# Patient Record
Sex: Female | Born: 1935 | Race: Black or African American | Hispanic: No | Smoking: Former smoker
Health system: Southern US, Community
[De-identification: ages and names within clinical notes are randomized; demographics above are authoritative.]

## PROBLEM LIST (undated history)

## (undated) DIAGNOSIS — Z789 Other specified health status: Secondary | ICD-10-CM

## (undated) DIAGNOSIS — Q600 Renal agenesis, unilateral: Secondary | ICD-10-CM

## (undated) DIAGNOSIS — J449 Chronic obstructive pulmonary disease, unspecified: Secondary | ICD-10-CM

## (undated) DIAGNOSIS — I509 Heart failure, unspecified: Secondary | ICD-10-CM

## (undated) DIAGNOSIS — I10 Essential (primary) hypertension: Secondary | ICD-10-CM

## (undated) DIAGNOSIS — M1990 Unspecified osteoarthritis, unspecified site: Secondary | ICD-10-CM

## (undated) DIAGNOSIS — I4891 Unspecified atrial fibrillation: Secondary | ICD-10-CM

## (undated) DIAGNOSIS — I272 Pulmonary hypertension, unspecified: Secondary | ICD-10-CM

## (undated) DIAGNOSIS — K219 Gastro-esophageal reflux disease without esophagitis: Secondary | ICD-10-CM

## (undated) HISTORY — DX: Unspecified atrial fibrillation: I48.91

## (undated) HISTORY — DX: Chronic obstructive pulmonary disease, unspecified: J44.9

## (undated) HISTORY — PX: ANKLE FRACTURE SURGERY: SHX122

## (undated) HISTORY — DX: Pulmonary hypertension, unspecified: I27.20

## (undated) HISTORY — PX: TOTAL KNEE ARTHROPLASTY: SHX125

## (undated) HISTORY — PX: TOTAL ABDOMINAL HYSTERECTOMY: SHX209

## (undated) HISTORY — DX: Morbid (severe) obesity due to excess calories: E66.01

## (undated) HISTORY — DX: Heart failure, unspecified: I50.9

## (undated) HISTORY — DX: Other specified health status: Z78.9

## (undated) HISTORY — DX: Unspecified osteoarthritis, unspecified site: M19.90

## (undated) HISTORY — DX: Gastro-esophageal reflux disease without esophagitis: K21.9

## (undated) HISTORY — DX: Essential (primary) hypertension: I10

---

## 2010-01-15 ENCOUNTER — Encounter: Payer: Self-pay | Admitting: Emergency Medicine

## 2010-01-15 ENCOUNTER — Encounter: Payer: Self-pay | Admitting: Internal Medicine

## 2010-01-17 ENCOUNTER — Encounter: Payer: Self-pay | Admitting: Emergency Medicine

## 2010-02-21 ENCOUNTER — Encounter: Payer: Self-pay | Admitting: Internal Medicine

## 2010-03-14 DIAGNOSIS — J449 Chronic obstructive pulmonary disease, unspecified: Secondary | ICD-10-CM

## 2010-03-14 DIAGNOSIS — I4891 Unspecified atrial fibrillation: Secondary | ICD-10-CM

## 2010-03-14 DIAGNOSIS — K219 Gastro-esophageal reflux disease without esophagitis: Secondary | ICD-10-CM

## 2010-03-14 DIAGNOSIS — I2789 Other specified pulmonary heart diseases: Secondary | ICD-10-CM

## 2010-03-14 DIAGNOSIS — I509 Heart failure, unspecified: Secondary | ICD-10-CM | POA: Insufficient documentation

## 2010-03-14 DIAGNOSIS — J4489 Other specified chronic obstructive pulmonary disease: Secondary | ICD-10-CM | POA: Insufficient documentation

## 2010-03-15 ENCOUNTER — Ambulatory Visit: Payer: Self-pay | Admitting: Emergency Medicine

## 2010-03-15 DIAGNOSIS — I10 Essential (primary) hypertension: Secondary | ICD-10-CM | POA: Insufficient documentation

## 2010-03-15 DIAGNOSIS — M129 Arthropathy, unspecified: Secondary | ICD-10-CM | POA: Insufficient documentation

## 2010-03-15 DIAGNOSIS — T7840XA Allergy, unspecified, initial encounter: Secondary | ICD-10-CM | POA: Insufficient documentation

## 2010-03-18 DIAGNOSIS — R609 Edema, unspecified: Secondary | ICD-10-CM | POA: Insufficient documentation

## 2010-03-19 ENCOUNTER — Ambulatory Visit: Payer: Self-pay | Admitting: Internal Medicine

## 2010-03-21 ENCOUNTER — Ambulatory Visit: Payer: Self-pay | Admitting: Internal Medicine

## 2010-03-21 ENCOUNTER — Ambulatory Visit: Payer: Self-pay | Admitting: Cardiology

## 2010-03-21 ENCOUNTER — Ambulatory Visit (HOSPITAL_COMMUNITY): Admission: RE | Admit: 2010-03-21 | Discharge: 2010-03-21 | Payer: Self-pay | Admitting: Internal Medicine

## 2010-03-21 ENCOUNTER — Ambulatory Visit: Payer: Self-pay

## 2010-03-21 ENCOUNTER — Encounter: Payer: Self-pay | Admitting: Internal Medicine

## 2010-03-22 ENCOUNTER — Telehealth: Payer: Self-pay | Admitting: Internal Medicine

## 2010-04-17 ENCOUNTER — Ambulatory Visit: Payer: Self-pay | Admitting: Emergency Medicine

## 2010-04-26 ENCOUNTER — Ambulatory Visit: Payer: Self-pay | Admitting: Internal Medicine

## 2010-07-29 ENCOUNTER — Ambulatory Visit (HOSPITAL_COMMUNITY): Admission: RE | Admit: 2010-07-29 | Discharge: 2010-07-29 | Payer: Self-pay | Admitting: Internal Medicine

## 2010-11-20 ENCOUNTER — Ambulatory Visit: Payer: Self-pay | Admitting: Internal Medicine

## 2010-11-20 DIAGNOSIS — I5032 Chronic diastolic (congestive) heart failure: Secondary | ICD-10-CM

## 2010-11-28 ENCOUNTER — Ambulatory Visit: Payer: Self-pay | Admitting: Internal Medicine

## 2010-11-28 LAB — CONVERTED CEMR LAB
BUN: 24 mg/dL — ABNORMAL HIGH (ref 6–23)
Calcium: 9 mg/dL (ref 8.4–10.5)
Creatinine, Ser: 1.6 mg/dL — ABNORMAL HIGH (ref 0.4–1.2)
GFR calc non Af Amer: 39.04 mL/min (ref >60)

## 2011-01-02 ENCOUNTER — Encounter: Payer: Self-pay | Admitting: Internal Medicine

## 2011-01-02 LAB — CONVERTED CEMR LAB
ALT: 26 units/L
Albumin: 3.9 g/dL
BUN: 59 mg/dL
Glucose, Bld: 91 mg/dL
HDL: 56 mg/dL
Total Bilirubin: 0.6 mg/dL
Total Protein: 8.2 g/dL

## 2011-01-03 ENCOUNTER — Encounter: Payer: Self-pay | Admitting: Internal Medicine

## 2011-01-21 NOTE — Progress Notes (Signed)
Summary: switch meds  Phone Note From Pharmacy Call back at [phone]   Caller: Stephaine/[facility]  [address] Summary of Call: cardizem cost about $ 300.00 want to switch to taztia xt 350mg  $50.00 Initial call taken by: Migdalia Dk,  March 22, 2010 10:39 AM  Follow-up for Phone Call        spoke with stephanie at [facility], okay given for pt to change to taztia.Deliah Goody, RN  March 22, 2010 11:45 AM     New/Updated Medications: TAZTIA XT 360 MG XR24H-CAP (DILTIAZEM HCL ER BEADS) one tablet by mouth once daily Prescriptions: TAZTIA XT 360 MG XR24H-CAP (DILTIAZEM HCL ER BEADS) one tablet by mouth once daily  #30 x 12   Entered by:   Deliah Goody, RN   Authorized by:   Dolores Patty, MD, [facility]   Signed by:   Deliah Goody, RN on 03/22/2010   Method used:   Electronically to        [facility] [phone]* (retail)       [address]       [place], [place]  [address]       Ph: [phone]       Fax: [phone]   RxID:   [id number]

## 2011-01-21 NOTE — Assessment & Plan Note (Signed)
Summary: 1 month rov/sl   Visit Type:   Follow-up Referring Provider:  Dr. Ronne Binning Primary Provider:  Dr Thayer Headings   History of Present Illness: Ms Grosch is a 77yo woman with a hx of A Fib, HTN and morbid obesity. Recently moved to [place] in January. Referred for evaluation of CHF and poorly controlled HTN.  Saw Dr. Delton Coombes recently. He felt like this was likely secondary pulmonary HTN due to weight and possible COPD. Offered her sleep study but she refused.   Echo EF 55-60% Mild-Mod MR. Mod-severe TR PAP 67.   BP has been in 130-140s when home nurse checks it. Says her breathing is OK but activity is limited. Recently moved from a wheelchair to walker.  No palpitations or CP. No syncope or presyncope. Frequent edema. 1-2 + orthopnea.(chronic)  No PND. No bleeidng with coumadin.    Current Medications (verified): 1)  Coumadin 5 Mg Tabs (Warfarin Sodium) .... As Directed 2)  Lasix 40 Mg Tabs (Furosemide) .Marland Kitchen.. 1 By Mouth Daily 3)  Multivitamins  Tabs (Multiple Vitamin) .Marland Kitchen.. 1 By Mouth Daily 4)  Stool Softener 100 Mg Caps (Docusate Sodium) .... Take 1 Capsule By Mouth Once A Day 5)  Ferrous Sulfate 325 (65 Fe) Mg Tabs (Ferrous Sulfate) .... Take 1 Tablet By Mouth Two Times A Day 6)  Clonidine Hcl 0.2 Mg Tabs (Clonidine Hcl) .... Take 1 Tablet By Mouth Two Times A Day 7)  Santyl 250 Unit/gm Oint (Collagenase) .... Apply As Directed 8)  Taztia Xt 360 Mg Xr24h-Cap (Diltiazem Hcl Er Beads) .... One Tablet By Mouth Once Daily  Allergies (verified): 1)  ! Asa  Vital Signs:  Patient profile:   77 year old female Height:      62 inches Weight:      218 pounds BMI:     40.02 Pulse rate:   108 / minute BP sitting:   164 / 100  (left arm)  Vitals Entered By: Laurance Flatten CMA (Apr 26, 2010 3:08 PM)  Physical Exam  General:  Obese woman sitting in W-C. No acute distress. HEENT: normal Neck: supple. thick. JVP 6. Carotids 2+ bilat; no bruits. No lymphadenopathy or thryomegaly  appreciated. Cor: PMI nonopalpable. Irregular tachycardic No rubs, gallops, murmur. Lungs: clear Abdomen: obese soft, nontender, nondistended.  Good bowel sounds. Extremities: no cyanosis, clubbing, ras. 1+ edema Neuro: alert & orientedx3, cranial nerves grossly intact. moves all 4 extremities w/o difficulty. affect pleasant    Impression & Recommendations:  Problem # 1:  ATRIAL FIBRILLATION (ICD-427.31) HR still up. Will add Lopressor 25 mg two times a day. Continue coumadin.  Problem # 2:  HYPERTENSION (ICD-401.9) BP elevated. Adding lopressor.   Problem # 3:  PULMONARY HYPERTENSION (ICD-416.8) Agree with Dr. Delton Coombes this is almost certainly 2ndary pHTN. Refuses sleep study. Needs to lose weight.   Other Orders: EKG w/ Interpretation (93000)  Patient Instructions: 1)  Start Lopressor 25mg  two times a day  2)  Follow up in 6 months Prescriptions: METOPROLOL TARTRATE 25 MG TABS (METOPROLOL TARTRATE) Take one tablet by mouth twice a day  #60 x 6   Entered by:   Meredith Staggers, RN   Authorized by:   Dolores Patty, MD, [facility]   Signed by:   Meredith Staggers, RN on 04/26/2010   Method used:   Electronically to        [facility] [phone]* (retail)       [address]  [place], [place]  [address]       Ph: [phone]       Fax: [phone]   RxID:   [id number]

## 2011-01-21 NOTE — Letter (Signed)
Summary: Pt's med hx/[facility]  Pt's med hx/[facility]   Imported By: Sherian Rein 03/21/2010 13:03:08  _____________________________________________________________________  External Attachment:    Type:   Image     Comment:   External Document

## 2011-01-21 NOTE — Miscellaneous (Signed)
Summary: Orders Update pft charges  Clinical Lists Changes  Orders: Added new Service order of Carbon Monoxide diffusing w/capacity (94720) - Signed Added new Service order of Lung Volumes (94240) - Signed Added new Service order of Spirometry w/Graph (94010) - Signed 

## 2011-01-21 NOTE — Assessment & Plan Note (Signed)
Summary: NP6/ CHF / HTN/ PT HAS MEDICARE/ GD   Referring Provider:  Dr. Ronne Binning Primary Provider:  Dr Thayer Headings  CC:  past Hx of Afib.  History of Present Illness: Desiree Pierce is a 77yo woman with a hx of A Fib, HTN and morbid obesity. Recently moved to [place] in January. Referred for evaluation of CHF and poorly controlled HTN.  Broke her ankle in October 2010 after she slipped and has essentially been in wheelchair Is able to get around a bit at home without W-C. Denies any h/o known heart disease until she was diagnosed with atrial fibrillation in November 2010. Started on coumadin. Had echo at that time. Was told she had pulmonary HTN and strated on Revatio. Never had RHC.  Saw Dr. Delton Coombes recently. He felt like this was likely secondary pulmonary HTN due to weight and possible COPD. Offered her sleep study but she refused. When she moved here a script for Revatio was given, but she has been unable to get it filled. She has been off of it since mid-January and doesn't miss it.  Saw Dr. Thea Silversmith in February and BP was 192/110. Clonidine increased. BP has improved. Take BP 2x/day. Typically 115-125/60s.Visiting also checks 2x/week and BP good for her as well.   Says her breathing is OK but activity is limited. No palpitations or CP. No syncope or presyncope. Frequent edema. 1-2 + orthopnea.(chronic)  No PND.    Current Medications (verified): 1)  Coumadin 5 Mg Tabs (Warfarin Sodium) .... As Directed 2)  Lasix 40 Mg Tabs (Furosemide) .Marland Kitchen.. 1 By Mouth Daily 3)  Multivitamins  Tabs (Multiple Vitamin) .Marland Kitchen.. 1 By Mouth Daily 4)  Stool Softener 100 Mg Caps (Docusate Sodium) .... Take 1 Capsule By Mouth Once A Day 5)  Ferrous Sulfate 325 (65 Fe) Mg Tabs (Ferrous Sulfate) .... Take 1 Tablet By Mouth Two Times A Day 6)  Clonidine Hcl 0.2 Mg Tabs (Clonidine Hcl) .... Take 1 Tablet By Mouth Two Times A Day 7)  Santyl 250 Unit/gm Oint (Collagenase) .... Apply As Directed  Allergies (verified): 1)   ! Jonne Ply  Past History:  Past Medical History: Morbid obesity Atrial fibrillation ARTHRITIS  ALLERGY  HYPERTENSION, poorly controlled  CHF  PULMONARY HYPERTENSION  G E R D  C O P D  OA  Family History: Reviewed history from 03/15/2010 and no changes required. allergies - mother asthma - father heart disease - mother DM - MGM  Social History: Reviewed history from 03/15/2010 and no changes required. smoker smoker, quit in 1970s.  x26yrs, <1 pack per week no alcohol widowed 5 children retired: Research scientist (physical sciences) originally from [place]  Review of Systems       As per HPI and past medical history; otherwise all systems negative.   Vital Signs:  Patient profile:   77 year old female Height:      62 inches Weight:      218 pounds BMI:     40.02 Pulse rate:   136 / minute BP sitting:   168 / 88  (left arm) Cuff size:   regular  Vitals Entered By: Hardin Negus, RMA (March 19, 2010 11:30 AM)  Physical Exam  General:  Obese woman sitting in W-C. No acute distress. HEENT: normal Neck: supple. thick. JVP 6. Carotids 2+ bilat; no bruits. No lymphadenopathy or thryomegaly appreciated. Cor: PMI nonopalpable. Irregular tachycardic No rubs, gallops, murmur. Lungs: clear Abdomen: obese soft, nontender, nondistended.  Good bowel sounds. Extremities: no cyanosis, clubbing,  ras. 1+ edema Neuro: alert & orientedx3, cranial nerves grossly intact. moves all 4 extremities w/o difficulty. affect pleasant    Impression & Recommendations:  Problem # 1:  ATRIAL FIBRILLATION (ICD-427.31) She is AF with RVR today. not sure if this is chronic now or paroxysmal. Discussed possibility of putting her in hospital for rate control. She prefers not to do this. Will start Cardizem 240 and see her back in 2 days. If HR not under 120 will admit her. Continue coumadin. Will likely need to consider cardioversion down the road.  Problem # 2:  HYPERTENSION (ICD-401.9) BP up here but well  controlled at home. Told her daughter that if BP drops with Cardizem may need to drop clonodone back to 0.1 two times a day.  Problem # 3:  PULMONARY HYPERTENSION (ICD-416.8) I agree with Dr. Delton Coombes. This is almost certainly 2ndary PAH due to obesity, ?COPD and probable chronicly elevated L sided pressures. No role for Revatio at this point. Will recheck echo.   Other Orders: EKG w/ Interpretation (93000) Misc. Referral (Misc. Ref) Echocardiogram (Echo)  Patient Instructions: 1)  Start Diltizem 240mg  daily 2)  Nurse Visit for EKG on Thur 3/31 3)  Your physician has requested that you have an echocardiogram.  Echocardiography is a painless test that uses sound waves to create images of your heart. It provides your doctor with information about the size and shape of your heart and how well your heart's chambers and valves are working.  This procedure takes approximately one hour. There are no restrictions for this procedure. 4)  Follow up in 1 month Prescriptions: CARDIZEM CD 240 MG XR24H-CAP (DILTIAZEM HCL COATED BEADS) Take 1 tablet by mouth once a day  #30 x 6   Entered by:   Meredith Staggers, RN   Authorized by:   Desiree Patty, MD, [facility]   Signed by:   Meredith Staggers, RN on 03/19/2010   Method used:   Electronically to        [facility] [phone]* (retail)       [address]       [place], [place]  [address]       Ph: [phone]       Fax: [phone]   RxID:   [id number]   Prevention & Chronic Care Immunizations   Influenza vaccine: Not documented    Tetanus booster: Not documented    Pneumococcal vaccine: Not documented    H. zoster vaccine: Not documented  Colorectal Screening   Hemoccult: Not documented    Colonoscopy: Not documented  Other Screening   Pap smear: Not documented    Mammogram: Not documented    DXA bone density scan: Not documented   Smoking status: Not documented  Lipids   Total Cholesterol: Not documented   LDL: Not documented    LDL Direct: Not documented   HDL: Not documented   Triglycerides: Not documented  Hypertension   Last Blood Pressure: 168 / 88  (03/19/2010)   Serum creatinine: Not documented   Serum potassium Not documented  Self-Management Support :    Hypertension self-management support: Not documented

## 2011-01-21 NOTE — Assessment & Plan Note (Signed)
Summary: ? pulm HTN and COPD   Visit Type:  Initial Consult Copy to:  Dr. Ronne Binning  CC:  pulmonary hypertension consult.  History of Present Illness: 77yo woman, new to [facility] in 1/11. Has a hx of A Fib, HTN.  She was hospitalized for a R ankle fx in 10/10. That was a complicated hospitalization which apparently indentified A fib, pulmonary HTN, possibly AFL. She was started on Advair at that time (stopped this in Jan or Feb). The workup is unavailable to me, and the pt is unclear what happened. She denies ever having a R heart cath or TTE. Was apparently started on Revatio in [place] [employer], [place], [place] When she moved here, a script for Revatio was given, but she has been unable to get it filled. She has been off of it since mid-January and doesn't miss it. Her breathing is OK. Her activity is limited by OA and knee pain. She denies any dyspnea. She was offered a PSG but didn't do it. She thinks shs has had PFTs.   Medications Prior to Update: 1)  Coumadin 5 Mg Tabs (Warfarin Sodium) .... As Directed 2)  Toprol Xl 100 Mg Xr24h-Tab (Metoprolol Succinate) .Marland Kitchen.. 1 By Mouth Two Times A Day 3)  Lasix 40 Mg Tabs (Furosemide) .Marland Kitchen.. 1 By Mouth Daily 4)  Advair Diskus 250-50 Mcg/dose Aepb (Fluticasone-Salmeterol) .Marland Kitchen.. 1 Puff Two Times A Day 5)  Multivitamins  Tabs (Multiple Vitamin) .Marland Kitchen.. 1 By Mouth Daily  Current Medications (verified): 1)  Coumadin 5 Mg Tabs (Warfarin Sodium) .... As Directed 2)  Toprol Xl 100 Mg Xr24h-Tab (Metoprolol Succinate) .Marland Kitchen.. 1 By Mouth Two Times A Day 3)  Lasix 40 Mg Tabs (Furosemide) .Marland Kitchen.. 1 By Mouth Daily 4)  Advair Diskus 250-50 Mcg/dose Aepb (Fluticasone-Salmeterol) .Marland Kitchen.. 1 Puff Two Times A Day 5)  Multivitamins  Tabs (Multiple Vitamin) .Marland Kitchen.. 1 By Mouth Daily 6)  Stool Softener 100 Mg Caps (Docusate Sodium) .... Take 1 Capsule By Mouth Once A Day 7)  Ferrous Sulfate 325 (65 Fe) Mg Tabs (Ferrous Sulfate) .... Take 1 Tablet By Mouth Two Times A Day 8)   Clonidine Hcl 0.2 Mg Tabs (Clonidine Hcl) .... Take 1 Tablet By Mouth Two Times A Day 9)  Santyl 250 Unit/gm Oint (Collagenase) .... Apply As Directed 10)  Advair Diskus 250-50 Mcg/dose Aepb (Fluticasone-Salmeterol) .... Inhale 1 Puff Two Times A Day 11)  Toprol Xl 100 Mg Xr24h-Tab (Metoprolol Succinate) .... Take 1 Tablet By Mouth Two Times A Day 12)  Viagra 25 Mg Tabs (Sildenafil Citrate) .... Take 1 Tablet By Mouth Three Times A Day  Allergies (verified): 1)  ! Asa  Past History:  Past Medical History: Current Problems:  CHF (ICD-428.0) PULMONARY HYPERTENSION (ICD-416.8) G E R D (ICD-530.81) C O P D (ICD-496) Hx of ATRIAL FIBRILLATION (ICD-427.31) Hypertension OA  Past Surgical History: TAH R knee replacement R ankle fx and repair  Family History: allergies - mother asthma - father heart disease - mother DM - MGM  Social History: smoker smoker, quit in 53s.  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 [place]  Vital Signs:  Patient profile:   77 year old female Height:      62 inches Weight:      220 pounds BMI:     40.38 O2 Sat:      96 % on Room air Temp:     97.1 degrees F oral Pulse rate:  116 / minute BP sitting:   116 / 66  (left arm) Cuff size:   regular  Vitals Entered By: Boone Master CNA (March 15, 2010 2:38 PM)  O2 Flow:  Room air  Physical Exam  General:  obese woman, sitting in wheelchair Head:  normocephalic and atraumatic Eyes:  PERRLA/EOM intact; conjunctiva and sclera clear Nose:  no deformity, discharge, inflammation, or lesions Mouth:  no deformity or lesions Neck:  no masses, thyromegaly, or abnormal cervical nodes Lungs:  clear bilaterally, no wheeze on forced exp Heart:  irreg irreg, tachycardic Abdomen:  obese, soft, NT, + BS Extremities:  1+ pitting edema, UE and fingers with arthritis changes Neurologic:  awake, alert, oriented x3, very poor memory of events and  history Skin:  intact without lesions or rashes Psych:  poor memory, oriented, and poor historian.     Impression & Recommendations:  Problem # 1:  PULMONARY HYPERTENSION (ICD-416.8)  It is unclear to me whether she has pulm HTN formerly dx, although I wouldn't be surprised if she does have OSA/OHS or chronic hypoxemia. She doesn't know what testing was done or why she was started on Revatio.  - stop the med - get records from [place] - consider TTE in future - she has been offered a PSG before, refused. This would probably be the highest yield study to perform.  - ROV to review her records  Orders: Consultation Level IV ([address])  Problem # 2:  C O P D (ICD-496) Again, not sure whether this is a real dx. She does not have a significant tobacco hx. Started on Advair when she had an illness in [place] after her ankle fx.  - full PFT - stop Advair  Medications Added to Medication List This Visit: 1)  Stool Softener 100 Mg Caps (Docusate sodium) .... Take 1 capsule by mouth once a day 2)  Ferrous Sulfate 325 (65 Fe) Mg Tabs (Ferrous sulfate) .... Take 1 tablet by mouth two times a day 3)  Clonidine Hcl 0.2 Mg Tabs (Clonidine hcl) .... Take 1 tablet by mouth two times a day 4)  Santyl 250 Unit/gm Oint (Collagenase) .... Apply as directed 5)  Advair Diskus 250-50 Mcg/dose Aepb (Fluticasone-salmeterol) .... Inhale 1 puff two times a day 6)  Toprol Xl 100 Mg Xr24h-tab (Metoprolol succinate) .... Take 1 tablet by mouth two times a day 7)  Viagra 25 Mg Tabs (Sildenafil citrate) .... Take 1 tablet by mouth three times a day  Patient Instructions: 1)  We will obtain your records from [place]. 2)  Do not restart Revatio/Viagra at this time 3)  Do not restart Advair 4)  We will perform pulmonary function testing at your next visit 5)  Depending on your records, you may need an echocardiogram. We will consider this next time.  6)  Follow up with Dr Delton Coombes in 1 month

## 2011-01-21 NOTE — Progress Notes (Signed)
Summary: Med List  Med List   Imported By: Roderic Ovens 03/27/2010 16:16:50  _____________________________________________________________________  External Attachment:    Type:   Image     Comment:   External Document

## 2011-01-21 NOTE — Letter (Signed)
Summary: [facility]   Imported By: Lester Tom Bean 03/26/2010 08:06:00  _____________________________________________________________________  External Attachment:    Type:   Image     Comment:   External Document

## 2011-01-21 NOTE — Assessment & Plan Note (Signed)
Summary: ekg/@ 4pm with heather s.  Nurse Visit   Vital Signs:  Patient profile:   77 year old female Weight:      218 pounds Pulse rate:   113 / minute BP sitting:   180 / 102  (right arm)  Vitals Entered By: Meredith Staggers, RN (March 21, 2010 4:25 PM)  Current Medications (verified): 1)  Coumadin 5 Mg Tabs (Warfarin Sodium) .... As Directed 2)  Lasix 40 Mg Tabs (Furosemide) .Marland Kitchen.. 1 By Mouth Daily 3)  Multivitamins  Tabs (Multiple Vitamin) .Marland Kitchen.. 1 By Mouth Daily 4)  Stool Softener 100 Mg Caps (Docusate Sodium) .... Take 1 Capsule By Mouth Once A Day 5)  Ferrous Sulfate 325 (65 Fe) Mg Tabs (Ferrous Sulfate) .... Take 1 Tablet By Mouth Two Times A Day 6)  Clonidine Hcl 0.2 Mg Tabs (Clonidine Hcl) .... Take 1 Tablet By Mouth Two Times A Day 7)  Santyl 250 Unit/gm Oint (Collagenase) .... Apply As Directed 8)  Cardizem Cd 240 Mg Xr24h-Cap (Diltiazem Hcl Coated Beads) .... Take 1 Tablet By Mouth Once A Day  Allergies (verified): 1)  ! Asa    Impression & Recommendations:  Problem # 1:  ATRIAL FIBRILLATION (ICD-427.31) Pt in for repeat EKG, per Dr Gala Romney increase Cardizem to 360mg  daily and repeat EKG in 1 week, pt is seeing PCP on 4/7 we gave her a prescription to have EKG there and have it faxed to [place], new rx sent in [facility], RN  March 21, 2010 4:28 PM   Prescriptions: CARDIZEM CD 360 MG XR24H-CAP (DILTIAZEM HCL COATED BEADS) Take 1 tablet by mouth once a day  #30 x 6   Entered by:   Meredith Staggers, RN   Authorized by:   Dolores Patty, MD, [facility]   Signed by:   Meredith Staggers, RN on 03/21/2010   Method used:   Electronically to        [facility] [phone]* (retail)       [address]       [place], [place]  [address]       Ph: [phone]       Fax: [phone]   RxID:   [id number]    Patient Instructions: 1)  Increase Cardizem to 360mg  daily 2)  Have EKG at PCP on 03/28/10

## 2011-01-21 NOTE — Letter (Signed)
Summary: [facility] Office Note  [facility] Office Note   Imported By: Roderic Ovens 03/27/2010 16:17:46  _____________________________________________________________________  External Attachment:    Type:   Image     Comment:   External Document

## 2011-01-21 NOTE — Assessment & Plan Note (Signed)
Summary: pulm HTN, OSA, HTN, A Fib   Visit Type:  Follow-up Copy to:  Dr. Ronne Binning Primary Provider/Referring Provider:  Dr Thayer Headings  CC:  PAH follow-up. Pt here to discuss PFT and Echo results. The patient states she has noticed no change in her breathing. No better or worse.Marland Kitchen  History of Present Illness: 77yo woman, new to [facility] in 1/11. Has a hx of A Fib, HTN.  She was hospitalized for a R ankle fx in 10/10. That was a complicated hospitalization which apparently indentified A fib, pulmonary HTN, possibly AFL. She was started on Advair at that time (stopped this in Jan or Feb). The workup is unavailable to me, and the pt is unclear what happened. She denies ever having a R heart cath or TTE. Was apparently started on Revatio in [place] [facility] [place], [place]). When she moved here, a script for Revatio was given, but she has been unable to get it filled. She has been off of it since mid-January and doesn't miss it. Her breathing is OK. Her activity is limited by OA and knee pain. She denies any dyspnea. She was offered a PSG but didn't do it. She thinks shs has had PFTs.   ROV 04/17/10 -- returns for f/u of her HTN, dyspnea, pulm HTN. Tells me that she is clinically improved. Her breathing doesn't limit her, it is her knees/OA.   Current Medications (verified): 1)  Coumadin 5 Mg Tabs (Warfarin Sodium) .... As Directed 2)  Lasix 40 Mg Tabs (Furosemide) .Marland Kitchen.. 1 By Mouth Daily 3)  Multivitamins  Tabs (Multiple Vitamin) .Marland Kitchen.. 1 By Mouth Daily 4)  Stool Softener 100 Mg Caps (Docusate Sodium) .... Take 1 Capsule By Mouth Once A Day 5)  Ferrous Sulfate 325 (65 Fe) Mg Tabs (Ferrous Sulfate) .... Take 1 Tablet By Mouth Two Times A Day 6)  Clonidine Hcl 0.2 Mg Tabs (Clonidine Hcl) .... Take 1 Tablet By Mouth Two Times A Day 7)  Santyl 250 Unit/gm Oint (Collagenase) .... Apply As Directed 8)  Taztia Xt 360 Mg Xr24h-Cap (Diltiazem Hcl Er Beads) .... One Tablet By Mouth Once  Daily  Allergies (verified): 1)  ! Asa  Vital Signs:  Patient profile:   77 year old female Height:      62 inches (157.48 cm) Weight:      218 pounds (99.09 kg) BMI:     40.02 O2 Sat:      96 % on Room air Temp:     98.2 degrees F (36.78 degrees C) oral Pulse rate:   105 / minute BP sitting:   128 / 82  (left arm) Cuff size:   large  Vitals Entered By: Michel Bickers CMA (April 17, 2010 4:21 PM)  O2 Sat at Rest %:  96 O2 Flow:  Room air  Physical Exam  General:  obese woman, sitting in wheelchair Head:  normocephalic and atraumatic Eyes:  PERRLA/EOM intact; conjunctiva and sclera clear Nose:  no deformity, discharge, inflammation, or lesions Mouth:  no deformity or lesions Neck:  no masses, thyromegaly, or abnormal cervical nodes Lungs:  clear bilaterally, no wheeze on forced exp Heart:  irreg irreg, tachycardic Abdomen:  obese, soft, NT, + BS Extremities:  1+ pitting edema, UE and fingers with arthritis changes Neurologic:  awake, alert, oriented x3, very poor memory of events and history Skin:  intact without lesions or rashes Psych:  poor memory, oriented, and poor historian.     Echocardiogram  Procedure date:  03/21/2010  Findings:      - Left ventricle: The cavity size was normal. Wall thickness was       normal. Systolic function was normal. The estimated ejection       fraction was in the range of 55% to 60%. Although no diagnostic       regional wall motion abnormality was identified, this possibility       cannot be completely excluded on the basis of this study.     - Mitral valve: Mild to moderate regurgitation.     - Left atrium: The atrium was moderately to severely dilated.     - Right atrium: The atrium was moderately dilated.     - Tricuspid valve: Moderate-severe regurgitation.     - Pulmonary arteries: Systolic pressure was severely increased. PA       peak pressure: 67mm Hg (S).  Pulmonary Function Test Date: 04/17/2010 Height (in.):  62 Gender: Female  Pre-Spirometry FVC    Value: 1.97 L/min   Pred: 2.45 L/min     % Pred: 80 % FEV1    Value: 1.48 L     Pred: 1.69 L     % Pred: 88 % FEV1/FVC  Value: 75 %     Pred: 71 %    FEF 25-75  Value: 1.18 L/min   Pred: 1.99 L/min     % Pred: 59 %  Lung Volumes TLC    Value: 3.67 L   % Pred: 84 % RV    Value: 1.63 L   % Pred: 89 % DLCO    Value: 10.7 %   % Pred: 48 % DLCO/VA  Value: 3.59 %   % Pred: 106 %  Impression & Recommendations:  Problem # 1:  PULMONARY HYPERTENSION (ICD-416.8)  Due to A Fib, HTN, probable untreated OSA. PFT's do not show COPD. TTE does confirm pulm HTN and I suspect biggest avenue to pursue is OSA.  - same medical regimen; I will defer management to Dr Gala Romney.  - offered PSG again today, she isn't interested - may benefit from walking oximetry, but her ambulation is limited by knee pain. Not sure she can perform.   Orders: Est. Patient Level IV (54270)  Problem # 2:  C O P D (ICD-496) She has tobacco hx, but PFT's don't show COPD. No inhaled meds indicated.   Patient Instructions: 1)  Please continue your current medications. 2)  We will not start inhaled medications at this time 3)  You may benefit from a sleep study at some point in the future if you are willing to do this.  4)  Follow up primarily with Dr Gala Romney to manage your medications. He will send you back to see Dr Delton Coombes if needed.

## 2011-01-21 NOTE — Letter (Signed)
Summary: [facility]   Imported By: Sherian Rein 03/21/2010 13:04:17  _____________________________________________________________________  External Attachment:    Type:   Image     Comment:   External Document

## 2011-01-21 NOTE — Assessment & Plan Note (Addendum)
Summary: f28m   Visit Type:  Follow-up Referring Provider:  Dr. Ronne Binning Primary Provider:  Dr Thayer Headings  CC:  no complaints.  History of Present Illness: Desiree Pierce is a 77yo woman with a hx of A Fib, HTN and morbid obesity. Recently moved to [place] in January. We have been following for diastolic HF and AF.   Echo EF 55-60% Mild-Mod MR. Mod-severe TR PAP 67.   Saw Dr. Delton Coombes recently. He felt like this was likely secondary pulmonary HTN due to weight and possible COPD. Offered her sleep study but she refused.   At last visit 6 months ago increased lopressor to 25 bid as her HR was 108 (in AF). Overall feeling fairly well. Not watching her diet very much. Weight up 10 pounds since we last saw her. BP has been labile. Working with Dr. Ronne Binning on this. Now SBP has been in 140-150s when home nurse checks it.   Says her breathing is OK but activity is limited. Recently moved from a wheelchair to walker.  No palpitations or CP. No syncope or presyncope. Frequent edema. 1-2 + orthopnea.(chronic)  No PND. No bleednig with coumadin.   Still snoring heavily.   Problems Prior to Update: 1)  Diastolic Heart Failure, Chronic  (ICD-428.32) 2)  Atrial Fibrillation  (ICD-427.31) 3)  Edema  (ICD-782.3) 4)  Arthritis  (ICD-716.90) 5)  Allergy  (ICD-995.3) 6)  Hypertension  (ICD-401.9) 7)  CHF  (ICD-428.0) 8)  Pulmonary Hypertension  (ICD-416.8) 9)  G E R D  (ICD-530.81) 10)  C O P D  (ICD-496) 11)  Hx of Atrial Fibrillation  (ICD-427.31)  Medications Prior to Update: 1)  Coumadin 5 Mg Tabs (Warfarin Sodium) .... As Directed 2)  Lasix 40 Mg Tabs (Furosemide) .Marland Kitchen.. 1 By Mouth Daily 3)  Multivitamins  Tabs (Multiple Vitamin) .Marland Kitchen.. 1 By Mouth Daily 4)  Stool Softener 100 Mg Caps (Docusate Sodium) .... Take 1 Capsule By Mouth Once A Day 5)  Ferrous Sulfate 325 (65 Fe) Mg Tabs (Ferrous Sulfate) .... Take 1 Tablet By Mouth Two Times A Day 6)  Clonidine Hcl 0.2 Mg Tabs (Clonidine Hcl) .... Take 1  Tablet By Mouth Two Times A Day 7)  Santyl 250 Unit/gm Oint (Collagenase) .... Apply As Directed 8)  Taztia Xt 360 Mg Xr24h-Cap (Diltiazem Hcl Er Beads) .... One Tablet By Mouth Once Daily 9)  Metoprolol Tartrate 25 Mg Tabs (Metoprolol Tartrate) .... Take One Tablet By Mouth Twice A Day  Current Medications (verified): 1)  Coumadin 5 Mg Tabs (Warfarin Sodium) .... As Directed 2)  Lasix 40 Mg Tabs (Furosemide) .Marland Kitchen.. 1 By Mouth Daily 3)  Multivitamins  Tabs (Multiple Vitamin) .Marland Kitchen.. 1 By Mouth Daily 4)  Stool Softener 100 Mg Caps (Docusate Sodium) .... Take 1 Capsule By Mouth Once A Day 5)  Clonidine Hcl 0.3 Mg Tabs (Clonidine Hcl) .... Take One Tablet By Mouth Two  Times A Day 6)  Santyl 250 Unit/gm Oint (Collagenase) .... Apply As Directed 7)  Taztia Xt 360 Mg Xr24h-Cap (Diltiazem Hcl Er Beads) .... One Tablet By Mouth Once Daily 8)  Metoprolol Tartrate 25 Mg Tabs (Metoprolol Tartrate) .... Take One Tablet By Mouth Twice A Day  Past History:  Past Medical History: Last updated: 03/19/2010 Morbid obesity Atrial fibrillation ARTHRITIS  ALLERGY  HYPERTENSION, poorly controlled  CHF  PULMONARY HYPERTENSION  G E R D  C O P D  OA  Review of Systems       As per HPI and  past medical history; otherwise all systems negative.   Vital Signs:  Patient profile:   77 year old female Height:      62 inches Weight:      228.50 pounds BMI:     41.94 Pulse rate:   63 / minute BP sitting:   154 / 86  (right arm)  Vitals Entered By: Hardin Negus, RMA (November 20, 2010 11:10 AM)  Physical Exam  General:  Obese woman sitting in W-C. No acute distress. HEENT: normal Neck: supple. thick. JVP hard to see.. Carotids 2+ bilat; no bruits. No lymphadenopathy or thryomegaly appreciated. Cor: PMI nonopalpable. Irregular tachycardic No rubs, gallops, murmur. Lungs: clear Abdomen: obese soft, nontender, nondistended.  Good bowel sounds. Extremities: no cyanosis, clubbing, ras. 1-2+ edema  R>L Neuro: alert & orientedx3, cranial nerves grossly intact. moves all 4 extremities w/o difficulty. affect pleasant    Impression & Recommendations:  Problem # 1:  DIASTOLIC HEART FAILURE, CHRONIC (ICD-428.32) Volume status mildly up. Will start spironolactone 25 once daily to help with BP and volume status. Check BMET and BNP today and in 1 week.   Problem # 2:  ATRIAL FIBRILLATION (ICD-427.31) Now rate controlled. Continue coumadin.   Problem # 3:  HYPERTENSION (ICD-401.9) BP up. Adding spiro. F/u with Dr. Thea Silversmith.   Problem # 4:  PULMONARY HYPERTENSION (ICD-416.8) Likely secondary pHTN. Needs sleeps study but continues to refuse due to clostrophobia and inability to wear mask.   Other Orders: EKG w/ Interpretation (93000) TLB-BMP (Basic Metabolic Panel-BMET) (80048-METABOL) TLB-BNP (B-Natriuretic Peptide) (83880-BNPR)  Patient Instructions: 1)  Start Spironolactone 25mg  daily 2)  Your physician recommends that you return for lab work in: TODAY AND IN 1 WEEK (bmet, bnp 428.33) 3)  Your physician wants you to follow-up in:  6 months.  You will receive a reminder letter in the mail two months in advance. If you don't receive a letter, please call our office to schedule the follow-up appointment. Prescriptions: SPIRONOLACTONE 25 MG TABS (SPIRONOLACTONE) Take one tablet by mouth daily  #90 x 3   Entered by:   Meredith Staggers, RN   Authorized by:   Dolores Patty, MD, [facility]   Signed by:   Meredith Staggers, RN on 11/20/2010   Method used:   Faxed to ...       [facility] (mail-order)             , [place]         Ph: [phone]       Fax: [phone]   RxID:   [id number] SPIRONOLACTONE 25 MG TABS (SPIRONOLACTONE) Take one tablet by mouth daily  #30 x 0   Entered by:   Meredith Staggers, RN   Authorized by:   Dolores Patty, MD, [facility]   Signed by:   Meredith Staggers, RN on 11/20/2010   Method used:   Electronically to        [facility]. [phone]* (retail)       [address]       [place], [place]  [address]       Ph: [phone]       Fax: [phone]   RxID:   [id number]   Appended Document: f68m I assume her PCP is taking care of UTI. Did they start her on a statin? If not, would prescribe atorvastatin 20

## 2011-04-15 ENCOUNTER — Encounter: Payer: Self-pay | Admitting: Internal Medicine

## 2011-05-21 ENCOUNTER — Encounter: Payer: Self-pay | Admitting: Internal Medicine

## 2011-05-29 ENCOUNTER — Encounter: Payer: Self-pay | Admitting: Internal Medicine

## 2011-05-29 ENCOUNTER — Ambulatory Visit (INDEPENDENT_AMBULATORY_CARE_PROVIDER_SITE_OTHER): Payer: Medicare Other | Admitting: Internal Medicine

## 2011-05-29 VITALS — BP 156/88 | HR 81 | Ht 62.5 in | Wt 207.8 lb

## 2011-05-29 DIAGNOSIS — I5032 Chronic diastolic (congestive) heart failure: Secondary | ICD-10-CM

## 2011-05-29 DIAGNOSIS — I509 Heart failure, unspecified: Secondary | ICD-10-CM

## 2011-05-29 MED ORDER — FUROSEMIDE 40 MG PO TABS: 40.0000 mg | ORAL_TABLET | Freq: Two times a day (BID) | ORAL | Status: DC

## 2011-05-29 NOTE — Assessment & Plan Note (Signed)
Chronic. Rate controlled. Continue coumadin. 

## 2011-05-29 NOTE — Patient Instructions (Signed)
Your physician has recommended you make the following change in your medication: Increase furosemide to twice a day  Your physician recommends that you return for lab work in: Thursday June 14,12 Your physician recommends that you schedule a follow-up appointment in: 3 months with Dr. Gala Romney

## 2011-05-29 NOTE — Progress Notes (Signed)
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 Recently moved to [place] in January. We have been following for diastolic HF and AF.   Echo EF 55-60% Mild-Mod MR. Mod-severe TR PAP 67.   Saw Dr. Delton Coombes who felt like this was likely secondary pulmonary HTN due to weight and possible COPD. Offered her sleep study but she refused.   At last visit I added spironolactone. Swelling got much better but potassium went up to almost 7 and it was stopped. Since that time has had mildly increased swelling in both legs L > R. Also having in LLE and saw ortho and had x-rays and said it was arthritis.   Says her breathing is OK - no change. Gets around walker.  No palpitations or CP. No syncope or presyncope. No PND. No bleeding with coumadin. BP at home well controlled.     ROS: All systems negative except as listed in HPI, PMH and Problem List.  Past Medical History  Diagnosis Date  . Morbid obesity   . Atrial fibrillation   . Arthritis   . Allergy history unknown   . Hypertension     Poorly controlled  . CHF (congestive heart failure)   . Pulmonary hypertension   . GERD (gastroesophageal reflux disease)   . COPD (chronic obstructive pulmonary disease)   . OA (osteoarthritis)     Current Outpatient Prescriptions  Medication Sig Dispense Refill  . Casanthranol-Docusate Sodium 30-100 MG CAPS Take 1 capsule by mouth daily.        . cloNIDine (CATAPRES) 0.3 MG tablet Take 0.3 mg by mouth daily.       . collagenase (SANTYL) ointment Apply topically as directed.        . diltiazem (TIAZAC) 360 MG 24 hr capsule Take 360 mg by mouth daily.        . furosemide (LASIX) 40 MG tablet Take 40 mg by mouth daily.        . metoprolol tartrate (LOPRESSOR) 25 MG tablet Take 25 mg by mouth daily.        . Multiple Vitamin (MULTIVITAMIN) tablet Take 1 tablet by mouth daily.        Marland Kitchen warfarin (COUMADIN) 5 MG tablet Take 5 mg by mouth as  directed.       Marland Kitchen DISCONTD: metoprolol succinate (TOPROL-XL) 25 MG 24 hr tablet Take 25 mg by mouth 2 (two) times daily.        Marland Kitchen DISCONTD: spironolactone (ALDACTONE) 25 MG tablet Take 25 mg by mouth daily.           PHYSICAL EXAM: Filed Vitals:   05/29/11 1545  BP: 156/88  Pulse: 81  General:  Obese woman sitting on walker. No acute distress. HEENT: normal Neck: supple. thick. JVP hard to see.. Carotids 2+ bilat; no bruits. No lymphadenopathy or thryomegaly appreciated. Cor: PMI nonopalpable. Irregular tachycardic No rubs, gallops, murmur. Lungs: clear Abdomen: obese soft, nontender, nondistended.  Good bowel sounds. Extremities: no cyanosis, clubbing, rash 1-2+ edema L>R Neuro: alert & orientedx3, cranial nerves grossly intact. moves all 4 extremities w/o difficulty. affect pleasant   ASSESSMENT & PLAN:

## 2011-05-29 NOTE — Assessment & Plan Note (Signed)
Mild volume overload. Will try to increase lasix to 40 bid and see how she tolerates. Follow potassium and renal function closely. BP up mildly here but says overall it has been very well controlled.

## 2011-06-05 ENCOUNTER — Other Ambulatory Visit (INDEPENDENT_AMBULATORY_CARE_PROVIDER_SITE_OTHER): Payer: Medicare Other | Admitting: *Deleted

## 2011-06-05 DIAGNOSIS — I5032 Chronic diastolic (congestive) heart failure: Secondary | ICD-10-CM

## 2011-06-05 DIAGNOSIS — I509 Heart failure, unspecified: Secondary | ICD-10-CM

## 2011-06-06 LAB — BASIC METABOLIC PANEL
BUN: 48 mg/dL — ABNORMAL HIGH (ref 6–23)
CO2: 31 mEq/L (ref 19–32)
Calcium: 9.2 mg/dL (ref 8.4–10.5)
Creatinine, Ser: 2.2 mg/dL — ABNORMAL HIGH (ref 0.4–1.2)
Glucose, Bld: 130 mg/dL — ABNORMAL HIGH (ref 70–99)
Potassium: 3.8 mEq/L (ref 3.5–5.1)

## 2011-07-30 ENCOUNTER — Ambulatory Visit: Payer: Medicare Other | Attending: Internal Medicine | Admitting: Physical Therapy

## 2011-07-30 DIAGNOSIS — M25569 Pain in unspecified knee: Secondary | ICD-10-CM | POA: Insufficient documentation

## 2011-07-30 DIAGNOSIS — R5381 Other malaise: Secondary | ICD-10-CM | POA: Insufficient documentation

## 2011-07-30 DIAGNOSIS — M6281 Muscle weakness (generalized): Secondary | ICD-10-CM | POA: Insufficient documentation

## 2011-07-30 DIAGNOSIS — IMO0001 Reserved for inherently not codable concepts without codable children: Secondary | ICD-10-CM | POA: Insufficient documentation

## 2011-07-30 DIAGNOSIS — R262 Difficulty in walking, not elsewhere classified: Secondary | ICD-10-CM | POA: Insufficient documentation

## 2011-08-05 ENCOUNTER — Ambulatory Visit: Payer: Medicare Other | Admitting: Physical Therapy

## 2011-08-07 ENCOUNTER — Ambulatory Visit: Payer: Medicare Other | Admitting: Physical Therapy

## 2011-08-11 ENCOUNTER — Encounter: Payer: Medicare Other | Admitting: Physical Therapy

## 2011-08-11 ENCOUNTER — Ambulatory Visit: Payer: Medicare Other | Admitting: Physical Therapy

## 2011-08-14 ENCOUNTER — Ambulatory Visit: Payer: Medicare Other | Admitting: Rehabilitation

## 2011-08-18 ENCOUNTER — Ambulatory Visit: Payer: Medicare Other | Admitting: Physical Therapy

## 2011-08-21 ENCOUNTER — Ambulatory Visit: Payer: Medicare Other | Admitting: Physical Therapy

## 2011-08-28 ENCOUNTER — Ambulatory Visit: Payer: Medicare Other | Attending: Internal Medicine | Admitting: Physical Therapy

## 2011-08-28 DIAGNOSIS — R262 Difficulty in walking, not elsewhere classified: Secondary | ICD-10-CM | POA: Insufficient documentation

## 2011-08-28 DIAGNOSIS — M25569 Pain in unspecified knee: Secondary | ICD-10-CM | POA: Insufficient documentation

## 2011-08-28 DIAGNOSIS — M6281 Muscle weakness (generalized): Secondary | ICD-10-CM | POA: Insufficient documentation

## 2011-08-28 DIAGNOSIS — IMO0001 Reserved for inherently not codable concepts without codable children: Secondary | ICD-10-CM | POA: Insufficient documentation

## 2011-08-28 DIAGNOSIS — R5381 Other malaise: Secondary | ICD-10-CM | POA: Insufficient documentation

## 2011-09-03 ENCOUNTER — Ambulatory Visit: Payer: Medicare Other | Admitting: Physical Therapy

## 2011-09-05 ENCOUNTER — Ambulatory Visit: Payer: Medicare Other | Admitting: Physical Therapy

## 2011-09-08 ENCOUNTER — Encounter: Payer: Medicare Other | Admitting: Physical Therapy

## 2011-09-09 ENCOUNTER — Ambulatory Visit: Payer: Medicare Other | Admitting: Internal Medicine

## 2011-09-11 ENCOUNTER — Encounter: Payer: Medicare Other | Admitting: Physical Therapy

## 2011-09-16 ENCOUNTER — Ambulatory Visit: Payer: Medicare Other | Admitting: Physical Therapy

## 2011-09-18 ENCOUNTER — Encounter: Payer: Medicare Other | Admitting: Physical Therapy

## 2011-09-23 ENCOUNTER — Ambulatory Visit: Payer: Medicare Other | Attending: Internal Medicine | Admitting: Physical Therapy

## 2011-09-23 DIAGNOSIS — M6281 Muscle weakness (generalized): Secondary | ICD-10-CM | POA: Insufficient documentation

## 2011-09-23 DIAGNOSIS — R262 Difficulty in walking, not elsewhere classified: Secondary | ICD-10-CM | POA: Insufficient documentation

## 2011-09-23 DIAGNOSIS — R5381 Other malaise: Secondary | ICD-10-CM | POA: Insufficient documentation

## 2011-09-23 DIAGNOSIS — M25569 Pain in unspecified knee: Secondary | ICD-10-CM | POA: Insufficient documentation

## 2011-09-23 DIAGNOSIS — IMO0001 Reserved for inherently not codable concepts without codable children: Secondary | ICD-10-CM | POA: Insufficient documentation

## 2011-09-25 ENCOUNTER — Ambulatory Visit: Payer: Medicare Other | Admitting: Physical Therapy

## 2011-10-06 ENCOUNTER — Ambulatory Visit: Payer: Medicare Other | Admitting: Physical Therapy

## 2011-10-09 ENCOUNTER — Ambulatory Visit: Payer: Medicare Other | Admitting: Physical Therapy

## 2011-10-15 ENCOUNTER — Ambulatory Visit: Payer: Medicare Other | Admitting: Physical Therapy

## 2011-10-17 ENCOUNTER — Ambulatory Visit: Payer: Medicare Other | Admitting: Physical Therapy

## 2011-10-21 ENCOUNTER — Ambulatory Visit: Payer: Medicare Other | Admitting: Rehabilitation

## 2011-10-22 ENCOUNTER — Ambulatory Visit: Payer: Medicare Other | Admitting: Physical Therapy

## 2011-10-23 DEATH — deceased

## 2011-10-27 ENCOUNTER — Encounter: Payer: Medicare Other | Admitting: Physical Therapy

## 2011-10-27 ENCOUNTER — Ambulatory Visit (HOSPITAL_COMMUNITY)
Admission: RE | Admit: 2011-10-27 | Discharge: 2011-10-27 | Disposition: A | Discharge status: Home / Self Care | Payer: Medicare Other | Source: Ambulatory Visit | Attending: Internal Medicine | Admitting: Internal Medicine

## 2011-10-27 VITALS — BP 146/90 | HR 68 | Wt 199.5 lb

## 2011-10-27 DIAGNOSIS — R0600 Dyspnea, unspecified: Secondary | ICD-10-CM

## 2011-10-27 DIAGNOSIS — I509 Heart failure, unspecified: Secondary | ICD-10-CM

## 2011-10-27 DIAGNOSIS — R0609 Other forms of dyspnea: Secondary | ICD-10-CM | POA: Insufficient documentation

## 2011-10-27 DIAGNOSIS — R0989 Other specified symptoms and signs involving the circulatory and respiratory systems: Secondary | ICD-10-CM | POA: Insufficient documentation

## 2011-10-27 DIAGNOSIS — I4891 Unspecified atrial fibrillation: Secondary | ICD-10-CM | POA: Insufficient documentation

## 2011-10-27 DIAGNOSIS — I5032 Chronic diastolic (congestive) heart failure: Secondary | ICD-10-CM

## 2011-10-27 LAB — BASIC METABOLIC PANEL
Calcium: 10 mg/dL (ref 8.4–10.5)
Creatinine, Ser: 1.65 mg/dL — ABNORMAL HIGH (ref 0.50–1.10)
GFR calc Af Amer: 33 mL/min — ABNORMAL LOW (ref >90)
GFR calc non Af Amer: 29 mL/min — ABNORMAL LOW (ref >90)
Glucose, Bld: 101 mg/dL — ABNORMAL HIGH (ref 70–99)
Potassium: 3.8 mEq/L (ref 3.5–5.1)

## 2011-10-27 NOTE — Patient Instructions (Addendum)
Continue current medications.  May take an extra dose of lasix 40 mg on days that weight is increasing greater than 3 pounds.   Weight yourself each morning and record daily.    Get labs today.    Your physician has requested that you have an echocardiogram. Echocardiography is a painless test that uses sound waves to create images of your heart. It provides your doctor with information about the size and shape of your heart and how well your heart's chambers and valves are working. This procedure takes approximately one hour. There are no restrictions for this procedure.  Follow up with Dr. Gala Romney in 2 months, call prior to appointment if there are any questions or concerns.

## 2011-10-27 NOTE — Assessment & Plan Note (Signed)
Chronic. Rate controlled. Continue coumadin. 

## 2011-10-27 NOTE — Assessment & Plan Note (Addendum)
Mild volume overload.  Venous pressure appears mildly elevated but unsure if this is right or left sided she is asymptomatic and weights are trending down, will check echo to assess.  Will continue lasix 40 bid, discussed sliding scale lasix.  Check labs today.   Patient seen and examined with Ulyess Blossom PA-C. We discussed all aspects of the encounter. I agree with the assessment and plan as stated above. Overall doing well. Hard to assess volume status. Agree with repeat echo to look at pulmonzry pressures. Reinforced diuretic sliding scale.

## 2011-10-27 NOTE — Progress Notes (Signed)
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 Recently moved to [place] in January. We have been following for diastolic HF and AF.   Echo EF 55-60% Mild-Mod MR. Mod-severe TR PAP 67.   Saw Dr. Delton Coombes who felt like this was likely secondary pulmonary HTN due to weight and possible COPD. Offered her sleep study but she refused.   At last visit I added spironolactone. Swelling got much better but potassium went up to almost 7 and it was stopped. Since that time has had mildly increased swelling in both legs L > R. Also having in LLE and saw ortho and had x-rays and said it was arthritis.   She returns for follow up today.  Since the last visit pt had difficulty with swelling in her throat/tongue and found to have an INR of >10.  She was admitted to [facility] and is following closely with Dr. Ronne Binning and INRs have been well controlled.  She says she is doing pretty good.  Her breathing is ok without change.  She continues to get around with a walker due to her arthritis.  She is currently is rehab for her knees twice a week.  She is able to complete rehab without difficulty.  She has occasional dizziness but no syncope.  She denies orthopnea/PND.  No further problems.      ROS: All systems negative except as listed in HPI, PMH and Problem List.  Past Medical History  Diagnosis Date  . Morbid obesity   . Atrial fibrillation   . Arthritis   . Allergy history unknown   . Hypertension     Poorly controlled  . CHF (congestive heart failure)   . Pulmonary hypertension   . GERD (gastroesophageal reflux disease)   . COPD (chronic obstructive pulmonary disease)   . OA (osteoarthritis)     Current Outpatient Prescriptions  Medication Sig Dispense Refill  . Casanthranol-Docusate Sodium 30-100 MG CAPS Take 1 capsule by mouth daily.        . cloNIDine (CATAPRES) 0.3 MG tablet Take 0.3 mg by mouth daily.       .  collagenase (SANTYL) ointment Apply topically as directed.        . diltiazem (TIAZAC) 360 MG 24 hr capsule Take 360 mg by mouth daily.        . furosemide (LASIX) 40 MG tablet Take 1 tablet (40 mg total) by mouth 2 (two) times daily.  180 tablet  3  . metoprolol tartrate (LOPRESSOR) 25 MG tablet Take 25 mg by mouth 2 (two) times daily.       . Multiple Vitamin (MULTIVITAMIN) tablet Take 1 tablet by mouth daily.        Marland Kitchen warfarin (COUMADIN) 5 MG tablet Take 5 mg by mouth as directed.          PHYSICAL EXAM: Filed Vitals:   10/27/11 1134  BP: 146/90  Pulse: 68  Wt 199  General:  Obese woman sitting on walker. No acute distress. HEENT: normal Neck: supple. thick. JVP to earlobes.  Carotids 2+ bilat; no bruits. No lymphadenopathy or thryomegaly appreciated. Cor: PMI nonopalpable. Irregular tachycardic No rubs, gallops, murmur. Lungs: clear Abdomen: obese soft, nontender, nondistended.  Good bowel sounds. Extremities: no cyanosis, clubbing, Trace to 1+ edema L>R Neuro: alert & orientedx3, cranial nerves grossly intact. moves all 4 extremities w/o difficulty. affect pleasant   ASSESSMENT & PLAN:

## 2011-10-29 ENCOUNTER — Ambulatory Visit (HOSPITAL_COMMUNITY)
Admission: RE | Admit: 2011-10-29 | Discharge: 2011-10-29 | Disposition: A | Discharge status: Home / Self Care | Payer: Medicare Other | Source: Ambulatory Visit | Attending: Internal Medicine | Admitting: Internal Medicine

## 2011-10-29 DIAGNOSIS — I4891 Unspecified atrial fibrillation: Secondary | ICD-10-CM | POA: Insufficient documentation

## 2011-10-29 DIAGNOSIS — J449 Chronic obstructive pulmonary disease, unspecified: Secondary | ICD-10-CM | POA: Insufficient documentation

## 2011-10-29 DIAGNOSIS — I5032 Chronic diastolic (congestive) heart failure: Secondary | ICD-10-CM

## 2011-10-29 DIAGNOSIS — J4489 Other specified chronic obstructive pulmonary disease: Secondary | ICD-10-CM | POA: Insufficient documentation

## 2011-10-29 DIAGNOSIS — Z87891 Personal history of nicotine dependence: Secondary | ICD-10-CM | POA: Insufficient documentation

## 2011-10-29 DIAGNOSIS — I369 Nonrheumatic tricuspid valve disorder, unspecified: Secondary | ICD-10-CM

## 2011-10-29 DIAGNOSIS — I10 Essential (primary) hypertension: Secondary | ICD-10-CM | POA: Insufficient documentation

## 2011-10-29 NOTE — Progress Notes (Signed)
  Echocardiogram 2D Echocardiogram has been performed.  HAIRSTON, Nafisah Runions 10/29/2011, 3:00 PM

## 2011-10-30 ENCOUNTER — Ambulatory Visit: Payer: Medicare Other | Attending: Internal Medicine | Admitting: Physical Therapy

## 2011-10-30 DIAGNOSIS — R5381 Other malaise: Secondary | ICD-10-CM | POA: Insufficient documentation

## 2011-10-30 DIAGNOSIS — M25569 Pain in unspecified knee: Secondary | ICD-10-CM | POA: Insufficient documentation

## 2011-10-30 DIAGNOSIS — IMO0001 Reserved for inherently not codable concepts without codable children: Secondary | ICD-10-CM | POA: Insufficient documentation

## 2011-10-30 DIAGNOSIS — R262 Difficulty in walking, not elsewhere classified: Secondary | ICD-10-CM | POA: Insufficient documentation

## 2011-10-30 DIAGNOSIS — M6281 Muscle weakness (generalized): Secondary | ICD-10-CM | POA: Insufficient documentation

## 2011-11-05 ENCOUNTER — Ambulatory Visit: Payer: Medicare Other | Admitting: Rehabilitation

## 2011-11-13 NOTE — Progress Notes (Signed)
Patient seen and examined with Ulyess Blossom PA-C. We discussed all aspects of the encounter. I agree with the assessment and plan as stated below. See my notes.

## 2011-11-26 ENCOUNTER — Encounter (HOSPITAL_COMMUNITY): Payer: Self-pay | Admitting: *Deleted

## 2011-12-29 ENCOUNTER — Encounter (HOSPITAL_COMMUNITY): Payer: Medicare Other

## 2012-01-26 ENCOUNTER — Ambulatory Visit (HOSPITAL_COMMUNITY): Payer: Medicare Other

## 2012-02-03 ENCOUNTER — Ambulatory Visit: Payer: Medicare Other | Attending: Internal Medicine | Admitting: Physical Therapy

## 2012-02-03 DIAGNOSIS — M6281 Muscle weakness (generalized): Secondary | ICD-10-CM | POA: Insufficient documentation

## 2012-02-03 DIAGNOSIS — M25569 Pain in unspecified knee: Secondary | ICD-10-CM | POA: Insufficient documentation

## 2012-02-03 DIAGNOSIS — IMO0001 Reserved for inherently not codable concepts without codable children: Secondary | ICD-10-CM | POA: Insufficient documentation

## 2012-02-03 DIAGNOSIS — R262 Difficulty in walking, not elsewhere classified: Secondary | ICD-10-CM | POA: Insufficient documentation

## 2012-02-03 DIAGNOSIS — R5381 Other malaise: Secondary | ICD-10-CM | POA: Insufficient documentation

## 2012-02-06 ENCOUNTER — Ambulatory Visit (HOSPITAL_COMMUNITY)
Admission: RE | Admit: 2012-02-06 | Discharge: 2012-02-06 | Disposition: A | Discharge status: Home / Self Care | Payer: Medicare Other | Source: Ambulatory Visit | Attending: Internal Medicine | Admitting: Internal Medicine

## 2012-02-06 VITALS — BP 120/70 | HR 87 | Wt 188.5 lb

## 2012-02-06 DIAGNOSIS — I509 Heart failure, unspecified: Secondary | ICD-10-CM

## 2012-02-06 DIAGNOSIS — I5032 Chronic diastolic (congestive) heart failure: Secondary | ICD-10-CM

## 2012-02-06 MED ORDER — POTASSIUM CHLORIDE ER 10 MEQ PO TBCR: EXTENDED_RELEASE_TABLET | ORAL | Status: DC

## 2012-02-06 MED ORDER — METOLAZONE 2.5 MG PO TABS: ORAL_TABLET | ORAL | Status: DC

## 2012-02-06 NOTE — Patient Instructions (Addendum)
Take Metolazone 2.5 mg today and tomorrow then every Friday  Take K-Dur 20 meq daily when you take Metolazone.  Please obtain lab work in 2 weeks and fax to heart failure [facility] [phone]   Follow up in 4 weeks

## 2012-02-07 NOTE — Progress Notes (Signed)
Patient ID: Desiree Pierce, female   DOB: April 12, 1931, 78 y.o.   MRN: [id number] Patient ID: Desiree Pierce, female   DOB: 1931/10/19, 78 y.o.   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 Recently moved to [place] in January. We have been following for diastolic HF and AF.   Echo EF 55-60% Mild-Mod MR. Mod-severe TR PAP 67.   Saw Dr. Delton Coombes who felt like this was likely secondary pulmonary HTN due to weight and possible COPD. Offered her sleep study but she refused.   At last visit I added spironolactone. Swelling got much better but potassium went up to almost 7 and it was stopped. Since that time has had mildly increased swelling in both legs L > R. Also having in LLE and saw ortho and had x-rays and said it was arthritis.   She returns for follow up today. Coumadin followed by Dr Bray/Dr Ronne Binning. Sleeps on 2 pillows.   She does not weigh daily.  Her breathing is ok without change.  She continues to get around with a walker due to her arthritis.   She has occasional dizziness but no syncope.   She denies orthopnea/PND.      ROS: All systems negative except as listed in HPI, PMH and Problem List.  Past Medical History  Diagnosis Date  . Morbid obesity   . Atrial fibrillation   . Arthritis   . Allergy history unknown   . Hypertension     Poorly controlled  . CHF (congestive heart failure)   . Pulmonary hypertension   . GERD (gastroesophageal reflux disease)   . COPD (chronic obstructive pulmonary disease)   . OA (osteoarthritis)     Current Outpatient Prescriptions  Medication Sig Dispense Refill  . Casanthranol-Docusate Sodium 30-100 MG CAPS Take 1 capsule by mouth daily.        . cloNIDine (CATAPRES) 0.3 MG tablet Take 0.3 mg by mouth daily.       Marland Kitchen diltiazem (TIAZAC) 360 MG 24 hr capsule Take 360 mg by mouth daily.        . furosemide (LASIX) 40 MG tablet Take 1 tablet (40 mg total) by mouth 2  (two) times daily.  180 tablet  3  . metoprolol tartrate (LOPRESSOR) 25 MG tablet Take 25 mg by mouth 2 (two) times daily.       . Multiple Vitamin (MULTIVITAMIN) tablet Take 1 tablet by mouth daily.        Marland Kitchen warfarin (COUMADIN) 5 MG tablet Take 5 mg by mouth as directed.       . metolazone (ZAROXOLYN) 2.5 MG tablet Take once a week. Or as directed by physician  10 tablet  6  . potassium chloride (K-DUR) 10 MEQ tablet Take 1 tab every time you take Metolazone.  30 tablet  6     PHYSICAL EXAM: Filed Vitals:   02/06/12 1121  BP: 120/70  Pulse: 87  Wt 188 (199)  General:  Obese woman sitting on walker. No acute distress. HEENT: normal Neck: supple. thick. JVP to earlobes.  Carotids 2+ bilat; no bruits. No lymphadenopathy or thryomegaly appreciated. Cor: PMI nonopalpable. Irregular tachycardic No rubs, gallops, murmur. Lungs: clear Abdomen: obese soft, nontender, nondistended.  Good bowel sounds. Extremities: no cyanosis, clubbing, RLE  2+ LLE 3+ edema Neuro: alert & orientedx3, cranial nerves grossly intact. moves all 4 extremities w/o difficulty. affect pleasant   ASSESSMENT &  PLAN:

## 2012-02-07 NOTE — Assessment & Plan Note (Signed)
Chronic NYHA III. Volume status significantly elevated. Will have her take Metolazone 2.5 mg today and tomorrow then every Friday. Follow renal function closely. Reinforced need for daily weights and dietary discretion at length. Time spent 35 minutes.

## 2012-02-10 ENCOUNTER — Ambulatory Visit: Payer: Medicare Other | Admitting: Physical Therapy

## 2012-02-13 ENCOUNTER — Ambulatory Visit: Payer: Medicare Other | Admitting: Physical Therapy

## 2012-02-16 ENCOUNTER — Telehealth (HOSPITAL_COMMUNITY): Payer: Self-pay | Admitting: *Deleted

## 2012-02-16 NOTE — Telephone Encounter (Signed)
Spoke w/pt she hasn't received her metolazone and kcl, but she spoke w/[facility] and it has been straightened out and they are sending it to her quickly,

## 2012-02-16 NOTE — Telephone Encounter (Signed)
Call patient in regards to prescriptions - metolazone & potassium cholride

## 2012-02-17 ENCOUNTER — Ambulatory Visit: Payer: Medicare Other | Admitting: Physical Therapy

## 2012-02-19 ENCOUNTER — Ambulatory Visit: Payer: Medicare Other | Admitting: Physical Therapy

## 2012-02-24 ENCOUNTER — Ambulatory Visit: Payer: Medicare Other | Attending: Internal Medicine | Admitting: Physical Therapy

## 2012-02-24 DIAGNOSIS — M25569 Pain in unspecified knee: Secondary | ICD-10-CM | POA: Insufficient documentation

## 2012-02-24 DIAGNOSIS — IMO0001 Reserved for inherently not codable concepts without codable children: Secondary | ICD-10-CM | POA: Insufficient documentation

## 2012-02-24 DIAGNOSIS — M6281 Muscle weakness (generalized): Secondary | ICD-10-CM | POA: Insufficient documentation

## 2012-02-24 DIAGNOSIS — R262 Difficulty in walking, not elsewhere classified: Secondary | ICD-10-CM | POA: Insufficient documentation

## 2012-02-24 DIAGNOSIS — R5381 Other malaise: Secondary | ICD-10-CM | POA: Insufficient documentation

## 2012-02-26 ENCOUNTER — Ambulatory Visit: Payer: Medicare Other | Admitting: Physical Therapy

## 2012-02-27 ENCOUNTER — Telehealth (HOSPITAL_COMMUNITY): Payer: Self-pay | Admitting: Anesthesiology

## 2012-02-27 ENCOUNTER — Other Ambulatory Visit (HOSPITAL_COMMUNITY): Payer: Self-pay | Admitting: Anesthesiology

## 2012-02-27 DIAGNOSIS — I5022 Chronic systolic (congestive) heart failure: Secondary | ICD-10-CM

## 2012-02-27 NOTE — Telephone Encounter (Signed)
Daughter aware of lab results and told to hold her mothers lasix x 2 days and stop metalozone completely. Recheck BMET 3/15 @11 :30. Call if any questions.

## 2012-03-02 ENCOUNTER — Ambulatory Visit: Payer: Medicare Other | Admitting: Physical Therapy

## 2012-03-04 ENCOUNTER — Ambulatory Visit: Payer: Medicare Other | Admitting: Physical Therapy

## 2012-03-05 ENCOUNTER — Encounter (HOSPITAL_COMMUNITY): Payer: Medicare Other

## 2012-03-05 ENCOUNTER — Other Ambulatory Visit (INDEPENDENT_AMBULATORY_CARE_PROVIDER_SITE_OTHER): Payer: Medicare Other

## 2012-03-05 DIAGNOSIS — I5022 Chronic systolic (congestive) heart failure: Secondary | ICD-10-CM

## 2012-03-05 LAB — BASIC METABOLIC PANEL
BUN: 48 mg/dL — ABNORMAL HIGH (ref 6–23)
CO2: 29 mEq/L (ref 19–32)
Chloride: 102 mEq/L (ref 96–112)
Creatinine, Ser: 1.9 mg/dL — ABNORMAL HIGH (ref 0.4–1.2)
Potassium: 3.5 mEq/L (ref 3.5–5.1)
Sodium: 144 mEq/L (ref 135–145)

## 2012-03-09 ENCOUNTER — Ambulatory Visit: Payer: Medicare Other | Admitting: Physical Therapy

## 2012-03-10 ENCOUNTER — Ambulatory Visit (HOSPITAL_COMMUNITY)
Admission: RE | Admit: 2012-03-10 | Discharge: 2012-03-10 | Disposition: A | Discharge status: Home / Self Care | Payer: Medicare Other | Source: Ambulatory Visit | Attending: Internal Medicine | Admitting: Internal Medicine

## 2012-03-10 VITALS — BP 146/84 | HR 95 | Wt 200.5 lb

## 2012-03-10 DIAGNOSIS — I4891 Unspecified atrial fibrillation: Secondary | ICD-10-CM

## 2012-03-10 DIAGNOSIS — I5032 Chronic diastolic (congestive) heart failure: Secondary | ICD-10-CM | POA: Insufficient documentation

## 2012-03-10 DIAGNOSIS — I509 Heart failure, unspecified: Secondary | ICD-10-CM

## 2012-03-10 MED ORDER — FUROSEMIDE 40 MG PO TABS: ORAL_TABLET | ORAL | Status: DC

## 2012-03-10 MED ORDER — POTASSIUM CHLORIDE ER 10 MEQ PO TBCR: 20.0000 meq | EXTENDED_RELEASE_TABLET | Freq: Every day | ORAL | Status: DC

## 2012-03-10 MED ORDER — POTASSIUM CHLORIDE ER 10 MEQ PO TBCR: 20.0000 meq | EXTENDED_RELEASE_TABLET | Freq: Two times a day (BID) | ORAL | Status: DC

## 2012-03-10 NOTE — Patient Instructions (Addendum)
Continue the below medications,    - Restart metoprolol 25 mg two times daily.     - Increase lasix to 80 mg (2 tabs) in the morning and 40 mg (1 tab) in the evening.     - Start taking potassium 20 mEq (2 tabs) twice daily.      Try to start weighing daily.    Labs next week on 3/27  Follow up in 1 month

## 2012-03-11 ENCOUNTER — Ambulatory Visit: Payer: Medicare Other | Admitting: Rehabilitation

## 2012-03-11 ENCOUNTER — Telehealth (HOSPITAL_COMMUNITY): Payer: Self-pay | Admitting: *Deleted

## 2012-03-11 NOTE — Telephone Encounter (Signed)
Spoke w/pharmacist at [facility] and verified med dose

## 2012-03-11 NOTE — Telephone Encounter (Signed)
Arline Asp called from [facility] regarding a medication for Ms Bueso.  They have a drug therapy question regarding the potassium ER tabs.  The reference # is [id number].  Nicki had placed this order.  Thanks.

## 2012-03-16 ENCOUNTER — Ambulatory Visit: Payer: Medicare Other | Admitting: Physical Therapy

## 2012-03-17 ENCOUNTER — Other Ambulatory Visit (INDEPENDENT_AMBULATORY_CARE_PROVIDER_SITE_OTHER): Payer: Medicare Other

## 2012-03-17 DIAGNOSIS — I5032 Chronic diastolic (congestive) heart failure: Secondary | ICD-10-CM

## 2012-03-17 DIAGNOSIS — I509 Heart failure, unspecified: Secondary | ICD-10-CM

## 2012-03-17 LAB — BASIC METABOLIC PANEL
BUN: 60 mg/dL — ABNORMAL HIGH (ref 6–23)
Creatinine, Ser: 2.4 mg/dL — ABNORMAL HIGH (ref 0.4–1.2)
GFR: 24.95 mL/min — ABNORMAL LOW (ref >60.00)
Glucose, Bld: 139 mg/dL — ABNORMAL HIGH (ref 70–99)

## 2012-03-17 NOTE — Assessment & Plan Note (Signed)
Rate controlled. Will restart metoprolol. Coumadin followed by PCP.

## 2012-03-17 NOTE — Assessment & Plan Note (Addendum)
She appears volume overloaded on exam. Will increase lasix to 80/40. Long discussion reinforcing need for daily weights and reviewed use of sliding scale diuretics. Will also have her restart metoprolol. Check labs 1 week.

## 2012-03-17 NOTE — Progress Notes (Signed)
HPI:  Desiree Pierce is a 78yo woman with a hx of A Fib, HTN, CRI (1.5-2.4) with congenital solitary kidney and morbid obesity. Recently moved to [place] in January. We have been following for diastolic HF and AF.   Echo 10/29/11: EF 55-60% Mild-Mod MR. Mod-severe TR PAP 67.   Saw Dr. Delton Coombes who felt like this was likely secondary pulmonary HTN due to weight and possible COPD. Offered her sleep study but she refused.   She was started on spironolactone. Swelling got much better but potassium went up to almost 7 and it was stopped.   Coumadin followed by Dr Bray/Dr Ronne Binning.  3/15: Na 144, K 3.5, BUN 48, Cr 1.9  She returns for follow up today.  Her Cr had increased from 1.65 so Karie Mainland had called and asked her to stop taking metolazone, the daughter was confused and she has actually been off her metoprolol for a week.  Has not been taking metolazone.  She is not weighing herself daily.  Chronic 2 pillow orthopnea.  Dyspnea at baseline.  No PND.  No palpitations/CP.  Uses walker at home.  + arthritis pain     ROS: All systems negative except as listed in HPI, PMH and Problem List.  Past Medical History  Diagnosis Date  . Morbid obesity   . Atrial fibrillation   . Arthritis   . Allergy history unknown   . Hypertension     Poorly controlled  . CHF (congestive heart failure)   . Pulmonary hypertension   . GERD (gastroesophageal reflux disease)   . COPD (chronic obstructive pulmonary disease)   . OA (osteoarthritis)     Current Outpatient Prescriptions  Medication Sig Dispense Refill  . Casanthranol-Docusate Sodium 30-100 MG CAPS Take 1 capsule by mouth daily.        . cloNIDine (CATAPRES) 0.3 MG tablet Take 0.3 mg by mouth daily.       Marland Kitchen diltiazem (TIAZAC) 360 MG 24 hr capsule Take 360 mg by mouth daily.        . furosemide (LASIX) 40 MG tablet Take 2 tabs (80 mg) in the am and 1 tab (40 mg) in the pm  90 tablet  6  . Multiple Vitamin (MULTIVITAMIN) tablet Take 1 tablet by mouth daily.        .  potassium chloride (K-DUR) 10 MEQ tablet Take 2 tablets (20 mEq total) by mouth 2 (two) times daily.  120 tablet  6  . warfarin (COUMADIN) 5 MG tablet Take 5 mg by mouth as directed.       . metoprolol tartrate (LOPRESSOR) 25 MG tablet Take 25 mg by mouth 2 (two) times daily.       Marland Kitchen DISCONTD: metolazone (ZAROXOLYN) 2.5 MG tablet Take once a week. Or as directed by physician  10 tablet  6     PHYSICAL EXAM: Filed Vitals:   03/10/12 1121  BP: 146/84  Pulse: 95  Weight: 200 lb 8 oz (90.946 kg)  SpO2: 94%   General:  Obese woman sitting on walker. No acute distress. HEENT: normal Neck: supple. thick. JVP 8-9.  Carotids 2+ bilat; no bruits. No lymphadenopathy or thryomegaly appreciated. Cor: PMI nonopalpable. Irregular tachycardic No rubs, gallops, murmur. Lungs: clear Abdomen: obese soft, nontender, nondistended.  Good bowel sounds. Extremities: no cyanosis, clubbing, 1+ edema LLE, 2+ RLE Neuro: alert & orientedx3, cranial nerves grossly intact. moves all 4 extremities w/o difficulty. affect pleasant   ASSESSMENT & PLAN:

## 2012-03-18 ENCOUNTER — Ambulatory Visit: Payer: Medicare Other | Admitting: Physical Therapy

## 2012-03-18 ENCOUNTER — Telehealth (HOSPITAL_COMMUNITY): Payer: Self-pay | Admitting: Physician Assistant

## 2012-03-18 MED ORDER — FUROSEMIDE 40 MG PO TABS: 40.0000 mg | ORAL_TABLET | Freq: Every day | ORAL | Status: DC

## 2012-03-18 NOTE — Telephone Encounter (Signed)
Cr up, Dr. Gala Romney would like to decrease lasix 40 mg BID.  Discussed this with patient's daughter.  Will recheck BMET on April 9th.

## 2012-03-18 NOTE — Telephone Encounter (Signed)
Message copied by Hadassah Pais on Thu Mar 18, 2012  5:02 PM ------      Message from: [name] R      Created: Wed Mar 17, 2012 10:52 PM       Cr up. Decrease lasix back to 40 bid. Recheck 2 weeks.

## 2012-03-22 NOTE — Telephone Encounter (Signed)
Addended by: Noralee Space on: 03/22/2012 08:41 AM   Modules accepted: Orders

## 2012-03-24 ENCOUNTER — Ambulatory Visit: Payer: Medicare Other | Admitting: Physical Therapy

## 2012-03-30 ENCOUNTER — Other Ambulatory Visit (INDEPENDENT_AMBULATORY_CARE_PROVIDER_SITE_OTHER): Payer: Medicare Other

## 2012-03-30 ENCOUNTER — Ambulatory Visit: Payer: Medicare Other | Attending: Internal Medicine | Admitting: Physical Therapy

## 2012-03-30 ENCOUNTER — Ambulatory Visit: Payer: Medicare Other | Admitting: Physical Therapy

## 2012-03-30 DIAGNOSIS — I5020 Unspecified systolic (congestive) heart failure: Secondary | ICD-10-CM

## 2012-03-30 DIAGNOSIS — R262 Difficulty in walking, not elsewhere classified: Secondary | ICD-10-CM | POA: Insufficient documentation

## 2012-03-30 DIAGNOSIS — M25569 Pain in unspecified knee: Secondary | ICD-10-CM | POA: Insufficient documentation

## 2012-03-30 DIAGNOSIS — IMO0001 Reserved for inherently not codable concepts without codable children: Secondary | ICD-10-CM | POA: Insufficient documentation

## 2012-03-30 DIAGNOSIS — M6281 Muscle weakness (generalized): Secondary | ICD-10-CM | POA: Insufficient documentation

## 2012-03-30 DIAGNOSIS — R5381 Other malaise: Secondary | ICD-10-CM | POA: Insufficient documentation

## 2012-03-31 LAB — BASIC METABOLIC PANEL
CO2: 29 mEq/L (ref 19–32)
Calcium: 9.2 mg/dL (ref 8.4–10.5)
Creatinine, Ser: 1.9 mg/dL — ABNORMAL HIGH (ref 0.4–1.2)
GFR: 32.82 mL/min — ABNORMAL LOW (ref >60.00)
Sodium: 144 mEq/L (ref 135–145)

## 2012-04-01 ENCOUNTER — Ambulatory Visit: Payer: Medicare Other | Admitting: Physical Therapy

## 2012-04-06 ENCOUNTER — Ambulatory Visit: Payer: Medicare Other | Admitting: Physical Therapy

## 2012-04-08 ENCOUNTER — Ambulatory Visit: Payer: Medicare Other | Admitting: Rehabilitation

## 2012-04-12 ENCOUNTER — Ambulatory Visit (HOSPITAL_COMMUNITY)
Admission: RE | Admit: 2012-04-12 | Discharge: 2012-04-12 | Disposition: A | Discharge status: Home / Self Care | Payer: Medicare Other | Source: Ambulatory Visit | Attending: Internal Medicine | Admitting: Internal Medicine

## 2012-04-12 ENCOUNTER — Encounter (HOSPITAL_COMMUNITY): Payer: Self-pay

## 2012-04-12 VITALS — BP 128/78 | HR 78 | Wt 205.1 lb

## 2012-04-12 DIAGNOSIS — I509 Heart failure, unspecified: Secondary | ICD-10-CM

## 2012-04-12 DIAGNOSIS — I5032 Chronic diastolic (congestive) heart failure: Secondary | ICD-10-CM | POA: Insufficient documentation

## 2012-04-12 MED ORDER — FUROSEMIDE 20 MG PO TABS: 20.0000 mg | ORAL_TABLET | Freq: Two times a day (BID) | ORAL | Status: DC

## 2012-04-12 NOTE — Assessment & Plan Note (Addendum)
Volume status mildly elevated. Instructed to take Lasix 40 mg BID. Reinforced medication compliance, daily weights, and low salt diet. Follow up in 8 weeks.

## 2012-04-12 NOTE — Progress Notes (Signed)
Patient ID: Desiree Pierce, female   DOB: 1931-05-04, 78 y.o.   MRN: [id number] HPI:  Desiree Pierce is a 78 yo woman with a hx of A Fib, HTN, CRI (1.5-2.4) with congenital solitary kidney and morbid obesity. Recently moved to [place] in January. We have been following for diastolic HF and AF.   Echo 10/29/11: EF 55-60% Mild-Mod MR. Mod-severe TR PAP 67.   Saw Dr. Delton Coombes who felt like this was likely secondary pulmonary HTN due to weight and possible COPD. Offered her sleep study but she refused.   She was started on spironolactone. Swelling got much better but potassium went up to almost 7 and it was stopped.   Coumadin followed by Dr Bray/Dr Ronne Binning.  3/15: Na 144, K 3.5, BUN 48, Cr 1.9 03/30/12 K 4.4 Cr 1.9  She returns for follow up today. Denies SOB/CP/PND/Orthopnea. Weight at home 205. She has not taken any extra lasix. Instructed to take lasix 40 mg bid however she is taking 40 mg in am and 20 mg in pm. Sleeps on 2 pillows.  Chronic lower extremity edema.  She does not follow low salt diet. Compliant with medications.   ROS: All systems negative except as listed in HPI, PMH and Problem List.  Past Medical History  Diagnosis Date  . Morbid obesity   . Atrial fibrillation   . Arthritis   . Allergy history unknown   . Hypertension     Poorly controlled  . CHF (congestive heart failure)   . Pulmonary hypertension   . GERD (gastroesophageal reflux disease)   . COPD (chronic obstructive pulmonary disease)   . OA (osteoarthritis)     Current Outpatient Prescriptions  Medication Sig Dispense Refill  . Casanthranol-Docusate Sodium 30-100 MG CAPS Take 1 capsule by mouth daily.        . cloNIDine (CATAPRES) 0.3 MG tablet Take 0.3 mg by mouth daily.       Marland Kitchen diltiazem (TIAZAC) 360 MG 24 hr capsule Take 360 mg by mouth daily.        . furosemide (LASIX) 40 MG tablet Take 40 mg by mouth daily. Take 40 mg in am and 20 mg in pm      . metoprolol tartrate (LOPRESSOR) 25 MG tablet Take 25 mg by mouth 2  (two) times daily.       . Multiple Vitamin (MULTIVITAMIN) tablet Take 1 tablet by mouth daily.        . potassium chloride (K-DUR) 10 MEQ tablet Take 2 tablets (20 mEq total) by mouth 2 (two) times daily.  120 tablet  6  . warfarin (COUMADIN) 5 MG tablet Take 5 mg by mouth as directed.       Marland Kitchen DISCONTD: furosemide (LASIX) 40 MG tablet Take 1 tablet (40 mg total) by mouth daily.  90 tablet  6  . DISCONTD: metolazone (ZAROXOLYN) 2.5 MG tablet Take once a week. Or as directed by physician  10 tablet  6     PHYSICAL EXAM: Filed Vitals:   04/12/12 1324  BP: 128/78  Pulse: 78  Weight: 205 lb 1.9 oz (93.042 kg)  SpO2: 99%   General:  Obese woman sitting on walker. No acute distress. HEENT: normal Neck: supple. thick. JVP 8-9.  Carotids 2+ bilat; no bruits. No lymphadenopathy or thryomegaly appreciated. Cor: PMI nonopalpable. Irregular tachycardic No rubs, gallops, murmur. Lungs: clear Abdomen: obese soft, nontender, nondistended.  Good bowel sounds. Extremities: no cyanosis, clubbing, 1+ edema LLE, 2+ RLE Neuro: alert & orientedx3, cranial  nerves grossly intact. moves all 4 extremities w/o difficulty. affect pleasant   ASSESSMENT & PLAN:

## 2012-04-12 NOTE — Patient Instructions (Signed)
Take Lasix 40 mg twice a day  Do the following things EVERYDAY: 1) Weigh yourself in the morning before breakfast. Write it down and keep it in a log. 2) Take your medicines as prescribed 3) Eat low salt foods--Limit salt (sodium) to 2000mg  per day.  4) Stay as active as you can everyday  Follow up in 2 months

## 2012-04-13 ENCOUNTER — Ambulatory Visit: Payer: Medicare Other | Admitting: Physical Therapy

## 2012-04-14 ENCOUNTER — Telehealth (HOSPITAL_COMMUNITY): Payer: Self-pay | Admitting: *Deleted

## 2012-04-14 MED ORDER — FUROSEMIDE 40 MG PO TABS: 40.0000 mg | ORAL_TABLET | Freq: Two times a day (BID) | ORAL | Status: DC

## 2012-04-14 NOTE — Telephone Encounter (Signed)
Pharmacy sent a fax because rx for lasix was sent in for 20 mg bid and pt had been on 40 mg 2 tabs in AM and 1 tab in PM, per Tonye Becket, NP she increased pt to 40 mg bid on 4/22 rx was sent in wrong, new rx sent to pharmacy

## 2012-04-15 ENCOUNTER — Ambulatory Visit: Payer: Medicare Other | Admitting: Physical Therapy

## 2012-04-20 ENCOUNTER — Ambulatory Visit: Payer: Medicare Other | Admitting: Physical Therapy

## 2012-04-22 ENCOUNTER — Ambulatory Visit: Payer: Medicare Other | Attending: Internal Medicine | Admitting: Physical Therapy

## 2012-04-22 DIAGNOSIS — M25569 Pain in unspecified knee: Secondary | ICD-10-CM | POA: Insufficient documentation

## 2012-04-22 DIAGNOSIS — M6281 Muscle weakness (generalized): Secondary | ICD-10-CM | POA: Insufficient documentation

## 2012-04-22 DIAGNOSIS — IMO0001 Reserved for inherently not codable concepts without codable children: Secondary | ICD-10-CM | POA: Insufficient documentation

## 2012-04-22 DIAGNOSIS — R5381 Other malaise: Secondary | ICD-10-CM | POA: Insufficient documentation

## 2012-04-22 DIAGNOSIS — R262 Difficulty in walking, not elsewhere classified: Secondary | ICD-10-CM | POA: Insufficient documentation

## 2012-04-27 ENCOUNTER — Ambulatory Visit: Payer: Medicare Other | Admitting: Physical Therapy

## 2012-04-29 ENCOUNTER — Ambulatory Visit: Payer: Medicare Other | Admitting: Physical Therapy

## 2012-05-04 ENCOUNTER — Ambulatory Visit: Payer: Medicare Other | Admitting: Physical Therapy

## 2012-05-06 ENCOUNTER — Ambulatory Visit: Payer: Medicare Other | Admitting: Physical Therapy

## 2012-05-11 ENCOUNTER — Ambulatory Visit: Payer: Medicare Other | Admitting: Physical Therapy

## 2012-05-13 ENCOUNTER — Ambulatory Visit: Payer: Medicare Other | Admitting: Physical Therapy

## 2012-06-17 ENCOUNTER — Ambulatory Visit (HOSPITAL_COMMUNITY)
Admission: RE | Admit: 2012-06-17 | Discharge: 2012-06-17 | Disposition: A | Discharge status: Home / Self Care | Payer: Medicare Other | Source: Ambulatory Visit | Attending: Internal Medicine | Admitting: Internal Medicine

## 2012-06-17 ENCOUNTER — Encounter (HOSPITAL_COMMUNITY): Payer: Self-pay

## 2012-06-17 VITALS — BP 140/82 | HR 76 | Resp 18 | Ht 62.0 in | Wt 197.1 lb

## 2012-06-17 DIAGNOSIS — J4489 Other specified chronic obstructive pulmonary disease: Secondary | ICD-10-CM | POA: Insufficient documentation

## 2012-06-17 DIAGNOSIS — I509 Heart failure, unspecified: Secondary | ICD-10-CM | POA: Insufficient documentation

## 2012-06-17 DIAGNOSIS — I2789 Other specified pulmonary heart diseases: Secondary | ICD-10-CM | POA: Insufficient documentation

## 2012-06-17 DIAGNOSIS — K219 Gastro-esophageal reflux disease without esophagitis: Secondary | ICD-10-CM | POA: Insufficient documentation

## 2012-06-17 DIAGNOSIS — I059 Rheumatic mitral valve disease, unspecified: Secondary | ICD-10-CM | POA: Insufficient documentation

## 2012-06-17 DIAGNOSIS — J449 Chronic obstructive pulmonary disease, unspecified: Secondary | ICD-10-CM | POA: Insufficient documentation

## 2012-06-17 DIAGNOSIS — Q602 Renal agenesis, unspecified: Secondary | ICD-10-CM | POA: Insufficient documentation

## 2012-06-17 DIAGNOSIS — G4733 Obstructive sleep apnea (adult) (pediatric): Secondary | ICD-10-CM | POA: Insufficient documentation

## 2012-06-17 DIAGNOSIS — N189 Chronic kidney disease, unspecified: Secondary | ICD-10-CM | POA: Insufficient documentation

## 2012-06-17 DIAGNOSIS — I4891 Unspecified atrial fibrillation: Secondary | ICD-10-CM | POA: Insufficient documentation

## 2012-06-17 DIAGNOSIS — I5032 Chronic diastolic (congestive) heart failure: Secondary | ICD-10-CM

## 2012-06-17 DIAGNOSIS — Z7901 Long term (current) use of anticoagulants: Secondary | ICD-10-CM | POA: Insufficient documentation

## 2012-06-17 DIAGNOSIS — I129 Hypertensive chronic kidney disease with stage 1 through stage 4 chronic kidney disease, or unspecified chronic kidney disease: Secondary | ICD-10-CM | POA: Insufficient documentation

## 2012-06-17 NOTE — Assessment & Plan Note (Signed)
Volume status stable. Reinforced limiting fluid intake to < 2 liters per day. Continue current diuretic regimen. Follow up in 2 months.

## 2012-06-17 NOTE — Progress Notes (Signed)
Patient ID: Desiree Pierce, female   DOB: 12/27/1930, 78 y.o.   MRN: [id number] HPI:  [name] is a 78 yo woman with a hx of A Fib, HTN, CRI (1.5-2.4) with congenital solitary kidney and morbid obesity. Recently moved to [place] in January. We have been following for diastolic HF and AF.   Echo 10/29/11: EF 55-60% Mild-Mod MR. Mod-severe TR PAP 67.   Saw Dr. Delton Coombes who felt like this was likely secondary pulmonary HTN due to weight and possible COPD. Offered her sleep study but she refused.   She was started on spironolactone. Swelling got much better but potassium went up to almost 7 and it was stopped.   Coumadin followed by Dr Bray/Dr Ronne Binning.  3/15: Na 144, K 3.5, BUN 48, Cr 1.9 03/30/12 K 4.4 Cr 1.9  She returns for follow up today. Denies SOB/CP/PND/Orthopnea. Weight at home 195 pounds. Weighs occasionally. She has not required any extra Lasix. Tries to follow low salt diet. Drinking 2 liters of fluid per day. [facility] following. Followed by PT at home.    ROS: All systems negative except as listed in HPI, PMH and Problem List.  Past Medical History  Diagnosis Date  . Morbid obesity   . Atrial fibrillation   . Arthritis   . Allergy history unknown   . Hypertension     Poorly controlled  . CHF (congestive heart failure)   . Pulmonary hypertension   . GERD (gastroesophageal reflux disease)   . COPD (chronic obstructive pulmonary disease)   . OA (osteoarthritis)     Current Outpatient Prescriptions  Medication Sig Dispense Refill  . Casanthranol-Docusate Sodium 30-100 MG CAPS Take 1 capsule by mouth daily.        . cloNIDine (CATAPRES) 0.3 MG tablet Take 0.3 mg by mouth daily.       Marland Kitchen diltiazem (TIAZAC) 360 MG 24 hr capsule Take 360 mg by mouth daily.        . furosemide (LASIX) 40 MG tablet Take 1 tablet (40 mg total) by mouth 2 (two) times daily.  60 tablet  6  . metoprolol tartrate (LOPRESSOR) 25 MG tablet Take 25 mg by mouth 2 (two) times daily.       . Multiple Vitamin  (MULTIVITAMIN) tablet Take 1 tablet by mouth daily.        . potassium chloride (K-DUR) 10 MEQ tablet Take 2 tablets (20 mEq total) by mouth 2 (two) times daily.  120 tablet  6  . warfarin (COUMADIN) 5 MG tablet Take 5 mg by mouth as directed.       Marland Kitchen DISCONTD: metolazone (ZAROXOLYN) 2.5 MG tablet Take once a week. Or as directed by physician  10 tablet  6     PHYSICAL EXAM: Filed Vitals:   06/17/12 1353  BP: 140/82  Pulse: 76  Resp: 18  Height: 5\' 2"  (1.575 m)  Weight: 197 lb 1.9 oz (89.413 kg)  SpO2: 95%   General:  Obese woman sitting in wheelchair. No acute distress. HEENT: normal Neck: supple. thick. JVP 6-7.  Carotids 2+ bilat; no bruits. No lymphadenopathy or thryomegaly appreciated. Cor: PMI nonopalpable. Irregular No rubs, gallops, murmur. Lungs: clear Abdomen: obese soft, nontender, nondistended.  Good bowel sounds. Extremities: no cyanosis, clubbing, edema Neuro: alert & orientedx3, cranial nerves grossly intact. moves all 4 extremities w/o difficulty. affect pleasant   ASSESSMENT & PLAN:

## 2012-06-17 NOTE — Patient Instructions (Addendum)
Follow up in 2 months  Do the following things EVERYDAY: 1) Weigh yourself in the morning before breakfast. Write it down and keep it in a log. 2) Take your medicines as prescribed 3) Eat low salt foods-Limit salt (sodium) to 2000 mg per day.  4) Stay as active as you can everyday 5) Limit all fluids for the day to less than 2 liters 

## 2012-08-03 ENCOUNTER — Ambulatory Visit: Payer: Medicare Other | Admitting: Physical Therapy

## 2012-11-19 ENCOUNTER — Emergency Department (HOSPITAL_COMMUNITY): Payer: Medicare Other

## 2012-11-19 ENCOUNTER — Inpatient Hospital Stay (HOSPITAL_COMMUNITY)
Admission: EM | Admit: 2012-11-19 | Discharge: 2012-12-19 | DRG: 264 | Disposition: A | Discharge status: Other Acute Inpatient Hospital | Payer: Medicare Other | Attending: Internal Medicine | Admitting: Internal Medicine

## 2012-11-19 ENCOUNTER — Encounter (HOSPITAL_COMMUNITY): Payer: Self-pay | Admitting: *Deleted

## 2012-11-19 ENCOUNTER — Inpatient Hospital Stay (HOSPITAL_COMMUNITY): Payer: Medicare Other

## 2012-11-19 DIAGNOSIS — N2581 Secondary hyperparathyroidism of renal origin: Secondary | ICD-10-CM | POA: Diagnosis not present

## 2012-11-19 DIAGNOSIS — I5033 Acute on chronic diastolic (congestive) heart failure: Principal | ICD-10-CM

## 2012-11-19 DIAGNOSIS — I509 Heart failure, unspecified: Secondary | ICD-10-CM

## 2012-11-19 DIAGNOSIS — R5381 Other malaise: Secondary | ICD-10-CM | POA: Diagnosis present

## 2012-11-19 DIAGNOSIS — R791 Abnormal coagulation profile: Secondary | ICD-10-CM | POA: Diagnosis present

## 2012-11-19 DIAGNOSIS — D638 Anemia in other chronic diseases classified elsewhere: Secondary | ICD-10-CM | POA: Diagnosis present

## 2012-11-19 DIAGNOSIS — N186 End stage renal disease: Secondary | ICD-10-CM | POA: Diagnosis not present

## 2012-11-19 DIAGNOSIS — D6832 Hemorrhagic disorder due to extrinsic circulating anticoagulants: Secondary | ICD-10-CM

## 2012-11-19 DIAGNOSIS — M1710 Unilateral primary osteoarthritis, unspecified knee: Secondary | ICD-10-CM | POA: Diagnosis present

## 2012-11-19 DIAGNOSIS — F29 Unspecified psychosis not due to a substance or known physiological condition: Secondary | ICD-10-CM | POA: Diagnosis not present

## 2012-11-19 DIAGNOSIS — J449 Chronic obstructive pulmonary disease, unspecified: Secondary | ICD-10-CM | POA: Diagnosis present

## 2012-11-19 DIAGNOSIS — R609 Edema, unspecified: Secondary | ICD-10-CM

## 2012-11-19 DIAGNOSIS — Q602 Renal agenesis, unspecified: Secondary | ICD-10-CM

## 2012-11-19 DIAGNOSIS — Z7901 Long term (current) use of anticoagulants: Secondary | ICD-10-CM

## 2012-11-19 DIAGNOSIS — J4489 Other specified chronic obstructive pulmonary disease: Secondary | ICD-10-CM

## 2012-11-19 DIAGNOSIS — D649 Anemia, unspecified: Secondary | ICD-10-CM

## 2012-11-19 DIAGNOSIS — D696 Thrombocytopenia, unspecified: Secondary | ICD-10-CM | POA: Diagnosis not present

## 2012-11-19 DIAGNOSIS — I120 Hypertensive chronic kidney disease with stage 5 chronic kidney disease or end stage renal disease: Secondary | ICD-10-CM | POA: Diagnosis not present

## 2012-11-19 DIAGNOSIS — Z79899 Other long term (current) drug therapy: Secondary | ICD-10-CM

## 2012-11-19 DIAGNOSIS — N184 Chronic kidney disease, stage 4 (severe): Secondary | ICD-10-CM

## 2012-11-19 DIAGNOSIS — N390 Urinary tract infection, site not specified: Secondary | ICD-10-CM | POA: Diagnosis not present

## 2012-11-19 DIAGNOSIS — Z993 Dependence on wheelchair: Secondary | ICD-10-CM

## 2012-11-19 DIAGNOSIS — I2789 Other specified pulmonary heart diseases: Secondary | ICD-10-CM

## 2012-11-19 DIAGNOSIS — I5032 Chronic diastolic (congestive) heart failure: Secondary | ICD-10-CM

## 2012-11-19 DIAGNOSIS — K219 Gastro-esophageal reflux disease without esophagitis: Secondary | ICD-10-CM | POA: Diagnosis present

## 2012-11-19 DIAGNOSIS — I4891 Unspecified atrial fibrillation: Secondary | ICD-10-CM

## 2012-11-19 DIAGNOSIS — T45515A Adverse effect of anticoagulants, initial encounter: Secondary | ICD-10-CM

## 2012-11-19 DIAGNOSIS — N179 Acute kidney failure, unspecified: Secondary | ICD-10-CM

## 2012-11-19 DIAGNOSIS — Y9210 Unspecified residential institution as the place of occurrence of the external cause: Secondary | ICD-10-CM | POA: Diagnosis not present

## 2012-11-19 DIAGNOSIS — Z96659 Presence of unspecified artificial knee joint: Secondary | ICD-10-CM

## 2012-11-19 DIAGNOSIS — Z87891 Personal history of nicotine dependence: Secondary | ICD-10-CM

## 2012-11-19 DIAGNOSIS — Y841 Kidney dialysis as the cause of abnormal reaction of the patient, or of later complication, without mention of misadventure at the time of the procedure: Secondary | ICD-10-CM | POA: Diagnosis not present

## 2012-11-19 DIAGNOSIS — G4700 Insomnia, unspecified: Secondary | ICD-10-CM | POA: Diagnosis not present

## 2012-11-19 DIAGNOSIS — T82598A Other mechanical complication of other cardiac and vascular devices and implants, initial encounter: Secondary | ICD-10-CM | POA: Diagnosis not present

## 2012-11-19 DIAGNOSIS — Z91199 Patient's noncompliance with other medical treatment and regimen due to unspecified reason: Secondary | ICD-10-CM

## 2012-11-19 DIAGNOSIS — Z9119 Patient's noncompliance with other medical treatment and regimen: Secondary | ICD-10-CM

## 2012-11-19 DIAGNOSIS — E875 Hyperkalemia: Secondary | ICD-10-CM | POA: Diagnosis not present

## 2012-11-19 DIAGNOSIS — G4733 Obstructive sleep apnea (adult) (pediatric): Secondary | ICD-10-CM | POA: Diagnosis present

## 2012-11-19 DIAGNOSIS — N189 Chronic kidney disease, unspecified: Secondary | ICD-10-CM

## 2012-11-19 DIAGNOSIS — J9600 Acute respiratory failure, unspecified whether with hypoxia or hypercapnia: Secondary | ICD-10-CM

## 2012-11-19 DIAGNOSIS — I10 Essential (primary) hypertension: Secondary | ICD-10-CM

## 2012-11-19 DIAGNOSIS — Q605 Renal hypoplasia, unspecified: Secondary | ICD-10-CM

## 2012-11-19 HISTORY — DX: Renal agenesis, unilateral: Q60.0

## 2012-11-19 LAB — COMPREHENSIVE METABOLIC PANEL
AST: 29 U/L (ref 0–37)
CO2: 29 mEq/L (ref 19–32)
Calcium: 9.2 mg/dL (ref 8.4–10.5)
Chloride: 103 mEq/L (ref 96–112)
Creatinine, Ser: 2.14 mg/dL — ABNORMAL HIGH (ref 0.50–1.10)
GFR calc Af Amer: 24 mL/min — ABNORMAL LOW (ref >90)
GFR calc non Af Amer: 21 mL/min — ABNORMAL LOW (ref >90)
Glucose, Bld: 125 mg/dL — ABNORMAL HIGH (ref 70–99)
Total Bilirubin: 0.6 mg/dL (ref 0.3–1.2)

## 2012-11-19 LAB — TROPONIN I: Troponin I: <0.3 ng/mL (ref <0.30)

## 2012-11-19 LAB — CBC
Hemoglobin: 10.8 g/dL — ABNORMAL LOW (ref 12.0–15.0)
MCH: 25.8 pg — ABNORMAL LOW (ref 26.0–34.0)
MCHC: 28.8 g/dL — ABNORMAL LOW (ref 30.0–36.0)
Platelets: 159 10*3/uL (ref 150–400)
RDW: 17.3 % — ABNORMAL HIGH (ref 11.5–15.5)

## 2012-11-19 LAB — POCT I-STAT TROPONIN I: Troponin i, poc: 0.02 ng/mL (ref 0.00–0.08)

## 2012-11-19 MED ORDER — FUROSEMIDE 10 MG/ML IJ SOLN
80.0000 mg | Freq: Two times a day (BID) | INTRAMUSCULAR | Status: DC
  Administered 2012-11-20 – 2012-11-22 (×5): 80 mg via INTRAVENOUS
  Filled 2012-11-19 (×9): qty 8

## 2012-11-19 MED ORDER — ZOLPIDEM TARTRATE 5 MG PO TABS
5.0000 mg | ORAL_TABLET | Freq: Every evening | ORAL | Status: DC | PRN
  Administered 2012-12-03: 5 mg via ORAL
  Filled 2012-11-19: qty 1

## 2012-11-19 MED ORDER — METOPROLOL TARTRATE 25 MG PO TABS
25.0000 mg | ORAL_TABLET | Freq: Two times a day (BID) | ORAL | Status: DC
  Administered 2012-11-20 – 2012-11-30 (×23): 25 mg via ORAL
  Filled 2012-11-19 (×25): qty 1

## 2012-11-19 MED ORDER — HEPARIN BOLUS VIA INFUSION
3500.0000 [IU] | Freq: Once | INTRAVENOUS | Status: AC
  Administered 2012-11-19: 3500 [IU] via INTRAVENOUS

## 2012-11-19 MED ORDER — DILTIAZEM HCL ER COATED BEADS 360 MG PO CP24
360.0000 mg | ORAL_CAPSULE | Freq: Every day | ORAL | Status: DC
  Administered 2012-11-20 – 2012-12-12 (×23): 360 mg via ORAL
  Filled 2012-11-19 (×25): qty 1

## 2012-11-19 MED ORDER — CLONIDINE HCL 0.2 MG PO TABS
0.3000 mg | ORAL_TABLET | Freq: Every day | ORAL | Status: DC
  Administered 2012-11-19: 0.3 mg via ORAL
  Filled 2012-11-19: qty 1

## 2012-11-19 MED ORDER — DILTIAZEM HCL ER BEADS 240 MG PO CP24: 360.0000 mg | ORAL_CAPSULE | Freq: Every day | ORAL | Status: DC

## 2012-11-19 MED ORDER — NITROGLYCERIN 2 % TD OINT
1.0000 [in_us] | TOPICAL_OINTMENT | Freq: Once | TRANSDERMAL | Status: AC
  Administered 2012-11-19: 1 [in_us] via TOPICAL
  Filled 2012-11-19: qty 1

## 2012-11-19 MED ORDER — WARFARIN - PHARMACIST DOSING INPATIENT
Freq: Every day | Status: DC
  Administered 2012-11-22 – 2012-12-01 (×6)

## 2012-11-19 MED ORDER — ONDANSETRON HCL 4 MG/2ML IJ SOLN: 4.0000 mg | Freq: Four times a day (QID) | INTRAMUSCULAR | Status: DC | PRN

## 2012-11-19 MED ORDER — SODIUM CHLORIDE 0.9 % IV SOLN
250.0000 mL | INTRAVENOUS | Status: DC | PRN
  Administered 2012-11-20 – 2012-11-21 (×2): 250 mL via INTRAVENOUS
  Administered 2012-11-22: 500 mL via INTRAVENOUS
  Administered 2012-11-24: 250 mL via INTRAVENOUS
  Administered 2012-11-27: 10 mL via INTRAVENOUS
  Administered 2012-12-03: 250 mL via INTRAVENOUS

## 2012-11-19 MED ORDER — POTASSIUM CHLORIDE CRYS ER 20 MEQ PO TBCR
40.0000 meq | EXTENDED_RELEASE_TABLET | Freq: Two times a day (BID) | ORAL | Status: DC
  Administered 2012-11-20 – 2012-11-29 (×20): 40 meq via ORAL
  Filled 2012-11-19 (×23): qty 2

## 2012-11-19 MED ORDER — CLONIDINE HCL 0.3 MG PO TABS
0.3000 mg | ORAL_TABLET | Freq: Every day | ORAL | Status: DC
  Administered 2012-11-20 – 2012-11-25 (×6): 0.3 mg via ORAL
  Filled 2012-11-19 (×7): qty 1

## 2012-11-19 MED ORDER — ALPRAZOLAM 0.25 MG PO TABS
0.2500 mg | ORAL_TABLET | Freq: Two times a day (BID) | ORAL | Status: DC | PRN
  Administered 2012-12-02 – 2012-12-10 (×12): 0.25 mg via ORAL
  Filled 2012-11-19 (×14): qty 1

## 2012-11-19 MED ORDER — WARFARIN SODIUM 5 MG PO TABS
5.0000 mg | ORAL_TABLET | Freq: Once | ORAL | Status: AC
  Administered 2012-11-19: 5 mg via ORAL
  Filled 2012-11-19 (×2): qty 1

## 2012-11-19 MED ORDER — FUROSEMIDE 10 MG/ML IJ SOLN
80.0000 mg | Freq: Once | INTRAMUSCULAR | Status: AC
  Administered 2012-11-19: 80 mg via INTRAVENOUS
  Filled 2012-11-19: qty 8

## 2012-11-19 MED ORDER — HEPARIN (PORCINE) IN NACL 100-0.45 UNIT/ML-% IJ SOLN
1000.0000 [IU]/h | INTRAMUSCULAR | Status: DC
  Administered 2012-11-19: 1000 [IU]/h via INTRAVENOUS
  Filled 2012-11-19 (×2): qty 250

## 2012-11-19 MED ORDER — ACETAMINOPHEN 325 MG PO TABS
650.0000 mg | ORAL_TABLET | ORAL | Status: DC | PRN
  Administered 2012-11-22 – 2012-12-10 (×6): 650 mg via ORAL
  Filled 2012-11-19 (×6): qty 2

## 2012-11-19 MED ORDER — SENNOSIDES-DOCUSATE SODIUM 8.6-50 MG PO TABS
1.0000 | ORAL_TABLET | Freq: Every day | ORAL | Status: DC
  Administered 2012-11-20 – 2012-12-19 (×25): 1 via ORAL
  Filled 2012-11-19 (×24): qty 1

## 2012-11-19 NOTE — ED Provider Notes (Addendum)
History     CSN: [id number]  Arrival date & time 11/19/12  1609   First MD Initiated Contact with Patient 11/19/12 1759      Chief Complaint  Patient presents with  . Leg Swelling    (Consider location/radiation/quality/duration/timing/severity/associated sxs/prior treatment) Patient is a 78 y.o. female presenting with shortness of breath. The history is provided by the patient. No language interpreter was used.  Shortness of Breath  The current episode started 5 to 7 days ago (Pt has known congestive heart failure, followed by Dr. Teressa Lower.  She has noted increased swelling in her legs and worsening shortness of breath over about at week.  ). The onset was gradual. The problem occurs continuously. The problem has been gradually worsening. The problem is moderate. Nothing relieves the symptoms. Nothing aggravates the symptoms. Associated symptoms include orthopnea and shortness of breath. Pertinent negatives include no chest pain, no chest pressure and no fever. She was not exposed to toxic fumes. Prior hospitalizations: No recent hospitalizations. She has received no recent medical care.    Past Medical History  Diagnosis Date  . Morbid obesity   . Atrial fibrillation   . Arthritis   . Allergy history unknown   . Hypertension     Poorly controlled  . CHF (congestive heart failure)   . Pulmonary hypertension   . GERD (gastroesophageal reflux disease)   . COPD (chronic obstructive pulmonary disease)   . OA (osteoarthritis)     Past Surgical History  Procedure Date  . Total abdominal hysterectomy   . Total knee arthroplasty     Right knee  . Ankle fracture surgery     Right ankle repair    Family History  Problem Relation Age of Onset  . Allergies Mother   . Heart disease Mother   . Asthma Father   . Diabetes type II Maternal Grandmother     History  Substance Use Topics  . Smoking status: Former Smoker -- 0.1 packs/day for 5 years    Quit date: 12/22/1968  .  Smokeless tobacco: Not on file  . Alcohol Use: No    OB History    Grav Para Term Preterm Abortions TAB SAB Ect Mult Living                  Review of Systems  Constitutional: Negative.  Negative for fever and chills.  HENT: Negative.   Eyes: Negative.   Respiratory: Positive for shortness of breath.   Cardiovascular: Positive for orthopnea and leg swelling. Negative for chest pain.  Gastrointestinal: Negative.   Genitourinary: Negative.   Musculoskeletal: Negative.   Neurological: Negative.   Psychiatric/Behavioral: Negative.     Allergies  Review of patient's allergies indicates no known allergies.  Home Medications   Current Outpatient Rx  Name  Route  Sig  Dispense  Refill  . CASANTHRANOL-DOCUSATE SODIUM 30-100 MG PO CAPS   Oral   Take 1 capsule by mouth daily.           Marland Kitchen CLONIDINE HCL 0.3 MG PO TABS   Oral   Take 0.3 mg by mouth daily.          Marland Kitchen DILTIAZEM HCL ER BEADS 360 MG PO CP24   Oral   Take 360 mg by mouth daily.           . FUROSEMIDE 40 MG PO TABS   Oral   Take 1 tablet (40 mg total) by mouth 2 (two) times daily.   60 tablet  6   . METOPROLOL TARTRATE 25 MG PO TABS   Oral   Take 25 mg by mouth 2 (two) times daily.          Marland Kitchen ONE-DAILY MULTI VITAMINS PO TABS   Oral   Take 1 tablet by mouth daily.           Marland Kitchen POTASSIUM CHLORIDE CRYS ER 20 MEQ PO TBCR   Oral   Take 40 mEq by mouth 2 (two) times daily.         . WARFARIN SODIUM 2.5 MG PO TABS   Oral   Take 2.5 mg by mouth daily. Patient takes 1 tablet (2.5 mg) every day except for Monday, Wednesday and Fridays she takes 1.5 tablets (3.75 mg)           BP 162/99  Pulse 68  Temp 98 F (36.7 C) (Oral)  Resp 17  SpO2 93%  Physical Exam  Nursing note and vitals reviewed. Constitutional: She is oriented to person, place, and time. She appears well-developed and well-nourished.       In mild distress with shortness of breath.  HENT:  Head: Normocephalic and atraumatic.    Right Ear: External ear normal.  Left Ear: External ear normal.  Mouth/Throat: Oropharynx is clear and moist.  Eyes: Conjunctivae normal and EOM are normal. Pupils are equal, round, and reactive to light.  Neck: Neck supple.       Has prominent jugular venous distention while resting at 30 degrees head elevation.  Cardiovascular: Normal rate, regular rhythm and normal heart sounds.   Pulmonary/Chest: Effort normal.       Rales at both bases.  Abdominal: Soft. Bowel sounds are normal. She exhibits distension.  Musculoskeletal: She exhibits edema.       #+ bilateral lower leg edema.  Neurological: She is alert and oriented to person, place, and time.       No sensory or motor deficit.  Skin: Skin is warm and dry.  Psychiatric: She has a normal mood and affect. Her behavior is normal.    ED Course  Procedures (including critical care time)  Labs Reviewed  COMPREHENSIVE METABOLIC PANEL - Abnormal; Notable for the following:    Glucose, Bld 125 (*)     BUN 41 (*)     Creatinine, Ser 2.14 (*)     Albumin 3.1 (*)     GFR calc non Af Amer 21 (*)     GFR calc Af Amer 24 (*)     All other components within normal limits  PRO B NATRIURETIC PEPTIDE - Abnormal; Notable for the following:    Pro B Natriuretic peptide (BNP) 7488.0 (*)     All other components within normal limits  POCT I-STAT TROPONIN I   Dg Chest 2 View  11/19/2012  *RADIOLOGY REPORT*  Clinical Data: Bilateral leg swelling.  History of congestive heart failure.  CHEST - 2 VIEW  Comparison: None.  Findings: There is cardiomegaly.  Pulmonary vascularity is normal. There is a small right pleural effusion.  Chronic accentuation of the thoracic kyphosis.  IMPRESSION: Cardiomegaly with a small right pleural effusion.  Pulmonary vascularity is normal.   Original Report Authenticated By: Francene Boyers, M.D.    6:54 PM Case discussed with Dr. Teressa Lower --> had advised that in addition to medications he wanted pt to have a foley  catheter.  1. Congestive heart disease           Carleene Cooper III, MD 11/19/12 1900  Carleene Cooper III, MD 11/19/12 1901

## 2012-11-19 NOTE — Procedures (Signed)
Central Venous Catheter Insertion Procedure Note Tvisha Schwoerer [id number] 07-12-1931  Procedure: Insertion of Central Venous Catheter Indications: Assessment of intravascular volume and Drug and/or fluid administration  Procedure Details Consent: Risks of procedure as well as the alternatives and risks of each were explained to the (patient/caregiver).  Consent for procedure obtained. Time Out: Verified patient identification, verified procedure, site/side was marked, verified correct patient position, special equipment/implants available, medications/allergies/relevent history reviewed, required imaging and test results available.  Performed  Maximum sterile technique was used including antiseptics, cap, gloves, gown, hand hygiene, mask and sheet. Skin prep: Chlorhexidine; local anesthetic administered A antimicrobial bonded/coated triple lumen catheter was placed in the right subclavian vein using the Seldinger technique.  Evaluation Blood flow good Complications: No apparent complications Patient did tolerate procedure well. Chest X-ray ordered to verify placement.  CXR: pending.  Daniel Bensimhon 11/19/2012, 8:18 PM

## 2012-11-19 NOTE — ED Notes (Signed)
Cardiologist in with pt 

## 2012-11-19 NOTE — Progress Notes (Addendum)
ANTICOAGULATION CONSULT NOTE - Initial Consult  Pharmacy Consult for Heparin and Coumadin Indication: atrial fibrillation  No Known Allergies  Patient Measurements: Height: 5\' 1"  (154.9 cm) Weight: 197 lb 1.5 oz (89.4 kg) (most recent weight from 06/17/12) IBW/kg (Calculated) : 47.8  Heparin Dosing Weight: 68.6 kg  Vital Signs: Temp: 98 F (36.7 C) (11/29 1620) Temp src: Oral (11/29 1620) BP: 162/99 mmHg (11/29 1822) Pulse Rate: 68  (11/29 1822)  Labs:  Basename 11/19/12 1937 11/19/12 1655  HGB -- --  HCT -- --  PLT -- --  APTT -- --  LABPROT 12.2 --  INR 0.91 --  HEPARINUNFRC -- --  CREATININE -- 2.14*  CKTOTAL -- --  CKMB -- --  TROPONINI -- --    Estimated Creatinine Clearance: 21.7 ml/min (by C-G formula based on Cr of 2.14).   Medical History: Past Medical History  Diagnosis Date  . Morbid obesity   . Atrial fibrillation   . Arthritis   . Allergy history unknown   . Hypertension     Poorly controlled  . CHF (congestive heart failure)   . Pulmonary hypertension   . GERD (gastroesophageal reflux disease)   . COPD (chronic obstructive pulmonary disease)   . OA (osteoarthritis)       Assessment: 78 y.o female with PMHx s/f chronic diastolic CHF, congenital solitary kidney, CKD (stage IV, baseline Cr 1.5-2.4), COPD + OSA + morbid obesity ->secondary pulmonary HTN, permanent atrial fibrillation, OSA, GERD who presents to [facility] ED with shortness of breath.  PTA she is on coumadin for PAF (dose reported to be 2.5 mg qMWF and 3.75 mg qTTSS-last taken on 11/18/12).  INR =0.91 on admit tonight, is subtherapeutic.  Patient admits to occasionally missing some coumadin doses.  No bleeding reported. SCr = 2.14  Goal of Therapy:  Heparin level 0.3-0.7 units/ml Monitor platelets by anticoagulation protocol: Yes INR = 2-3   Plan:  Heparin bolus 3500 units IV x1 Start IV heparin drip at 1000 units/hr.  Coumadin 5 mg po tonight x1 Check 8 hr heparin level.  Daily  heparin level, INR and CBC.   Noah Delaine, RPh Clinical Pharmacist Pager: [phone] 11/19/2012,8:09 PM

## 2012-11-19 NOTE — H&P (Addendum)
History and Physical   Patient ID: Desiree Pierce MRN: [id number], DOB/AGE: 1931/08/01   Admit date: 11/19/2012 Date of Consult: 11/19/2012   Primary Physician: No primary provider on file. Primary Cardiologist: Nicholes Mango, MD  HPI: Desiree Pierce is a 78yo female with PMHx s/f chronic diastolic CHF, congenital solitary kidney, CKD (stage IV, baseline Cr 1.5-2.4), COPD + OSA + morbid obesity ->secondary pulmonary HTN, permanent atrial fibrillation, OSA, GERD who presents to [facility] ED with shortness of breath.   She last followed up in CHF clinic in 05/2012. Baseline weight 195 lbs. Denied weighing daily. Denied needed increased Lasix. Drinking 2L fluid/day. Fluid restriction enforced. Two month follow-up recommended, but this is not documented.   10/2011 echo: LVEF 55-60%, moderate LVH, mod-severe biatrial enlargement, mild MR, mod-severe TR, normal RV size and function.   She reports experiencing progressive SOB and worsening LE edema. She has noticed a subjective weight increase. Denies chest pain, palpitations, PND, orthopnea. She does endorse new cough productive of white sputum. She is quite limited by arthritis, and is essentially wheelchair bound. She lives alone and no longer received home health services. She has not followed up in the CHF clinic since June. She admits to occasional dietary indiscretion, including Thanksgiving yesterday. No recent illness, fevers or chills. No active bleeding. Reports compliance with Lasix and Coumadin. Daughter is quite concerned about living situation.   In the ED, EKG reveals nonspecific inferior subtle TWIs. No ST changes. POC TnI 0.02.  pBNP 7488.0 .CXR reveals cardiomegaly, small R pleural effusion, otherwise unremarkable. BMET reveals BUN 41, Cr 2.14.   Problem List: Past Medical History  Diagnosis Date  . Morbid obesity   . Atrial fibrillation   . Arthritis   . Allergy history unknown   . Hypertension     Poorly controlled  . CHF  (congestive heart failure)   . Pulmonary hypertension   . GERD (gastroesophageal reflux disease)   . COPD (chronic obstructive pulmonary disease)   . OA (osteoarthritis)     Past Surgical History  Procedure Date  . Total abdominal hysterectomy   . Total knee arthroplasty     Right knee  . Ankle fracture surgery     Right ankle repair     Allergies: No Known Allergies  Home Medications: Prior to Admission medications   Medication Sig Start Date End Date Taking? Authorizing Provider  Casanthranol-Docusate Sodium 30-100 MG CAPS Take 1 capsule by mouth daily.     Yes Historical Provider, MD  cloNIDine (CATAPRES) 0.3 MG tablet Take 0.3 mg by mouth daily.    Yes Historical Provider, MD  diltiazem (TIAZAC) 360 MG 24 hr capsule Take 360 mg by mouth daily.     Yes Historical Provider, MD  furosemide (LASIX) 40 MG tablet Take 1 tablet (40 mg total) by mouth 2 (two) times daily. 04/14/12  Yes Dolores Patty, MD  metoprolol tartrate (LOPRESSOR) 25 MG tablet Take 25 mg by mouth 2 (two) times daily.    Yes Historical Provider, MD  Multiple Vitamin (MULTIVITAMIN) tablet Take 1 tablet by mouth daily.     Yes Historical Provider, MD  potassium chloride SA (K-DUR,KLOR-CON) 20 MEQ tablet Take 40 mEq by mouth 2 (two) times daily.   Yes Historical Provider, MD  warfarin (COUMADIN) 2.5 MG tablet Take 2.5 mg by mouth daily. Patient takes 1 tablet (2.5 mg) every day except for Monday, Wednesday and Fridays she takes 1.5 tablets (3.75 mg)   Yes Historical Provider, MD    Inpatient  Medications:     . furosemide  80 mg Intravenous Once  . nitroGLYCERIN  1 inch Topical Once    (Not in a hospital admission)  Family History  Problem Relation Age of Onset  . Allergies Mother   . Heart disease Mother   . Asthma Father   . Diabetes type II Maternal Grandmother      History   Social History  . Marital Status: Widowed    Spouse Name: R    Number of Children: N/A  . Years of Education: N/A    Occupational History  . 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 Smoking status: Former Smoker -- 0.1 packs/day for 5 years    Quit date: 12/22/1968  . Smokeless tobacco: Not on file  . Alcohol Use: No  . Drug Use: Not on file  . Sexually Active: Not on file   Other Topics Concern  . Not on file   Social History Narrative   Originally from [place] childrenWidowed     Review of Systems: General: positive for weight increase, negative for chills, fever, night sweats or weight changes.  Cardiovascular: positive for worsening SOB, LE edema, negative for chest pain, dyspnea on exertion, orthopnea, palpitations, paroxysmal nocturnal dyspnea  Dermatological: positive for bilateral LE rash Respiratory: positive for productive cough and wheezing Urologic: negative for hematuria Abdominal: negative for nausea, vomiting, diarrhea, bright red blood per rectum, melena, or hematemesis Neurologic: negative for visual changes, syncope, or dizziness All other systems reviewed and are otherwise negative except as noted above.  Physical Exam: Blood pressure 162/99, pulse 68, temperature 98 F (36.7 C), temperature source Oral, resp. rate 17, SpO2 93.00%.    General: Elderly, obese, in no acute distress. Head: Normocephalic, atraumatic, sclera non-icteric, no xanthomas, nares are without discharge.  Neck: Negative for carotid bruits. JVP difficult to appreciate d/t redundant neck tissue.  Lungs: Centralized rhonchi in anterior lung fields. No appreciable rales. Occasional expiratory wheezes. Breathing is mildly labored. Heart: Irregularly irregular, with S1 S2. No murmurs, rubs, or gallops appreciated. Abdomen: Soft, non-tender, non-distended with normoactive bowel sounds. No hepatomegaly. No rebound/guarding. No obvious abdominal masses. Msk:  Strength and tone appears normal for age. Extremities: 2+ pretibial edema bilaterally, no clubbing or cyanosis.   Distal pedal pulses are 2+ and equal bilaterally. Neuro: Alert and oriented X 3. Moves all extremities spontaneously. Psych:  Responds to questions appropriately with a normal affect.  Labs:  Lab 11/19/12 1655  NA 143  K 4.0  CL 103  CO2 29  BUN 41*  CREATININE 2.14*  CALCIUM 9.2  PROT 7.3  BILITOT 0.6  ALKPHOS 94  ALT 17  AST 29  AMYLASE --  LIPASE --  GLUCOSE 125*   Radiology/Studies: Dg Chest 2 View  11/19/2012  *RADIOLOGY REPORT*  Clinical Data: Bilateral leg swelling.  History of congestive heart failure.  CHEST - 2 VIEW  Comparison: None.  Findings: There is cardiomegaly.  Pulmonary vascularity is normal. There is a small right pleural effusion.  Chronic accentuation of the thoracic kyphosis.  IMPRESSION: Cardiomegaly with a small right pleural effusion.  Pulmonary vascularity is normal.   Original Report Authenticated By: Francene Boyers, M.D.     EKG: atrial fibrillation, 66 bpm, subtle inferior nonspecific TWIs, no ST changes   ASSESSMENT AND PLAN:   1. Acute on chronic diastolic CHF 2. Permanent atrial fibrillation 3. Morbid obesity 4. Chronic renal failure, stage 4    --  solitary kidney 5. OSA 6. Physical debility/deconditioning - wheelchair bound 7. Noncompliance 8. Hypertension, poorly controlled  Signed, R. Hurman Horn, PA-C 11/19/2012, 6:59 PM   Patient seen and examined independently. Hurman Horn, PA-C note reviewed carefully - agree with his assessment and plan. I have edited the note based on my findings.   She has marked volume overload in the setting of severe HTN, CRI and non-compliance with HF restrictions. She will need to be admitted for IV diuresis. Her primary caregiver (her daughter) had a stroke in May and it has complicated her management. (unable to weight as she can't get out of bed) Will need PT/OT in hospital and likely SNF on discharge at least for a few weeks. She has no IV access so will place central line for access and to measure  CVPs.   Will give clonidine now for HTN, if refractory can start NTG gtt. Will be in hospital at least 3-4 days at a minimum. Continue with HF education. Continue coumadin for AF.  Consider enrollment into GUIDE-IT trial at d/c.  Truman Hayward 7:49 PM

## 2012-11-19 NOTE — ED Notes (Signed)
Bilateral lower legs +4 non-pitting edema, and hypertensive; legs are dark. wnl rom.

## 2012-11-20 LAB — BASIC METABOLIC PANEL
CO2: 32 mEq/L (ref 19–32)
Calcium: 9.1 mg/dL (ref 8.4–10.5)
Creatinine, Ser: 1.94 mg/dL — ABNORMAL HIGH (ref 0.50–1.10)
GFR calc non Af Amer: 23 mL/min — ABNORMAL LOW (ref >90)
Sodium: 146 mEq/L — ABNORMAL HIGH (ref 135–145)

## 2012-11-20 LAB — CBC
HCT: 39.7 % (ref 36.0–46.0)
MCHC: 28.5 g/dL — ABNORMAL LOW (ref 30.0–36.0)
MCV: 91.3 fL (ref 78.0–100.0)
RDW: 17.4 % — ABNORMAL HIGH (ref 11.5–15.5)

## 2012-11-20 LAB — HEPARIN LEVEL (UNFRACTIONATED): Heparin Unfractionated: 0.37 IU/mL (ref 0.30–0.70)

## 2012-11-20 LAB — TROPONIN I: Troponin I: <0.3 ng/mL (ref <0.30)

## 2012-11-20 MED ORDER — METOLAZONE 5 MG PO TABS
5.0000 mg | ORAL_TABLET | Freq: Once | ORAL | Status: AC
  Administered 2012-11-20: 5 mg via ORAL
  Filled 2012-11-20: qty 1

## 2012-11-20 MED ORDER — CHLORHEXIDINE GLUCONATE CLOTH 2 % EX PADS: 6.0000 | MEDICATED_PAD | Freq: Every day | CUTANEOUS | Status: DC

## 2012-11-20 MED ORDER — SODIUM CHLORIDE 0.9 % IJ SOLN: 3.0000 mL | INTRAMUSCULAR | Status: DC | PRN

## 2012-11-20 MED ORDER — CHLORHEXIDINE GLUCONATE CLOTH 2 % EX PADS
6.0000 | MEDICATED_PAD | Freq: Every day | CUTANEOUS | Status: AC
  Administered 2012-11-20 – 2012-11-24 (×5): 6 via TOPICAL

## 2012-11-20 MED ORDER — MUPIROCIN 2 % EX OINT: 1.0000 "application " | TOPICAL_OINTMENT | Freq: Two times a day (BID) | CUTANEOUS | Status: DC

## 2012-11-20 MED ORDER — SODIUM CHLORIDE 0.9 % IJ SOLN
3.0000 mL | Freq: Two times a day (BID) | INTRAMUSCULAR | Status: DC
  Administered 2012-11-20 – 2012-11-25 (×7): 3 mL via INTRAVENOUS
  Administered 2012-11-26: 10:00:00 via INTRAVENOUS
  Administered 2012-11-26 – 2012-11-27 (×2): 3 mL via INTRAVENOUS
  Administered 2012-11-27: 10 mL via INTRAVENOUS
  Administered 2012-11-28: 10:00:00 via INTRAVENOUS
  Administered 2012-11-28 – 2012-12-05 (×12): 3 mL via INTRAVENOUS

## 2012-11-20 MED ORDER — MUPIROCIN 2 % EX OINT
1.0000 "application " | TOPICAL_OINTMENT | Freq: Two times a day (BID) | CUTANEOUS | Status: AC
  Administered 2012-11-20 – 2012-11-24 (×10): 1 via NASAL
  Filled 2012-11-20: qty 22

## 2012-11-20 MED ORDER — WARFARIN SODIUM 2 MG PO TABS
2.0000 mg | ORAL_TABLET | Freq: Once | ORAL | Status: AC
  Administered 2012-11-20: 2 mg via ORAL
  Filled 2012-11-20: qty 1

## 2012-11-20 NOTE — Progress Notes (Signed)
MRSA PCR positive. Sample collected 11/19/12. Pt placed on contact isolation as per protocol. Pt education done. Pt verbalized understanding of same.

## 2012-11-20 NOTE — Progress Notes (Signed)
Subjective:  States that she feels a lot better than she did yesterday. Her breathing is improved. No chest pain. She is currently eating breakfast.  Objective:  Vital Signs in the last 24 hours: Temp:  [97.3 F (36.3 C)-98 F (36.7 C)] 97.4 F (36.3 C) (11/30 0700) Pulse Rate:  [68-83] 76  (11/30 0700) Resp:  [14-21] 18  (11/30 0700) BP: (119-194)/(69-113) 136/85 mmHg (11/30 0700) SpO2:  [90 %-100 %] 100 % (11/30 0700) Weight:  [89.4 kg (197 lb 1.5 oz)-105.6 kg (232 lb 12.9 oz)] 103 kg (227 lb 1.2 oz) (11/30 0400)  Intake/Output from previous day: 11/29 0701 - 11/30 0700 In: 149.5 [P.O.:60; I.V.:89.5] Out: 950 [Urine:950]   Physical Exam: General: Well developed, well nourished, in no acute distress. Head:  Normocephalic and atraumatic. Difficult to appreciate JVD. Right subclavian catheter in place Lungs: Mild crackles heard at bases bilaterally. Mild wheeze Heart: Irregularly irregular, normal rate.  No murmur, rubs or gallops.  Abdomen: Obese soft, non-tender, positive bowel sounds. Extremities: No clubbing or cyanosis. 1-2+ edema lower extremities, Foley catheter in place Neurologic: Alert and oriented x 3.    Lab Results:  [facility] 11/19/12 2131  WBC 6.0  HGB 10.8*  PLT 159    Basename 11/19/12 1655  NA 143  K 4.0  CL 103  CO2 29  GLUCOSE 125*  BUN 41*  CREATININE 2.14*    Basename 11/19/12 2132  TROPONINI <0.30   Hepatic Function Panel  Basename 11/19/12 1655  PROT 7.3  ALBUMIN 3.1*  AST 29  ALT 17  ALKPHOS 94  BILITOT 0.6  BILIDIR --  IBILI --   MRSA positive swab. BNP 7488. Creatinine 2.1  -800 cc urine  Imaging: Dg Chest 2 View  11/19/2012  *RADIOLOGY REPORT*  Clinical Data: Bilateral leg swelling.  History of congestive heart failure.  CHEST - 2 VIEW  Comparison: None.  Findings: There is cardiomegaly.  Pulmonary vascularity is normal. There is a small right pleural effusion.  Chronic accentuation of the thoracic kyphosis.  IMPRESSION:  Cardiomegaly with a small right pleural effusion.  Pulmonary vascularity is normal.   Original Report Authenticated By: Francene Boyers, M.D.    Dg Chest Portable 1 View  11/19/2012  *RADIOLOGY REPORT*  Clinical Data: Central line placement.  PORTABLE CHEST - 1 VIEW  Comparison: Film earlier today at 1720 hours  Findings: Right-sided subclavian central line is in place with the tip at the cavoatrial junction.  No pneumothorax present after placement.  There is stable cardiomegaly.  Stable atelectasis at both lung bases.  IMPRESSION: Central line tip at the cavoatrial junction.  No pneumothorax identified.   Original Report Authenticated By: Irish Lack, M.D.    Personally viewed.   Telemetry: Atrial fibrillation, rate controlled, no significant pauses Personally viewed.   EKG:  Atrial fibrillation, nonspecific ST-T wave changes  Cardiac Studies:  10/2011 echo: LVEF 55-60%, moderate LVH, mod-severe biatrial enlargement, mild MR, mod-severe TR, normal RV size and function.    Assessment/Plan:   Ms. Dowdle is a 78yo female with PMHx s/f chronic diastolic CHF, congenital solitary kidney, CKD (stage IV, baseline Cr 1.5-2.4), COPD + OSA + morbid obesity ->secondary pulmonary HTN, permanent atrial fibrillation, OSA, GERD   1. Acute on chronic diastolic heart failure-symptomatically improved. Approximally 1 L diuresis thus far. Continue to push IV Lasix currently 80 mg twice a day. Goal 1-2 L per day diuresis. Monitor creatinine closely. Remove Foley when possible. CVP 13.  2. Atrial fibrillation-currently well rate controlled on  diltiazem CD 360 mg a day. No changes made. Metoprolol 25 mg twice a day also.  3. Chronic anticoagulation-Coumadin. INR subtherapeutic at 0.91. Heparin infusion until therapeutic.  4. Uncontrolled hypertension- improved after modest diuresis. Continue to monitor. Clonidine 0.3 mg administered.  5. Obesity-encourage weight loss  6. Chronic kidney disease-stage 3-4,  monitor creatinine with diuresis. Solitary kidney.    London Tarnowski 11/20/2012, 9:46 AM

## 2012-11-20 NOTE — Progress Notes (Signed)
ANTICOAGULATION CONSULT NOTE - F/U Consult  Pharmacy Consult for Heparin and Coumadin Indication: atrial fibrillation  No Known Allergies  Patient Measurements: Height: 5\' 2"  (157.5 cm) Weight: 227 lb 1.2 oz (103 kg) IBW/kg (Calculated) : 50.1  Heparin Dosing Weight: 68.6 kg  Vital Signs: Temp: 97.4 F (36.3 C) (11/30 0700) Temp src: Oral (11/30 0700) BP: 136/85 mmHg (11/30 0700) Pulse Rate: 76  (11/30 0700)  Labs:  Basename 11/20/12 0929 11/20/12 0920 11/20/12 0919 11/19/12 2132 11/19/12 2131 11/19/12 1937 11/19/12 1655  HGB -- -- 11.3* -- 10.8* -- --  HCT -- -- 39.7 -- 37.5 -- --  PLT -- -- 159 -- 159 -- --  APTT -- -- -- -- -- -- --  LABPROT 24.9* -- -- -- -- 12.2 --  INR 2.38* -- -- -- -- 0.91 --  HEPARINUNFRC 0.37 -- -- -- -- -- --  CREATININE -- -- 1.94* -- -- -- 2.14*  CKTOTAL -- -- -- -- -- -- --  CKMB -- -- -- -- -- -- --  TROPONINI -- <0.30 -- <0.30 -- -- --    Estimated Creatinine Clearance: 26.5 ml/min (by C-G formula based on Cr of 1.94).   Medical History: Past Medical History  Diagnosis Date  . Morbid obesity   . Atrial fibrillation   . Arthritis   . Allergy history unknown   . Hypertension     Poorly controlled  . CHF (congestive heart failure)   . Pulmonary hypertension   . GERD (gastroesophageal reflux disease)   . COPD (chronic obstructive pulmonary disease)   . OA (osteoarthritis)     Assessment: 78 y.o female [facility] ED with shortness of breath.  PTA she is on coumadin for PAF (dose reported to be 2.5 mg qMWF and 3.75 mg qTTSS-last taken on 11/18/12).  INR =0.91 on admit--patient was put on heparin and Coumadin concurrently until INR therapeutic. INR this morning is therapeutic and heparin was d/c'd. Patient admits to occasionally missing some coumadin doses.  INR jumped generously from 0.91 to 2.38 in one day after a 5mg  dose. No bleeding reported. H/h and plts stable. Will continue with a dose lower than patient's home dose for now  to make sure she does not get supratherapeutic   Goal of Therapy:  Monitor platelets by anticoagulation protocol: Yes INR = 2-3   Plan: .  Coumadin 2.0 mg po tonight x1 Daily heparin level, INR and CBC.  Thank you, Franchot Erichsen, Pharm.D. Clinical Pharmacist   11/20/2012 11:23 AM

## 2012-11-20 NOTE — Progress Notes (Signed)
Pt slept most of shift. Reports improvement in hand dexterity with decrease in edema.Sats >90% No dyspnea during eating or family phone calls. Fluid restriction in place.

## 2012-11-21 LAB — CBC
HCT: 36.3 % (ref 36.0–46.0)
MCV: 91.2 fL (ref 78.0–100.0)
RDW: 17.4 % — ABNORMAL HIGH (ref 11.5–15.5)
WBC: 8 10*3/uL (ref 4.0–10.5)

## 2012-11-21 LAB — BASIC METABOLIC PANEL
BUN: 38 mg/dL — ABNORMAL HIGH (ref 6–23)
Calcium: 9 mg/dL (ref 8.4–10.5)
GFR calc non Af Amer: 23 mL/min — ABNORMAL LOW (ref >90)
Glucose, Bld: 95 mg/dL (ref 70–99)
Potassium: 4.5 mEq/L (ref 3.5–5.1)

## 2012-11-21 MED ORDER — WARFARIN SODIUM 2 MG PO TABS
2.0000 mg | ORAL_TABLET | Freq: Once | ORAL | Status: AC
  Filled 2012-11-21: qty 1

## 2012-11-21 MED ORDER — HYDRALAZINE HCL 25 MG PO TABS
25.0000 mg | ORAL_TABLET | Freq: Three times a day (TID) | ORAL | Status: DC
  Administered 2012-11-21 – 2012-11-22 (×3): 25 mg via ORAL
  Filled 2012-11-21 (×7): qty 1

## 2012-11-21 NOTE — Evaluation (Signed)
Physical Therapy Evaluation Patient Details Name: Desiree Pierce MRN: [id number] DOB: 1931-05-17 Today's Date: 11/21/2012 Time: 1000-1023 PT Time Calculation (min): 23 min  PT Assessment / Plan / Recommendation Clinical Impression  Patient is a 78 yo female admitted with CHF and edema all extremities.  Patient with unclear explaination of prior level of functioning.  However appears to be primarily transfers to lift chair (where she spends most of the day) and to her w/c.  Patient with overall weakness and arthritic pain impacting mobility.  Patient will benefit from acute PT to maximize independence prior to return home.      PT Assessment  Patient needs continued PT services    Follow Up Recommendations  Home health PT;Supervision/Assistance - 24 hour (Home health aide)    Does the patient have the potential to tolerate intense rehabilitation      Barriers to Discharge        Equipment Recommendations  None recommended by PT    Recommendations for Other Services     Frequency Min 3X/week    Precautions / Restrictions Precautions Precautions: Fall Restrictions Weight Bearing Restrictions: No   Pertinent Vitals/Pain       Mobility  Bed Mobility Bed Mobility: Not assessed Transfers Transfers: Sit to Stand;Stand to Sit Sit to Stand: 3: Mod assist;With upper extremity assist;From chair/3-in-1 Stand to Sit: 3: Mod assist;With upper extremity assist;To chair/3-in-1 Details for Transfer Assistance: Patient stood x 2.  Verbal cues for hand placement.  Patient has difficulty using hands to push up due to arthritic changes and edema.  Patient uses momentum of rocking to help come to standing. (At home has lift chair) Ambulation/Gait Ambulation/Gait Assistance: 1: +2 Total assist Ambulation/Gait: Patient Percentage: 50% Ambulation Distance (Feet): 1 Feet Assistive device: Rolling walker Ambulation/Gait Assistance Details: Patient attempted to take steps.  Unable to lift LE's off  floor to step forward.  Did scoot LE's backward toward chair to safely return to sitting.           PT Diagnosis: Difficulty walking;Generalized weakness  PT Problem List: Decreased strength;Decreased range of motion;Decreased activity tolerance;Decreased balance;Decreased mobility;Decreased knowledge of use of DME;Cardiopulmonary status limiting activity;Obesity;Pain PT Treatment Interventions: DME instruction;Gait training;Functional mobility training;Therapeutic activities;Patient/family education   PT Goals Acute Rehab PT Goals PT Goal Formulation: With patient Time For Goal Achievement: 11/28/12 Potential to Achieve Goals: Good Pt will go Supine/Side to Sit: Independently;with HOB 0 degrees PT Goal: Supine/Side to Sit - Progress: Goal set today Pt will go Sit to Supine/Side: Independently;with HOB 0 degrees PT Goal: Sit to Supine/Side - Progress: Goal set today Pt will go Sit to Stand: with supervision PT Goal: Sit to Stand - Progress: Goal set today Pt will Transfer Bed to Chair/Chair to Bed: with supervision PT Transfer Goal: Bed to Chair/Chair to Bed - Progress: Goal set today Pt will Ambulate: 1 - 15 feet;with min assist;with rolling walker PT Goal: Ambulate - Progress: Goal set today  Visit Information  Last PT Received On: 11/21/12 Assistance Needed: +2    Subjective Data  Subjective: "My daughter is in the rehab unit.  She had a stroke (months ago)"  Patient reports her son-in-law and a friend help her.  And that they are "looking into" an aid. Patient Stated Goal: To return home.   Prior Functioning  Home Living Lives With: Daughter (Son-in-law; (daughter in hospital - CVA)) Available Help at Discharge: Family;Available 24 hours/day (Family looking into aide) Type of Home: House Home Access: Stairs to enter [employer] of  Steps: 1 Entrance Stairs-Rails: None Home Layout: One level Bathroom Shower/Tub: Health visitor:  Standard Bathroom Accessibility: Yes How Accessible: Accessible via walker Home Adaptive Equipment: Bedside commode/3-in-1;Grab bars in shower;Shower chair with back;Walker - four wheeled;Wheelchair - manual (Lift chair) Prior Function Level of Independence: Independent with assistive device(s);Needs assistance Needs Assistance: Bathing;Meal Prep;Light Housekeeping;Gait;Transfers Bath: Minimal Meal Prep: Maximal Light Housekeeping: Total Gait Assistance: Min assist  Transfer Assistance: Was independent at one point, recently needs min assist Able to Take Stairs?: No Driving: No Vocation: Retired Comments: Patient's history not consistent.  States she needed assist to walk.  Then later stated she walked very short distances on own. Communication Communication: No difficulties    Cognition  Overall Cognitive Status: Impaired Area of Impairment: Safety/judgement Arousal/Alertness: Awake/alert Orientation Level: Appears intact for tasks assessed Behavior During Session: [facility] for tasks performed Safety/Judgement: Decreased safety judgement for tasks assessed;Decreased awareness of need for assistance    Extremity/Trunk Assessment Right Upper Extremity Assessment RUE ROM/Strength/Tone: Deficits RUE ROM/Strength/Tone Deficits: Decreased ROM - significant arthritic changes in hands and UE's; strength 4-/5 RUE Sensation: WFL - Light Touch Left Upper Extremity Assessment LUE ROM/Strength/Tone: Deficits LUE ROM/Strength/Tone Deficits: Decreased ROM - significant arthritic changes in hands and UE's; strength 4-/5 LUE Sensation: WFL - Light Touch Right Lower Extremity Assessment RLE ROM/Strength/Tone: Deficits RLE ROM/Strength/Tone Deficits: Strength 4-/5; arthritic changes; edema RLE Sensation: WFL - Light Touch Left Lower Extremity Assessment LLE ROM/Strength/Tone: Deficits LLE ROM/Strength/Tone Deficits: Strength 4-/5; arthritic changes; edema LLE Sensation: WFL - Light Touch    Balance    End of Session PT - End of Session Equipment Utilized During Treatment: Gait belt;Oxygen Activity Tolerance: Patient limited by fatigue Patient left: in chair;with call bell/phone within reach Nurse Communication: Mobility status  GP     Vena Austria 11/21/2012, 1:15 PM Durenda Hurt. Renaldo Fiddler, [facility] Acute Rehab Services Pager [phone]

## 2012-11-21 NOTE — Progress Notes (Signed)
Subjective:  Feels much better. Less SOB, decreased edema. No CP. Arthritic hands.   Objective:  Vital Signs in the last 24 hours: Temp:  [97.8 F (36.6 C)-99.1 F (37.3 C)] 98 F (36.7 C) (12/01 0827) Pulse Rate:  [74-88] 87  (12/01 0500) Resp:  [13-30] 13  (12/01 0500) BP: (154-182)/(89-145) 154/91 mmHg (12/01 0500) SpO2:  [96 %-100 %] 97 % (12/01 0500) Weight:  [103 kg (227 lb 1.2 oz)] 103 kg (227 lb 1.2 oz) (12/01 0500)  Intake/Output from previous day: 11/30 0701 - 12/01 0700 In: 1090 [P.O.:770; I.V.:320] Out: 2600 [Urine:2600]   Physical Exam: General: Well developed, well nourished, in no acute distress.  Head: Normocephalic and atraumatic. Difficult to appreciate JVD. Right subclavian catheter in place  Lungs: Mild crackles heard at bases bilaterally. Mild wheeze  Heart: Irregularly irregular, normal rate. No murmur, rubs or gallops.  Abdomen: Obese soft, non-tender, positive bowel sounds.  Extremities: No clubbing or cyanosis. 1-2+ edema lower. Flexed DIP's. Foley catheter in place  Neurologic: Alert and oriented x 3.    Lab Results:  Basename 11/21/12 0500 11/20/12 0919  WBC 8.0 5.4  HGB 10.6* 11.3*  PLT 159 159    Basename 11/21/12 0500 11/20/12 0919  NA 145 146*  K 4.5 4.2  CL 105 106  CO2 34* 32  GLUCOSE 95 85  BUN 38* 37*  CREATININE 1.98* 1.94*    Basename 11/20/12 0920 11/19/12 2132  TROPONINI <0.30 <0.30   Hepatic Function Panel  Basename 11/19/12 1655  PROT 7.3  ALBUMIN 3.1*  AST 29  ALT 17  ALKPHOS 94  BILITOT 0.6  BILIDIR --  IBILI --    Imaging: Dg Chest 2 View  11/19/2012  *RADIOLOGY REPORT*  Clinical Data: Bilateral leg swelling.  History of congestive heart failure.  CHEST - 2 VIEW  Comparison: None.  Findings: There is cardiomegaly.  Pulmonary vascularity is normal. There is a small right pleural effusion.  Chronic accentuation of the thoracic kyphosis.  IMPRESSION: Cardiomegaly with a small right pleural effusion.   Pulmonary vascularity is normal.   Original Report Authenticated By: Francene Boyers, M.D.    Dg Chest Portable 1 View  11/19/2012  *RADIOLOGY REPORT*  Clinical Data: Central line placement.  PORTABLE CHEST - 1 VIEW  Comparison: Film earlier today at 1720 hours  Findings: Right-sided subclavian central line is in place with the tip at the cavoatrial junction.  No pneumothorax present after placement.  There is stable cardiomegaly.  Stable atelectasis at both lung bases.  IMPRESSION: Central line tip at the cavoatrial junction.  No pneumothorax identified.   Original Report Authenticated By: Irish Lack, M.D.    Personally viewed.   Telemetry: AFIB rate cont.   Personally viewed.   Cardiac Studies:  10/2011 echo: LVEF 55-60%, moderate LVH, mod-severe biatrial enlargement, mild MR, mod-severe TR, normal RV size and function.    Assessment/Plan:   Ms. [name] is a 78yo female with PMHx s/f chronic diastolic CHF, congenital solitary kidney, CKD (stage IV, baseline Cr 1.5-2.4), COPD + OSA + morbid obesity ->secondary pulmonary HTN, permanent atrial fibrillation, OSA, GERD   1. Acute on chronic diastolic heart failure-symptomatically improved. Approximally 2.5 L diuresis thus far. Continue to push IV Lasix currently 80 mg twice a day. Goal 1-2 L per day diuresis. Monitor creatinine closely. Remove Foley when possible. CVP 13 to 27 (doubt accuracy). Received metolazone 5mg  one dose yesterday.    2. Atrial fibrillation-currently well rate controlled on diltiazem CD 360 mg a  day. No changes made. Metoprolol 25 mg twice a day also.   3. Chronic anticoagulation-Coumadin. INR subtherapeutic at 0.91. Heparin infusion until therapeutic.   4. Uncontrolled hypertension- improved after modest diuresis. Continue to monitor. Clonidine 0.3 mg administered. Will add hydralazine.   5. Obesity-encourage weight loss   6. Chronic kidney disease-stage 3-4, monitor creatinine with diuresis. Solitary kidney. Creat  stable.       Desiree Pierce 11/21/2012, 9:07 AM

## 2012-11-21 NOTE — Progress Notes (Signed)
ANTICOAGULATION CONSULT NOTE - F/U Consult  Pharmacy Consult for Coumadin Indication: atrial fibrillation  No Known Allergies  Patient Measurements: Height: 5\' 2"  (157.5 cm) Weight: 227 lb 1.2 oz (103 kg) IBW/kg (Calculated) : 50.1  Heparin Dosing Weight: 68.6 kg  Vital Signs: Temp: 98 F (36.7 C) (12/01 0827) Temp src: Oral (12/01 0827) BP: 154/91 mmHg (12/01 0500) Pulse Rate: 87  (12/01 0500)  Labs:  Basename 11/21/12 0500 11/20/12 0929 11/20/12 0920 11/20/12 0919 11/19/12 2132 11/19/12 2131 11/19/12 1937 11/19/12 1655  HGB 10.6* -- -- 11.3* -- -- -- --  HCT 36.3 -- -- 39.7 -- 37.5 -- --  PLT 159 -- -- 159 -- 159 -- --  APTT -- -- -- -- -- -- -- --  LABPROT 28.6* 24.9* -- -- -- -- 12.2 --  INR 2.87* 2.38* -- -- -- -- 0.91 --  HEPARINUNFRC -- 0.37 -- -- -- -- -- --  CREATININE 1.98* -- -- 1.94* -- -- -- 2.14*  CKTOTAL -- -- -- -- -- -- -- --  CKMB -- -- -- -- -- -- -- --  TROPONINI -- -- <0.30 -- <0.30 -- -- --    Estimated Creatinine Clearance: 25.9 ml/min (by C-G formula based on Cr of 1.98).   Assessment: 78 y.o female presents to [facility] ED with shortness of breath.  PTA she is on coumadin for PAF (dose reported to be 2.5 mg qMWF and 3.75 mg qTTSS-last taken on 11/18/12).  INR =0.91 on admit--patient was put on heparin and Coumadin concurrently until INR therapeutic. INR this morning is therapeutic and heparin was d/c'd. Patient admits to occasionally missing some coumadin doses.  INR jumped generously from 0.91 to 2.38 to 2.87  5mg  and 2mg  doses. No bleeding reported. H/h and plts stable. Will continue with a dose lower than patient's home dose for now to attempt to keep her INR therapeutic.  Goal of Therapy:  Monitor platelets by anticoagulation protocol: Yes INR = 2-3   Plan: .  Coumadin 2mg  po tonight x1 Daily INR.  Celedonio Miyamoto, PharmD, BCPS Clinical Pharmacist Pager [phone]  11/21/2012 9:39 AM

## 2012-11-21 NOTE — Progress Notes (Signed)
Utilization review completed.  

## 2012-11-22 ENCOUNTER — Encounter (HOSPITAL_COMMUNITY): Payer: Self-pay | Admitting: Anesthesiology

## 2012-11-22 DIAGNOSIS — I059 Rheumatic mitral valve disease, unspecified: Secondary | ICD-10-CM

## 2012-11-22 LAB — TSH: TSH: 0.654 u[IU]/mL (ref 0.350–4.500)

## 2012-11-22 LAB — CBC
HCT: 34.2 % — ABNORMAL LOW (ref 36.0–46.0)
Hemoglobin: 10 g/dL — ABNORMAL LOW (ref 12.0–15.0)
MCH: 26.7 pg (ref 26.0–34.0)
MCV: 91.4 fL (ref 78.0–100.0)
Platelets: 135 10*3/uL — ABNORMAL LOW (ref 150–400)
RBC: 3.74 MIL/uL — ABNORMAL LOW (ref 3.87–5.11)

## 2012-11-22 LAB — BASIC METABOLIC PANEL
BUN: 42 mg/dL — ABNORMAL HIGH (ref 6–23)
Chloride: 102 mEq/L (ref 96–112)
Creatinine, Ser: 2 mg/dL — ABNORMAL HIGH (ref 0.50–1.10)
GFR calc Af Amer: 26 mL/min — ABNORMAL LOW (ref >90)
Glucose, Bld: 91 mg/dL (ref 70–99)

## 2012-11-22 MED ORDER — WARFARIN SODIUM 2.5 MG PO TABS
2.5000 mg | ORAL_TABLET | Freq: Once | ORAL | Status: AC
  Administered 2012-11-22: 2.5 mg via ORAL
  Filled 2012-11-22: qty 1

## 2012-11-22 MED ORDER — HYDRALAZINE HCL 25 MG PO TABS
25.0000 mg | ORAL_TABLET | Freq: Once | ORAL | Status: AC
  Administered 2012-11-22: 25 mg via ORAL
  Filled 2012-11-22: qty 1

## 2012-11-22 MED ORDER — HYDRALAZINE HCL 50 MG PO TABS
50.0000 mg | ORAL_TABLET | Freq: Three times a day (TID) | ORAL | Status: DC
  Administered 2012-11-22 – 2012-11-23 (×3): 50 mg via ORAL
  Filled 2012-11-22 (×6): qty 1

## 2012-11-22 MED ORDER — METOLAZONE 5 MG PO TABS
5.0000 mg | ORAL_TABLET | Freq: Every day | ORAL | Status: DC
  Administered 2012-11-22 – 2012-11-28 (×7): 5 mg via ORAL
  Filled 2012-11-22 (×9): qty 1

## 2012-11-22 MED ORDER — TRAMADOL HCL 50 MG PO TABS
50.0000 mg | ORAL_TABLET | Freq: Two times a day (BID) | ORAL | Status: DC | PRN
  Administered 2012-11-22 – 2012-12-10 (×10): 50 mg via ORAL
  Filled 2012-11-22 (×10): qty 1

## 2012-11-22 MED ORDER — METOLAZONE 5 MG PO TABS
5.0000 mg | ORAL_TABLET | Freq: Once | ORAL | Status: DC
  Filled 2012-11-22: qty 1

## 2012-11-22 NOTE — Progress Notes (Signed)
*  PRELIMINARY RESULTS* Echocardiogram 2D Echocardiogram has been performed.  Desiree Pierce 11/22/2012, 3:02 PM

## 2012-11-22 NOTE — Progress Notes (Signed)
Physical Therapy Treatment Patient Details Name: Sierah Lacewell MRN: [id number] DOB: [birth year] Today's Date: 11/22/2012 Time: 4098-1191 PT Time Calculation (min): 26 min  PT Assessment / Plan / Recommendation Comments on Treatment Session  Pt. admitted with with CHF and edema all extremities; Pt. making progress with ambulation but complains of pain throughout secondary to arthritis. However pt. willing to perform all mobility asked of her and was very pleasant. MD entered room during tx and discussed d/c with pt; pt. agreeable to SNF. Pt. does not have 24 hour assistance at home and is unsafe with mobility at this time.     Follow Up Recommendations  SNF;Supervision/Assistance - 24 hour           Equipment Recommendations  None recommended by PT       Frequency Min 3X/week   Plan Discharge plan remains appropriate;Frequency remains appropriate    Precautions / Restrictions Precautions Precautions: Fall Restrictions Weight Bearing Restrictions: No   Pertinent Vitals/Pain O2 98% on room air BP 155/89 HR 93    Mobility  Bed Mobility Bed Mobility: Not assessed Details for Bed Mobility Assistance: Pt. sitting in chair.  Transfers Sit to Stand: 3: Mod assist;With armrests;From chair/3-in-1 Stand to Sit: 3: Mod assist;With armrests;To chair/3-in-1 Details for Transfer Assistance: Performed x3 all from chair; On first attempt pt. stood then immediately sat back down stating that her legs could not take the weight. Pt. required verbal and tactile cueing for hand placement and verbal cueing for sequencing. Pt. uses rocking momentum to stand. Physical assist for control of descent when sitting. Ambulation/Gait Ambulation/Gait Assistance: 4: Min assist Ambulation Distance (Feet): 8 Feet (6'; 8') Assistive device: Rolling walker Ambulation/Gait Assistance Details: Performed x2 within room; Pt. required cueing for upright posture. Pt. complains of pain in hands secondary to arthritis.  Chair follow behind pt. Gait Pattern: Step-to pattern;Decreased stride length;Trunk flexed Gait velocity: decreased Stairs: No      PT Goals Acute Rehab PT Goals PT Goal: Sit to Stand - Progress: Progressing toward goal PT Goal: Ambulate - Progress: Progressing toward goal  Visit Information  Last PT Received On: 11/22/12 Assistance Needed: +2 (safety with ambulation)    Subjective Data  Subjective: "My hands are painful from arthritis."   Cognition  Overall Cognitive Status: Impaired Area of Impairment: Safety/judgement Arousal/Alertness: Awake/alert Orientation Level: Appears intact for tasks assessed Behavior During Session: [facility] for tasks performed Safety/Judgement: Decreased safety judgement for tasks assessed       End of Session PT - End of Session Equipment Utilized During Treatment: Gait belt Activity Tolerance: Patient tolerated treatment well Patient left: in chair;with call bell/phone within reach Nurse Communication: Mobility status     Army Chaco SPT 11/22/2012, 9:02 AM [name], PT [phone]

## 2012-11-22 NOTE — Progress Notes (Signed)
ANTICOAGULATION CONSULT NOTE - Follow Up Consult  Pharmacy Consult for Coumadin Indication: atrial fibrillation  No Known Allergies  Patient Measurements: Height: 5\' 2"  (157.5 cm) Weight: 222 lb 3.6 oz (100.8 kg) IBW/kg (Calculated) : 50.1   Vital Signs: Temp: 98.1 F (36.7 C) (12/02 0759) Temp src: Oral (12/02 0759) BP: 155/89 mmHg (12/02 0829) Pulse Rate: 93  (12/02 0829)  Labs:  Basename 11/22/12 0500 11/21/12 0500 11/20/12 0929 11/20/12 0920 11/20/12 0919 11/19/12 2132  HGB 10.0* 10.6* -- -- -- --  HCT 34.2* 36.3 -- -- 39.7 --  PLT 135* 159 -- -- 159 --  APTT -- -- -- -- -- --  LABPROT 26.9* 28.6* 24.9* -- -- --  INR 2.64* 2.87* 2.38* -- -- --  HEPARINUNFRC -- -- 0.37 -- -- --  CREATININE 2.00* 1.98* -- -- 1.94* --  CKTOTAL -- -- -- -- -- --  CKMB -- -- -- -- -- --  TROPONINI -- -- -- <0.30 -- <0.30    Estimated Creatinine Clearance: 25.3 ml/min (by C-G formula based on Cr of 2).  Assessment: 89 yof admitted for CHF exacerbation continues on coumadin for afib with a therapeutic INR. INR appears to be stabilizing with lower doses after initial jump. Will attemp to re-initiate home dose. NO bleeding reported. CBC low but stable.  11/29, INR 0.91, 5mg  11/30, INR 2.38, 2mg  12/1, INR 2.87, 2mg  12/2, INR 2.64  Home dose 2.5mg  MWF and 3.75mg  TTSS  Goal of Therapy:  INR 2-3 Monitor platelets by anticoagulation protocol: Yes   Plan:  1) Coumadin 2.5mg  x 1 2) Follow up INR in AM  Fredrik Rigger 11/22/2012,9:21 AM

## 2012-11-22 NOTE — Progress Notes (Signed)
Advanced Heart Failure Rounding Note   Subjective:    Desiree Pierce is a 78yo female with PMHx s/f chronic diastolic CHF, congenital solitary kidney, CKD (stage IV, baseline Cr 1.5-2.4), COPD + OSA + morbid obesity ->secondary pulmonary HTN, permanent atrial fibrillation, OSA, GERD who presents to [facility] ED with shortness of breath.   She last followed up in CHF clinic in 05/2012. Baseline weight 195 lbs. Denied weighing daily. Denied needing  increased Lasix. Drinking 2L fluid/day. Fluid restriction enforced. Two month follow-up recommended, but this is not documented.  10/2011 echo: LVEF 55-60%, moderate LVH, mod-severe biatrial enlargement, mild MR, mod-severe TR, normal RV size and function.   She reports experiencing progressive SOB and worsening LE edema. She has noticed a subjective weight increase. Denies chest pain, palpitations, PND, orthopnea. She does endorse new cough productive of white sputum. She is quite limited by arthritis, and is essentially wheelchair bound. She lives alone and no longer received home health services. She has not followed up in the CHF clinic since June. She admits to occasional dietary indiscretion, including Thanksgiving yesterday. No recent illness, fevers or chills. No active bleeding. Reports compliance with Lasix and Coumadin. Daughter is quite concerned about living situation.   In the ED, EKG reveals nonspecific inferior subtle TWIs. No ST changes. POC TnI 0.02. pBNP 7488.0 .CXR reveals cardiomegaly, small R pleural effusion, otherwise unremarkable. BMET reveals BUN 41, Cr 2.14. Admit weight 232 pounds.  11/19/12 Started on Lasix 80 mg IV bid. 11/21/12 received IV lasix and Metolalzone 5 mg x1. SBP remained > 150 hydralazine 25 mg every 8 hours added.  I/O since admit - 5.6 liters. Down 10 pounds.   Feels much better. SOB with exertion.         Objective:   Weight Range:  Vital Signs:   Temp:  [98 F (36.7 C)-98.6 F (37 C)] 98.1 F (36.7 C)  (12/02 0759) Pulse Rate:  [72-85] 85  (12/02 0759) Resp:  [16-23] 18  (12/02 0759) BP: (129-159)/(87-96) 157/87 mmHg (12/02 0759) SpO2:  [93 %-98 %] 98 % (12/02 0759) Weight:  [222 lb 3.6 oz (100.8 kg)] 222 lb 3.6 oz (100.8 kg) (12/02 0500) Last BM Date: 11/21/12  Weight change: Filed Weights   11/20/12 0400 11/21/12 0500 11/22/12 0500  Weight: 227 lb 1.2 oz (103 kg) 227 lb 1.2 oz (103 kg) 222 lb 3.6 oz (100.8 kg)    Intake/Output:   Intake/Output Summary (Last 24 hours) at 11/22/12 0824 Last data filed at 11/22/12 0800  Gross per 24 hour  Intake   1400 ml  Output   4750 ml  Net  -3350 ml     Physical Exam: CVP  22 General:  Chronically ill appearing. No resp difficulty HEENT: normal Neck: supple. JVP to ear. Carotids 2+ bilat; no bruits. No lymphadenopathy or thryomegaly appreciated. Cor: PMI nondisplaced. Regular rate & rhythm. No rubs, gallops or murmurs. Lungs: clear Abdomen: soft, nontender, nondistended. No hepatosplenomegaly. No bruits or masses. Good bowel sounds. Extremities: no cyanosis, clubbing, rash, R and LLE 2+edema Neuro: alert & orientedx3, cranial nerves grossly intact. moves all 4 extremities w/o difficulty. Affect pleasant  Telemetry:   Labs: Basic Metabolic Panel:  Lab 11/22/12 1610 11/21/12 0500 11/20/12 0919 11/19/12 2131 11/19/12 1655  NA 145 145 146* -- 143  K 4.5 4.5 4.2 -- 4.0  CL 102 105 106 -- 103  CO2 35* 34* 32 -- 29  GLUCOSE 91 95 85 -- 125*  BUN 42* 38* 37* -- 41*  CREATININE 2.00* 1.98* 1.94* -- 2.14*  CALCIUM 9.3 9.0 9.1 -- --  MG -- -- -- 2.1 --  PHOS -- -- -- -- --    Liver Function Tests:  Lab 11/19/12 1655  AST 29  ALT 17  ALKPHOS 94  BILITOT 0.6  PROT 7.3  ALBUMIN 3.1*   No results found for this basename: LIPASE:5,AMYLASE:5 in the last 168 hours No results found for this basename: AMMONIA:3 in the last 168 hours  CBC:  Lab 11/22/12 0500 11/21/12 0500 11/20/12 0919 11/19/12 2131  WBC 6.8 8.0 5.4 6.0    NEUTROABS -- -- -- --  HGB 10.0* 10.6* 11.3* 10.8*  HCT 34.2* 36.3 39.7 37.5  MCV 91.4 91.2 91.3 89.7  PLT 135* 159 159 159    Cardiac Enzymes:  Lab 11/20/12 0920 11/19/12 2132  CKTOTAL -- --  CKMB -- --  CKMBINDEX -- --  TROPONINI <0.30 <0.30    BNP: BNP (last 3 results)  Basename 11/19/12 1655  PROBNP 7488.0*     Other results:  EKG:   Imaging: No results found.   Medications:     Scheduled Medications:    . Chlorhexidine Gluconate Cloth  6 each Topical Q0600  . cloNIDine  0.3 mg Oral Daily  . diltiazem  360 mg Oral Daily  . furosemide  80 mg Intravenous BID  . hydrALAZINE  50 mg Oral Q8H  . metolazone  5 mg Oral Daily  . metoprolol tartrate  25 mg Oral BID  . mupirocin ointment  1 application Nasal BID  . potassium chloride  40 mEq Oral BID  . senna-docusate  1 tablet Oral Daily  . sodium chloride  3 mL Intravenous Q12H  . warfarin  2 mg Oral ONCE-1800  . Warfarin - Pharmacist Dosing Inpatient   Does not apply q1800  . [DISCONTINUED] hydrALAZINE  25 mg Oral Q8H  . [DISCONTINUED] metolazone  5 mg Oral Once    Infusions:    PRN Medications: sodium chloride, acetaminophen, ALPRAZolam, ondansetron (ZOFRAN) IV, sodium chloride, zolpidem   Assessment:   1. Acute on chronic diastolic CHF  2. Permanent atrial fibrillation  3. Morbid obesity  4. Chronic renal failure, stage 4  --solitary kidney  5. OSA  6. Physical debility/deconditioning - wheelchair bound  7. Noncompliance  8. Hypertension, poorly controlled   Plan/Discussion:   Good diuresis yesterday but volume status remains elevated. CVP 22.  Renal function remains within her baseline. Continue IV lasix and add Metolazone 5 mg daily, did well with this regimen yesterday. Place ted hose  SBP remains > 150. Increase Hydralazine to 50 mg tid. Would like SBP < 140.   Coumadin per pharmacy. INR 2.6  Consult [facility] placement. She does not have 24 hour care at home and she requires 2 person  assist to stand.   Length of Stay: 3   Marca Ancona 11/22/2012, 8:24 AM

## 2012-11-22 NOTE — Progress Notes (Signed)
Clinical Social Work Department CLINICAL SOCIAL WORK PLACEMENT NOTE 11/22/2012  Patient:  Desiree Pierce, Desiree Pierce  Account Number:  [id number] Admit date:  11/19/2012  Clinical Social Worker:  Robin Searing  Date/time:  11/22/2012 01:27 PM  Clinical Social Work is seeking post-discharge placement for this patient at the following level of care:   SKILLED NURSING   (*CSW will update this form in Epic as items are completed)   11/22/2012  Patient/family provided with [facility] Department of Clinical Social Work's list of facilities offering this level of care within the geographic area requested by the patient (or if unable, by the patient's family).  11/22/2012  Patient/family informed of their freedom to choose among providers that offer the needed level of care, that participate in Medicare, Medicaid or managed care program needed by the patient, have an available bed and are willing to accept the patient.  11/22/2012  Patient/family informed of [facility]' ownership interest in [facility], as well as of the fact that they are under no obligation to receive care at this facility.  PASARR submitted to EDS on 11/22/2012 PASARR number received from EDS on 11/22/2012  FL2 transmitted to all facilities in geographic area requested by pt/family on  11/22/2012 FL2 transmitted to all facilities within larger geographic area on   Patient informed that his/her managed care company has contracts with or will negotiate with  certain facilities, including the following:     Patient/family informed of bed offers received:   Patient chooses bed at  Physician recommends and patient chooses bed at    Patient to be transferred to  on   Patient to be transferred to facility by   The following physician request were entered in Epic:   Additional Comments:  Reece Levy, MSW, Theresia Majors [phone]

## 2012-11-22 NOTE — Clinical Social Work Psychosocial (Signed)
     Clinical Social Work Department BRIEF PSYCHOSOCIAL ASSESSMENT 11/22/2012  Patient:  BAYLIE, DRAKES     Account Number:  [id number]     Admit date:  11/19/2012  Clinical Social Worker:  Robin Searing  Date/Time:  11/22/2012 01:09 PM  Referred by:  Physician  Date Referred:  11/22/2012 Referred for  SNF Placement   Other Referral:   Interview type:  Other - See comment Other interview type:    PSYCHOSOCIAL DATA Living Status:  FAMILY Admitted from facility:   Level of care:   Primary support name:  son inlaw- Rod [phone] daughters- Primary support relationship to patient:  FAMILY Degree of support available:   good/ minimal physical assist    CURRENT CONCERNS Current Concerns  Post-Acute Placement   Other Concerns:    SOCIAL WORK ASSESSMENT / PLAN Patient resides here in [place] with her son in law- Rod who tells me his wife/patient's daughter- Gavin Pound is actually in a SNF (had a stroke) and the other daughter is here visiting from out of town Production assistant, radio)    I discussed SNF with patient and she is agreeable to the plan-   Assessment/plan status:  Other - See comment Other assessment/ plan:   FL2 for snf search   Information/referral to community resources:   SNF    PATIENTS/FAMILYS RESPONSE TO PLAN OF CARE: Patient voices interest in [facility]- I will look into this and advise on availability-

## 2012-11-23 LAB — CBC
MCV: 90.3 fL (ref 78.0–100.0)
Platelets: 145 10*3/uL — ABNORMAL LOW (ref 150–400)
RBC: 4.03 MIL/uL (ref 3.87–5.11)
RDW: 17.2 % — ABNORMAL HIGH (ref 11.5–15.5)
WBC: 8.4 10*3/uL (ref 4.0–10.5)

## 2012-11-23 LAB — BASIC METABOLIC PANEL
CO2: 36 mEq/L — ABNORMAL HIGH (ref 19–32)
Chloride: 100 mEq/L (ref 96–112)
Creatinine, Ser: 2.09 mg/dL — ABNORMAL HIGH (ref 0.50–1.10)
GFR calc Af Amer: 25 mL/min — ABNORMAL LOW (ref >90)
Potassium: 4.5 mEq/L (ref 3.5–5.1)
Sodium: 146 mEq/L — ABNORMAL HIGH (ref 135–145)

## 2012-11-23 LAB — PROTIME-INR: INR: 2.69 — ABNORMAL HIGH (ref 0.00–1.49)

## 2012-11-23 MED ORDER — FUROSEMIDE 10 MG/ML IJ SOLN
80.0000 mg | Freq: Three times a day (TID) | INTRAMUSCULAR | Status: DC
  Administered 2012-11-23 – 2012-11-28 (×16): 80 mg via INTRAVENOUS
  Filled 2012-11-23 (×16): qty 8

## 2012-11-23 MED ORDER — HYDRALAZINE HCL 50 MG PO TABS
75.0000 mg | ORAL_TABLET | Freq: Three times a day (TID) | ORAL | Status: DC
  Administered 2012-11-23 – 2012-12-02 (×25): 75 mg via ORAL
  Filled 2012-11-23 (×33): qty 1

## 2012-11-23 MED ORDER — WARFARIN SODIUM 2.5 MG PO TABS
2.5000 mg | ORAL_TABLET | Freq: Once | ORAL | Status: AC
  Administered 2012-11-23: 2.5 mg via ORAL
  Filled 2012-11-23: qty 1

## 2012-11-23 NOTE — Progress Notes (Signed)
ANTICOAGULATION CONSULT NOTE - Follow Up Consult  Pharmacy Consult for Coumadin Indication: atrial fibrillation  No Known Allergies  Patient Measurements: Height: 5\' 2"  (157.5 cm) Weight: 220 lb 3.8 oz (99.9 kg) IBW/kg (Calculated) : 50.1   Vital Signs: Temp: 98.1 F (36.7 C) (12/03 0400) Temp src: Oral (12/03 0400) BP: 157/95 mmHg (12/03 0500) Pulse Rate: 31  (12/03 0500)  Labs:  Basename 11/23/12 0521 11/22/12 0500 11/21/12 0500 11/20/12 0929 11/20/12 0920  HGB 10.6* 10.0* -- -- --  HCT 36.4 34.2* 36.3 -- --  PLT 145* 135* 159 -- --  APTT -- -- -- -- --  LABPROT 27.3* 26.9* 28.6* -- --  INR 2.69* 2.64* 2.87* -- --  HEPARINUNFRC -- -- -- 0.37 --  CREATININE 2.09* 2.00* 1.98* -- --  CKTOTAL -- -- -- -- --  CKMB -- -- -- -- --  TROPONINI -- -- -- -- <0.30    Estimated Creatinine Clearance: 24.1 ml/min (by C-G formula based on Cr of 2.09).  Assessment: 22 yof admitted for CHF exacerbation continues on coumadin for afib with a therapeutic INR. INR trending up on 1.9 mg Average daily dose over the last 5 days.  NO bleeding reported. CBC low but stable.  11/29, INR 0.91, 5mg  11/30, INR 2.38, 2mg  12/1, INR 2.87, 2mg  12/2, INR 2.64 12/3 INR 2.69  Home dose 2.5mg  MWF and 3.75mg  TTSS  Goal of Therapy:  INR 2-3 Monitor platelets by anticoagulation protocol: Yes   Plan:  1) Coumadin 2.5mg  x 1 2) Follow up INR in AM   Thank you for allowing pharmacy to be a part of this patients care team.  Lovenia Kim Pharm.D., BCPS Clinical Pharmacist 11/23/2012 7:32 AM Pager: [phone] Phone: [phone]

## 2012-11-23 NOTE — Progress Notes (Signed)
Seen and agree with SPT note Olaoluwa Grieder [place], PT [phone]  

## 2012-11-23 NOTE — Progress Notes (Signed)
PT Cancellation Note  Patient Details Name: Oneida Mckamey MRN: [id number] DOB: [birth year]   Cancelled Treatment:    Reason Eval/Treat Not Completed: Other (comment) (pt eating breakfast and deferred therapy at this time) [name], PT [phone]

## 2012-11-23 NOTE — Progress Notes (Signed)
Provided patient with SNF bed offers- she is pleased to see that [facility] offered as she has been there before and this is close to her home/family. Uncertain when she may be ready for transfer- MD please advise- CSW assisting family with Medicaid info as well for patient- FL2 in paper chart for MD signature-  Reece Levy, MSW, Theresia Majors [phone]

## 2012-11-23 NOTE — Progress Notes (Signed)
Advanced Heart Failure Rounding Note   Subjective:    Desiree Pierce is a 78yo female with PMHx s/f chronic diastolic CHF, congenital solitary kidney, CKD (stage IV, baseline Cr 1.5-2.4), COPD + OSA + morbid obesity ->secondary pulmonary HTN, permanent atrial fibrillation, OSA, GERD who presents to [facility] ED with shortness of breath.   She last followed up in CHF clinic in 05/2012. Baseline weight 195 lbs. Denied weighing daily. Denied needing  increased Lasix. Drinking 2L fluid/day. Fluid restriction enforced. Two month follow-up recommended, but this is not documented.  10/2011 echo: LVEF 55-60%, moderate LVH, mod-severe biatrial enlargement, mild MR, mod-severe TR, normal RV size and function.   She reports experiencing progressive SOB and worsening LE edema. She has noticed a subjective weight increase. Denies chest pain, palpitations, PND, orthopnea. She does endorse new cough productive of white sputum. She is quite limited by arthritis, and is essentially wheelchair bound. She lives alone and no longer received home health services. She has not followed up in the CHF clinic since June. She admits to occasional dietary indiscretion, including Thanksgiving yesterday. No recent illness, fevers or chills. No active bleeding. Reports compliance with Lasix and Coumadin. Daughter is quite concerned about living situation.   In the ED, EKG reveals nonspecific inferior subtle TWIs. No ST changes. POC TnI 0.02. pBNP 7488.0 .CXR reveals cardiomegaly, small R pleural effusion, otherwise unremarkable. BMET reveals BUN 41, Cr 2.14. Admit weight 232 pounds.  11/19/12 Started on Lasix 80 mg IV bid. 11/21/12 received IV lasix and Metolalzone 5 mg x1. Yesterday SBP remained > 150 hydralazine 50 mg every 8 hours added.   I/O since admit - 7.2 liters. Down 12 pounds.   11/22/12 ECHO EF 60% Moderate LVH Mild Mitral regurgitation  Denies SOB/PND/Orthopnea         Objective:   Weight Range:  Vital  Signs:   Temp:  [97.6 F (36.4 C)-98.5 F (36.9 C)] 98.1 F (36.7 C) (12/03 0400) Pulse Rate:  [31-93] 31  (12/03 0500) Resp:  [15-24] 20  (12/03 0500) BP: (122-157)/(56-95) 157/95 mmHg (12/03 0500) SpO2:  [94 %-98 %] 96 % (12/03 0500) Weight:  [220 lb 3.8 oz (99.9 kg)] 220 lb 3.8 oz (99.9 kg) (12/03 0500) Last BM Date: 11/21/12  Weight change: Filed Weights   11/21/12 0500 11/22/12 0500 11/23/12 0500  Weight: 227 lb 1.2 oz (103 kg) 222 lb 3.6 oz (100.8 kg) 220 lb 3.8 oz (99.9 kg)    Intake/Output:   Intake/Output Summary (Last 24 hours) at 11/23/12 0802 Last data filed at 11/23/12 0300  Gross per 24 hour  Intake    918 ml  Output   2101 ml  Net  -1183 ml     Physical Exam: CVP  15 General:  Chronically ill appearing. No resp difficulty HEENT: normal Neck: supple. JVP to jaw. Carotids 2+ bilat; no bruits. No lymphadenopathy or thryomegaly appreciated. Cor: PMI nondisplaced. Irregular rate & rhythm. No rubs, gallops or murmurs. Lungs: clear Abdomen: soft, nontender, nondistended. No hepatosplenomegaly. No bruits or masses. Good bowel sounds. Extremities: no cyanosis, clubbing, rash, R and LLE 1+edema Ted hose in place Neuro: alert & orientedx3, cranial nerves grossly intact. moves all 4 extremities w/o difficulty. Affect pleasant  Telemetry: A fib  Labs: Basic Metabolic Panel:  Lab 11/23/12 9147 11/22/12 0500 11/21/12 0500 11/20/12 0919 11/19/12 2131 11/19/12 1655  NA 146* 145 145 146* -- 143  K 4.5 4.5 4.5 4.2 -- 4.0  CL 100 102 105 106 -- 103  CO2  36* 35* 34* 32 -- 29  GLUCOSE 96 91 95 85 -- 125*  BUN 47* 42* 38* 37* -- 41*  CREATININE 2.09* 2.00* 1.98* 1.94* -- 2.14*  CALCIUM 9.3 9.3 9.0 -- -- --  MG -- -- -- -- 2.1 --  PHOS -- -- -- -- -- --    Liver Function Tests:  Lab 11/19/12 1655  AST 29  ALT 17  ALKPHOS 94  BILITOT 0.6  PROT 7.3  ALBUMIN 3.1*   No results found for this basename: LIPASE:5,AMYLASE:5 in the last 168 hours No results found for  this basename: AMMONIA:3 in the last 168 hours  CBC:  Lab 11/23/12 0521 11/22/12 0500 11/21/12 0500 11/20/12 0919 11/19/12 2131  WBC 8.4 6.8 8.0 5.4 6.0  NEUTROABS -- -- -- -- --  HGB 10.6* 10.0* 10.6* 11.3* 10.8*  HCT 36.4 34.2* 36.3 39.7 37.5  MCV 90.3 91.4 91.2 91.3 89.7  PLT 145* 135* 159 159 159    Cardiac Enzymes:  Lab 11/20/12 0920 11/19/12 2132  CKTOTAL -- --  CKMB -- --  CKMBINDEX -- --  TROPONINI <0.30 <0.30    BNP: BNP (last 3 results)  Basename 11/19/12 1655  PROBNP 7488.0*     Other results:  EKG:   Imaging: No results found.   Medications:     Scheduled Medications:    . Chlorhexidine Gluconate Cloth  6 each Topical Q0600  . cloNIDine  0.3 mg Oral Daily  . diltiazem  360 mg Oral Daily  . furosemide  80 mg Intravenous BID  . [COMPLETED] hydrALAZINE  25 mg Oral Once  . hydrALAZINE  75 mg Oral Q8H  . metolazone  5 mg Oral Daily  . metoprolol tartrate  25 mg Oral BID  . mupirocin ointment  1 application Nasal BID  . potassium chloride  40 mEq Oral BID  . senna-docusate  1 tablet Oral Daily  . sodium chloride  3 mL Intravenous Q12H  . [EXPIRED] warfarin  2 mg Oral ONCE-1800  . [COMPLETED] warfarin  2.5 mg Oral ONCE-1800  . warfarin  2.5 mg Oral ONCE-1800  . Warfarin - Pharmacist Dosing Inpatient   Does not apply q1800  . [DISCONTINUED] hydrALAZINE  25 mg Oral Q8H  . [DISCONTINUED] hydrALAZINE  50 mg Oral Q8H  . [DISCONTINUED] metolazone  5 mg Oral Once    Infusions:    PRN Medications: sodium chloride, acetaminophen, ALPRAZolam, ondansetron (ZOFRAN) IV, sodium chloride, traMADol, zolpidem   Assessment:   1. Acute on chronic diastolic CHF  2. Permanent atrial fibrillation  3. Morbid obesity  4. Chronic renal failure, stage 4  --solitary kidney  5. OSA  6. Physical debility/deconditioning - wheelchair bound  7. Noncompliance  8. Hypertension, poorly controlled   Plan/Discussion:   Volume status improving. CVP down to 15.  Renal function remains within her baseline. SBP remains > 150.  As urine output was less vigorous yesterday, will increase Lasix to 80 mg IV q8 hrs and continue metolazone.  Increase Hydralazine to 75 mg tid. Would like SBP < 140.   Coumadin per pharmacy. INR at goal.   Appreciate SW consult. Anticipate D/C to SNF in next 24-48 hours for ongoing rehab.   Length of Stay: 4   Dalton Shirlee Latch 11/23/2012, 8:02 AM

## 2012-11-23 NOTE — Progress Notes (Signed)
Physical Therapy Treatment Patient Details Name: Desiree Pierce MRN: [id number] DOB: February 08, 1931 Today's Date: 11/23/2012 Time: 4098-1191 PT Time Calculation (min): 28 min  PT Assessment / Plan / Recommendation Comments on Treatment Session  Pt. admitted with CHF and edema in all extremities; Pt. continues to progress with ambulation each tx. However, pt. continues to have difficulty with standing. Pt. is very pleasant and willing to try all mobility asked of her despite pain in bil feet and lower legs due to arthritis. Pt. states even though it is painful for her to stand she knows she needs to be up and moving.      Follow Up Recommendations  SNF;Supervision/Assistance - 24 hour           Equipment Recommendations  None recommended by PT       Frequency Min 3X/week   Plan Discharge plan remains appropriate;Frequency remains appropriate    Precautions / Restrictions Precautions Precautions: Fall Restrictions Weight Bearing Restrictions: No   Pertinent Vitals/Pain BP 143/81 with pt. sitting in chair O2 at 95% on 1L at end of tx; dropped to 88% at lowest during ambulation HR 91 at end of tx    Mobility  Bed Mobility Bed Mobility: Not assessed Details for Bed Mobility Assistance: Pt. in chair Transfers Sit to Stand: 3: Mod assist;From chair/3-in-1;With armrests Stand to Sit: 3: Mod assist;To chair/3-in-1;With armrests Details for Transfer Assistance: Performed x2 all from chair; Pt. required cueing for hand placement and sequencing. Pt. uses rocking momentum to stand. Physical assist to control descent to chair when sitting.  Ambulation/Gait Ambulation/Gait Assistance: 4: Min assist Ambulation Distance (Feet): 23 Feet (18'; 23') Assistive device: Rolling walker Ambulation/Gait Assistance Details: Pt. required cueing to stay inside RW and to stand upright. Physical assist needed to manage RW with turns. Follow with chair.  Gait Pattern: Step-to pattern;Decreased stride  length;Trunk flexed Gait velocity: decreased Stairs: No    Exercises General Exercises - Lower Extremity Short Arc Quad: AROM;Both;20 reps;Seated Hip Flexion/Marching: AROM;Both;20 reps;Seated     PT Goals Acute Rehab PT Goals PT Goal: Supine/Side to Sit - Progress: Progressing toward goal PT Goal: Sit to Supine/Side - Progress: Progressing toward goal PT Goal: Sit to Stand - Progress: Progressing toward goal Pt will Ambulate: with rolling walker;16 - 50 feet;with min assist PT Goal: Ambulate - Progress: Updated due to goal met  Visit Information  Last PT Received On: 11/23/12 Assistance Needed: +2 (to follow with chair for amb)    Subjective Data  Subjective: "Are y'all here to see me?"   Cognition  Overall Cognitive Status: Appears within functional limits for tasks assessed/performed Arousal/Alertness: Awake/alert Orientation Level: Appears intact for tasks assessed Behavior During Session: [facility] for tasks performed       End of Session PT - End of Session Equipment Utilized During Treatment: Gait belt;Oxygen Activity Tolerance: Patient tolerated treatment well Patient left: in chair;with call bell/phone within reach Nurse Communication: Mobility status     Army Chaco SPT 11/23/2012, 12:53 PM

## 2012-11-24 LAB — CBC
HCT: 34.8 % — ABNORMAL LOW (ref 36.0–46.0)
Hemoglobin: 10.3 g/dL — ABNORMAL LOW (ref 12.0–15.0)
MCH: 26.8 pg (ref 26.0–34.0)
MCHC: 29.6 g/dL — ABNORMAL LOW (ref 30.0–36.0)
MCV: 90.4 fL (ref 78.0–100.0)
Platelets: 142 10*3/uL — ABNORMAL LOW (ref 150–400)
RBC: 3.85 MIL/uL — ABNORMAL LOW (ref 3.87–5.11)
RDW: 17.4 % — ABNORMAL HIGH (ref 11.5–15.5)
WBC: 7.1 10*3/uL (ref 4.0–10.5)

## 2012-11-24 LAB — BASIC METABOLIC PANEL
CO2: 37 mEq/L — ABNORMAL HIGH (ref 19–32)
Calcium: 9.1 mg/dL (ref 8.4–10.5)
Glucose, Bld: 95 mg/dL (ref 70–99)
Potassium: 4.2 mEq/L (ref 3.5–5.1)
Sodium: 140 mEq/L (ref 135–145)

## 2012-11-24 LAB — PROTIME-INR
INR: 2.32 — ABNORMAL HIGH (ref 0.00–1.49)
Prothrombin Time: 24.4 seconds — ABNORMAL HIGH (ref 11.6–15.2)

## 2012-11-24 MED ORDER — WARFARIN SODIUM 2.5 MG PO TABS
2.5000 mg | ORAL_TABLET | Freq: Once | ORAL | Status: AC
  Administered 2012-11-24: 2.5 mg via ORAL
  Filled 2012-11-24: qty 1

## 2012-11-24 NOTE — Progress Notes (Signed)
Physical Therapy Treatment Patient Details Name: Charlesia Canaday MRN: [id number] DOB: 03-Nov-1931 Today's Date: 11/24/2012 Time: 4098-1191 PT Time Calculation (min): 24 min  PT Assessment / Plan / Recommendation Comments on Treatment Session  pt presents with CHF and edema in extremities.  pt notes distance she ambulated today is further than she normally does at home.  pt states she only walks from her lift chair to the bathroom and that's it.  Encouraged pt to perform LE ther ex again during day.      Follow Up Recommendations  SNF;Supervision/Assistance - 24 hour     Does the patient have the potential to tolerate intense rehabilitation     Barriers to Discharge        Equipment Recommendations  None recommended by PT    Recommendations for Other Services    Frequency Min 3X/week   Plan Discharge plan remains appropriate;Frequency remains appropriate    Precautions / Restrictions Precautions Precautions: Fall Restrictions Weight Bearing Restrictions: No   Pertinent Vitals/Pain Indicates chronic arthritis pain in Bil hands and knees.      Mobility  Bed Mobility Bed Mobility: Not assessed Transfers Transfers: Sit to Stand;Stand to Sit Sit to Stand: 3: Mod assist;From bed;With upper extremity assist Stand to Sit: 3: Mod assist;To chair/3-in-1;With armrests Details for Transfer Assistance: cues for UE use and controlling descent to chair.   Ambulation/Gait Ambulation/Gait Assistance: 4: Min assist Ambulation Distance (Feet): 30 Feet Assistive device: Rolling walker Ambulation/Gait Assistance Details: Cues for upright posture and staying close to RW.  Unable to get good read on pulse ox during mobility as pt gripping with hand.  After ambulating pt's O2 sats were 93-94% on RA.   Gait Pattern: Step-to pattern;Decreased stride length;Trunk flexed Stairs: No Wheelchair Mobility Wheelchair Mobility: No    Exercises General Exercises - Lower Extremity Ankle Circles/Pumps:  AROM;Both;10 reps Long Arc Quad: AROM;Both;10 reps Hip Flexion/Marching: AROM;Both;10 reps   PT Diagnosis:    PT Problem List:   PT Treatment Interventions:     PT Goals Acute Rehab PT Goals Time For Goal Achievement: 11/28/12 PT Goal: Sit to Stand - Progress: Progressing toward goal Pt will Ambulate: 16 - 50 feet;with modified independence;with rolling walker PT Goal: Ambulate - Progress: Updated due to goal met  Visit Information  Last PT Received On: 11/24/12 Assistance Needed: +2 (helpful for chair follow)    Subjective Data  Subjective: I don't know how I'm doing!     Cognition  Overall Cognitive Status: Appears within functional limits for tasks assessed/performed Arousal/Alertness: Awake/alert Orientation Level: Appears intact for tasks assessed Behavior During Session: [facility] for tasks performed    Balance  Balance Balance Assessed: No  End of Session PT - End of Session Equipment Utilized During Treatment: Gait belt Activity Tolerance: Patient tolerated treatment well Patient left: in chair;with call bell/phone within reach Nurse Communication: Mobility status   GP     Sunny Schlein, [place] [phone] 11/24/2012, 9:36 AM

## 2012-11-24 NOTE — Progress Notes (Signed)
Advised [facility] that patient is likely ready in next 48 hours per MD note for transfer to them- this is confirmed and I will update patient and daughter of this.  Reece Levy, MSW, Theresia Majors [phone]

## 2012-11-24 NOTE — Progress Notes (Signed)
ANTICOAGULATION CONSULT NOTE - Follow Up Consult  Pharmacy Consult for Coumadin Indication: atrial fibrillation  No Known Allergies  Patient Measurements: Height: 5\' 2"  (157.5 cm) Weight: 222 lb 0.1 oz (100.7 kg) IBW/kg (Calculated) : 50.1   Vital Signs: BP: 126/71 mmHg (12/04 0758) Pulse Rate: 84  (12/04 0758)  Labs:  Basename 11/24/12 0450 11/23/12 0521 11/22/12 0500  HGB 10.3* 10.6* --  HCT 34.8* 36.4 34.2*  PLT 142* 145* 135*  APTT -- -- --  LABPROT 24.4* 27.3* 26.9*  INR 2.32* 2.69* 2.64*  HEPARINUNFRC -- -- --  CREATININE 2.03* 2.09* 2.00*  CKTOTAL -- -- --  CKMB -- -- --  TROPONINI -- -- --    Estimated Creatinine Clearance: 24.9 ml/min (by C-G formula based on Cr of 2.03).  Assessment: 55 yof admitted for CHF exacerbation continues on coumadin for afib with a therapeutic INR. INR down to 2.32.  NO bleeding reported. CBC low but stable.  11/29, INR 0.91, 5mg  11/30, INR 2.38, 2mg  12/1, INR 2.87, 2mg  12/2, INR 2.64 12/3 INR 2.69 12/4 INR 2.32  Home dose 2.5mg  MWF and 3.75mg  TTSS  Goal of Therapy:  INR 2-3 Monitor platelets by anticoagulation protocol: Yes   Plan:  1) Coumadin 2.5mg  x 1 2) Follow up INR in AM   Thank you for allowing pharmacy to be a part of this patients care team.  Celedonio Miyamoto, PharmD, BCPS Clinical Pharmacist Pager [phone]

## 2012-11-24 NOTE — Progress Notes (Signed)
Notified MD that patient had SVT with heart rate 140's last night, was asymptomatic.  Rhythm is otherwise atrial fibrillation with rate in 80's.  BP at the time was 135/82, saturation 98% on 2 Liters [place]

## 2012-11-24 NOTE — Progress Notes (Signed)
Advanced Heart Failure Rounding Note   Subjective:    Desiree Pierce is a 78yo female with PMHx s/f chronic diastolic CHF, congenital solitary kidney, CKD (stage IV, baseline Cr 1.5-2.4), COPD + OSA + morbid obesity ->secondary pulmonary HTN, permanent atrial fibrillation, OSA, GERD who presents to [facility] ED with shortness of breath.   She last followed up in CHF clinic in 05/2012. Baseline weight 195 lbs. Denied weighing daily. Denied needing  increased Lasix. Drinking 2L fluid/day. Fluid restriction enforced. Two month follow-up recommended, but this is not documented.  10/2011 echo: LVEF 55-60%, moderate LVH, mod-severe biatrial enlargement, mild MR, mod-severe TR, normal RV size and function.   She reports experiencing progressive SOB and worsening LE edema. She has noticed a subjective weight increase. Denies chest pain, palpitations, PND, orthopnea. She does endorse new cough productive of white sputum. She is quite limited by arthritis, and is essentially wheelchair bound. She lives alone and no longer received home health services. She has not followed up in the CHF clinic since June. She admits to occasional dietary indiscretion, including Thanksgiving yesterday. No recent illness, fevers or chills. No active bleeding. Reports compliance with Lasix and Coumadin. Daughter is quite concerned about living situation.   In the ED, EKG reveals nonspecific inferior subtle TWIs. No ST changes. POC TnI 0.02. pBNP 7488.0 .CXR reveals cardiomegaly, small R pleural effusion, otherwise unremarkable. BMET reveals BUN 41, Cr 2.14. Admit weight 232 pounds.  11/19/12 Started on Lasix 80 mg IV bid. 11/21/12 received IV lasix and Metolalzone 5 mg x1. Yesterday SBP remained > 150 hydralazine increased to 75 mg tid and lasix increased to 80 mg tid.   Last night short run SVT while sleeping. I/O since admit - 10.1 liters.   11/22/12 ECHO EF 60% Moderate LVH Mild Mitral regurgitation  Denies SOB/PND/Orthopnea          Objective:   Weight Range:  Vital Signs:   Temp:  [98.1 F (36.7 C)-98.4 F (36.9 C)] 98.4 F (36.9 C) (12/03 2004) Pulse Rate:  [75-94] 84  (12/04 0758) Resp:  [15-22] 17  (12/04 0758) BP: (112-153)/(56-84) 126/71 mmHg (12/04 0758) SpO2:  [88 %-99 %] 98 % (12/04 0758) Weight:  [222 lb 0.1 oz (100.7 kg)] 222 lb 0.1 oz (100.7 kg) (12/04 0053) Last BM Date: 11/23/12  Weight change: Filed Weights   11/22/12 0500 11/23/12 0500 11/24/12 0053  Weight: 222 lb 3.6 oz (100.8 kg) 220 lb 3.8 oz (99.9 kg) 222 lb 0.1 oz (100.7 kg)    Intake/Output:   Intake/Output Summary (Last 24 hours) at 11/24/12 0823 Last data filed at 11/24/12 0802  Gross per 24 hour  Intake   1260 ml  Output   5000 ml  Net  -3740 ml     Physical Exam: CVP  15 General:  Chronically ill appearing. No resp difficulty HEENT: normal Neck: supple. JVP to jaw. Carotids 2+ bilat; no bruits. No lymphadenopathy or thryomegaly appreciated. Cor: PMI nondisplaced. Irregular rate & rhythm. No rubs, gallops or murmurs. Lungs: clear Abdomen: soft, nontender, nondistended. No hepatosplenomegaly. No bruits or masses. Good bowel sounds. Extremities: no cyanosis, clubbing, rash, R and LLE 1+edema  Neuro: alert & orientedx3, cranial nerves grossly intact. moves all 4 extremities w/o difficulty. Affect pleasant  Telemetry: A fib  Labs: Basic Metabolic Panel:  Lab 11/24/12 1610 11/23/12 0521 11/22/12 0500 11/21/12 0500 11/20/12 0919 11/19/12 2131  NA 140 146* 145 145 146* --  K 4.2 4.5 4.5 4.5 4.2 --  CL 96 100  102 105 106 --  CO2 37* 36* 35* 34* 32 --  GLUCOSE 95 96 91 95 85 --  BUN 51* 47* 42* 38* 37* --  CREATININE 2.03* 2.09* 2.00* 1.98* 1.94* --  CALCIUM 9.1 9.3 9.3 -- -- --  MG -- -- -- -- -- 2.1  PHOS -- -- -- -- -- --    Liver Function Tests:  Lab 11/19/12 1655  AST 29  ALT 17  ALKPHOS 94  BILITOT 0.6  PROT 7.3  ALBUMIN 3.1*   No results found for this basename: LIPASE:5,AMYLASE:5 in the  last 168 hours No results found for this basename: AMMONIA:3 in the last 168 hours  CBC:  Lab 11/24/12 0450 11/23/12 0521 11/22/12 0500 11/21/12 0500 11/20/12 0919  WBC 7.1 8.4 6.8 8.0 5.4  NEUTROABS -- -- -- -- --  HGB 10.3* 10.6* 10.0* 10.6* 11.3*  HCT 34.8* 36.4 34.2* 36.3 39.7  MCV 90.4 90.3 91.4 91.2 91.3  PLT 142* 145* 135* 159 159    Cardiac Enzymes:  Lab 11/20/12 0920 11/19/12 2132  CKTOTAL -- --  CKMB -- --  CKMBINDEX -- --  TROPONINI <0.30 <0.30    BNP: BNP (last 3 results)  Basename 11/19/12 1655  PROBNP 7488.0*     Other results:  EKG:   Imaging: No results found.   Medications:     Scheduled Medications:    . [COMPLETED] Chlorhexidine Gluconate Cloth  6 each Topical Q0600  . cloNIDine  0.3 mg Oral Daily  . diltiazem  360 mg Oral Daily  . furosemide  80 mg Intravenous TID  . hydrALAZINE  75 mg Oral Q8H  . metolazone  5 mg Oral Daily  . metoprolol tartrate  25 mg Oral BID  . mupirocin ointment  1 application Nasal BID  . potassium chloride  40 mEq Oral BID  . senna-docusate  1 tablet Oral Daily  . sodium chloride  3 mL Intravenous Q12H  . [COMPLETED] warfarin  2.5 mg Oral ONCE-1800  . Warfarin - Pharmacist Dosing Inpatient   Does not apply q1800    Infusions:    PRN Medications: sodium chloride, acetaminophen, ALPRAZolam, ondansetron (ZOFRAN) IV, sodium chloride, traMADol, zolpidem   Assessment:   1. Acute on chronic diastolic CHF  2. Permanent atrial fibrillation  3. Morbid obesity  4. Chronic renal failure, stage 4  --solitary kidney  5. OSA  6. Physical debility/deconditioning - wheelchair bound  7. Noncompliance  8. Hypertension, poorly controlled   Plan/Discussion:   Volume status improving but CVP still 19. Weight up 2 pounds but she is - 2.9  liters for 24 hours. Renal function remains within her baseline. Continue current diuretic regimen.  I suspect that the weights here are not accurate (difficult to weigh her).   She seems to be diuresing well but still is volume overloaded.   SBP better controlled. Continue current hydralazine regimen.      Coumadin per pharmacy. INR at goal.   Appreciate SW consult. Anticipate D/C to SNF in next  48 hours for ongoing rehab.   Length of Stay: 5  Desiree Pierce 11/24/2012 8:24 AM

## 2012-11-25 LAB — BASIC METABOLIC PANEL
Chloride: 98 mEq/L (ref 96–112)
GFR calc Af Amer: 26 mL/min — ABNORMAL LOW (ref >90)
GFR calc non Af Amer: 23 mL/min — ABNORMAL LOW (ref >90)
Glucose, Bld: 81 mg/dL (ref 70–99)
Potassium: 4.1 mEq/L (ref 3.5–5.1)
Sodium: 145 mEq/L (ref 135–145)

## 2012-11-25 LAB — PROTIME-INR
INR: 2.14 — ABNORMAL HIGH (ref 0.00–1.49)
Prothrombin Time: 23 seconds — ABNORMAL HIGH (ref 11.6–15.2)

## 2012-11-25 LAB — CBC
Hemoglobin: 10.2 g/dL — ABNORMAL LOW (ref 12.0–15.0)
MCHC: 29.1 g/dL — ABNORMAL LOW (ref 30.0–36.0)
RDW: 17.3 % — ABNORMAL HIGH (ref 11.5–15.5)
WBC: 6.4 10*3/uL (ref 4.0–10.5)

## 2012-11-25 MED ORDER — WARFARIN SODIUM 4 MG PO TABS
4.0000 mg | ORAL_TABLET | Freq: Once | ORAL | Status: AC
  Administered 2012-11-25: 4 mg via ORAL
  Filled 2012-11-25: qty 1

## 2012-11-25 NOTE — Progress Notes (Signed)
ANTICOAGULATION CONSULT NOTE - Follow Up Consult  Pharmacy Consult for Coumadin Indication: atrial fibrillation  No Known Allergies  Patient Measurements: Height: 5\' 2"  (157.5 cm) Weight: 217 lb 6 oz (98.6 kg) IBW/kg (Calculated) : 50.1   Vital Signs: Temp: 97.3 F (36.3 C) (12/05 0800) Temp src: Axillary (12/05 0800) BP: 119/74 mmHg (12/05 0800) Pulse Rate: 84  (12/05 0800)  Labs:  Basename 11/25/12 0559 11/24/12 0450 11/23/12 0521  HGB 10.2* 10.3* --  HCT 35.0* 34.8* 36.4  PLT 161 142* 145*  APTT -- -- --  LABPROT 23.0* 24.4* 27.3*  INR 2.14* 2.32* 2.69*  HEPARINUNFRC -- -- --  CREATININE 2.00* 2.03* 2.09*  CKTOTAL -- -- --  CKMB -- -- --  TROPONINI -- -- --    Estimated Creatinine Clearance: 25 ml/min (by C-G formula based on Cr of 2).  Assessment: 35 yof admitted for CHF exacerbation continues on coumadin for afib with a therapeutic INR. INR down to 2.32.  NO bleeding reported. CBC low but stable.  11/29, INR 0.91, 5mg  11/30, INR 2.38, 2mg  12/1, INR 2.87, 2mg  12/2, INR 2.64 12/3 INR 2.69 12/4 INR 2.32 12/5 INR 2/14 Home dose 2.5mg  MWF and 3.75mg  TTSS  Goal of Therapy:  INR 2-3 Monitor platelets by anticoagulation protocol: Yes   Plan:  1) Coumadin 4mg  x 1 2) Follow up INR in AM   Thank you for allowing pharmacy to be a part of this patients care team.  Celedonio Miyamoto, PharmD, BCPS Clinical Pharmacist Pager [phone]

## 2012-11-25 NOTE — Progress Notes (Signed)
Advanced Heart Failure Rounding Note   Subjective:    Desiree Pierce is a 78yo female with PMHx s/f chronic diastolic CHF, congenital solitary kidney, CKD (stage IV, baseline Cr 1.5-2.4), COPD + OSA + morbid obesity ->secondary pulmonary HTN, permanent atrial fibrillation, OSA, GERD who presents to [facility] ED with shortness of breath.   She last followed up in CHF clinic in 05/2012. Baseline weight 195 lbs. Denied weighing daily. Denied needing  increased Lasix. Drinking 2L fluid/day. Fluid restriction enforced. Two month follow-up recommended, but this is not documented.  10/2011 echo: LVEF 55-60%, moderate LVH, mod-severe biatrial enlargement, mild MR, mod-severe TR, normal RV size and function.   She reports experiencing progressive SOB and worsening LE edema. She has noticed a subjective weight increase. Denies chest pain, palpitations, PND, orthopnea. She does endorse new cough productive of white sputum. She is quite limited by arthritis, and is essentially wheelchair bound. She lives alone and no longer received home health services. She has not followed up in the CHF clinic since June. She admits to occasional dietary indiscretion, including Thanksgiving yesterday. No recent illness, fevers or chills. No active bleeding. Reports compliance with Lasix and Coumadin. Daughter is quite concerned about living situation.   In the ED, EKG reveals nonspecific inferior subtle TWIs. No ST changes. POC TnI 0.02. pBNP 7488.0 .CXR reveals cardiomegaly, small R pleural effusion, otherwise unremarkable. BMET reveals BUN 41, Cr 2.14. Admit weight 232 pounds.  11/19/12 Started on Lasix 80 mg IV bid. 11/21/12 Continue on Lasix 80 mg IV tid and  Metolalzone 5 mg daily. Hydralazine has been titrated up to 75 mg tid. Overall weight down 15 pounds. I/O since admit - 12.6 liters.   11/22/12 ECHO EF 60% Moderate LVH Mild Mitral regurgitation  Feels much better. Denies SOB/PND/Orthopnea         Objective:   Weight  Range:  Vital Signs:   Temp:  [97.1 F (36.2 C)-98.3 F (36.8 C)] 97.1 F (36.2 C) (12/05 1200) Pulse Rate:  [34-87] 80  (12/05 1200) Resp:  [12-23] 17  (12/05 1200) BP: (99-147)/(62-95) 112/95 mmHg (12/05 1200) SpO2:  [87 %-100 %] 92 % (12/05 1200) Weight:  [98.6 kg (217 lb 6 oz)-100.7 kg (222 lb 0.1 oz)] 98.6 kg (217 lb 6 oz) (12/05 0500) Last BM Date: 11/24/12  Weight change: Filed Weights   11/24/12 0053 11/25/12 0345 11/25/12 0500  Weight: 100.7 kg (222 lb 0.1 oz) 100.7 kg (222 lb 0.1 oz) 98.6 kg (217 lb 6 oz)    Intake/Output:   Intake/Output Summary (Last 24 hours) at 11/25/12 1301 Last data filed at 11/25/12 1200  Gross per 24 hour  Intake   1076 ml  Output   2550 ml  Net  -1474 ml     Physical Exam: CVP  19 General:  Chronically ill appearing. No resp difficulty HEENT: normal Neck: supple. JVP to jaw. Carotids 2+ bilat; no bruits. No lymphadenopathy or thryomegaly appreciated. Cor: PMI nondisplaced. Irregular rate & rhythm. No rubs, gallops or murmurs. Lungs: clear Abdomen: soft, nontender, nondistended. No hepatosplenomegaly. No bruits or masses. Good bowel sounds. Extremities: no cyanosis, clubbing, rash, R and LLE 1+edema  Neuro: alert & orientedx3, cranial nerves grossly intact. moves all 4 extremities w/o difficulty. Affect pleasant  Telemetry: A fib 70-80s  Labs: Basic Metabolic Panel:  Lab 11/25/12 1610 11/24/12 0450 11/23/12 0521 11/22/12 0500 11/21/12 0500 11/19/12 2131  NA 145 140 146* 145 145 --  K 4.1 4.2 4.5 4.5 4.5 --  CL 98  96 100 102 105 --  CO2 39* 37* 36* 35* 34* --  GLUCOSE 81 95 96 91 95 --  BUN 49* 51* 47* 42* 38* --  CREATININE 2.00* 2.03* 2.09* 2.00* 1.98* --  CALCIUM 9.5 9.1 9.3 -- -- --  MG -- -- -- -- -- 2.1  PHOS -- -- -- -- -- --    Liver Function Tests:  Lab 11/19/12 1655  AST 29  ALT 17  ALKPHOS 94  BILITOT 0.6  PROT 7.3  ALBUMIN 3.1*   No results found for this basename: LIPASE:5,AMYLASE:5 in the last 168  hours No results found for this basename: AMMONIA:3 in the last 168 hours  CBC:  Lab 11/25/12 0559 11/24/12 0450 11/23/12 0521 11/22/12 0500 11/21/12 0500  WBC 6.4 7.1 8.4 6.8 8.0  NEUTROABS -- -- -- -- --  HGB 10.2* 10.3* 10.6* 10.0* 10.6*  HCT 35.0* 34.8* 36.4 34.2* 36.3  MCV 89.5 90.4 90.3 91.4 91.2  PLT 161 142* 145* 135* 159    Cardiac Enzymes:  Lab 11/20/12 0920 11/19/12 2132  CKTOTAL -- --  CKMB -- --  CKMBINDEX -- --  TROPONINI <0.30 <0.30    BNP: BNP (last 3 results)  Basename 11/19/12 1655  PROBNP 7488.0*     Other results:  EKG:   Imaging: No results found.   Medications:     Scheduled Medications:    . cloNIDine  0.3 mg Oral Daily  . diltiazem  360 mg Oral Daily  . furosemide  80 mg Intravenous TID  . hydrALAZINE  75 mg Oral Q8H  . metolazone  5 mg Oral Daily  . metoprolol tartrate  25 mg Oral BID  . [COMPLETED] mupirocin ointment  1 application Nasal BID  . potassium chloride  40 mEq Oral BID  . senna-docusate  1 tablet Oral Daily  . sodium chloride  3 mL Intravenous Q12H  . [COMPLETED] warfarin  2.5 mg Oral ONCE-1800  . warfarin  4 mg Oral ONCE-1800  . Warfarin - Pharmacist Dosing Inpatient   Does not apply q1800    Infusions:    PRN Medications: sodium chloride, acetaminophen, ALPRAZolam, ondansetron (ZOFRAN) IV, sodium chloride, traMADol, zolpidem   Assessment:   1. Acute on chronic diastolic CHF  2. Permanent atrial fibrillation  3. Morbid obesity  4. Chronic renal failure, stage 4  --solitary kidney  5. OSA  6. Physical debility/deconditioning - wheelchair bound  7. Noncompliance  8. Hypertension, poorly controlled   Plan/Discussion:   Volume status improving but CVP still 19. Overall weight down 15 pounds.  Diuresed 2.6 liters over the last 24 hours. Renal function remains within her baseline. Continue current diuretic regimen.  Would like to get her as dry as possible.   SBP better controlled. Continue current  hydralazine regimen.      Coumadin per pharmacy. INR at goal.   Appreciate SW consult. Anticipate D/C to SNF early next week when fully diuresed. Plan to d/c to [facility].   Length of Stay: 6  Desiree Pierce 11/25/2012 1:01 PM

## 2012-11-25 NOTE — Progress Notes (Signed)
Spoke with [employer] Rep who states they are prepared to provide clinical management of CHF in this patient and their Physician- Dr. Baltazar Najjar will be following the patient to oversee all her skilled needs. Patient and family agreeable to plans for d/c- ?tomorrow. Reece Levy, MSW, Theresia Majors [phone]

## 2012-11-26 LAB — PROTIME-INR: INR: 2.33 — ABNORMAL HIGH (ref 0.00–1.49)

## 2012-11-26 LAB — CBC
Platelets: 166 10*3/uL (ref 150–400)
RBC: 3.89 MIL/uL (ref 3.87–5.11)
RDW: 17.1 % — ABNORMAL HIGH (ref 11.5–15.5)
WBC: 5.4 10*3/uL (ref 4.0–10.5)

## 2012-11-26 LAB — BASIC METABOLIC PANEL
Chloride: 99 mEq/L (ref 96–112)
GFR calc Af Amer: 26 mL/min — ABNORMAL LOW (ref >90)
Potassium: 4.2 mEq/L (ref 3.5–5.1)

## 2012-11-26 MED ORDER — WARFARIN SODIUM 3 MG PO TABS
3.0000 mg | ORAL_TABLET | Freq: Every day | ORAL | Status: DC
  Administered 2012-11-26 – 2012-12-01 (×6): 3 mg via ORAL
  Filled 2012-11-26 (×7): qty 1

## 2012-11-26 MED ORDER — CLONIDINE HCL 0.2 MG PO TABS
0.2000 mg | ORAL_TABLET | Freq: Two times a day (BID) | ORAL | Status: DC
  Administered 2012-11-26 – 2012-11-30 (×9): 0.2 mg via ORAL
  Filled 2012-11-26 (×11): qty 1

## 2012-11-26 NOTE — Progress Notes (Signed)
Advanced Heart Failure Rounding Note   Subjective:    Desiree Pierce is a 78yo female with PMHx s/f chronic diastolic CHF, congenital solitary kidney, CKD (stage IV, baseline Cr 1.5-2.4), COPD + OSA + morbid obesity ->secondary pulmonary HTN, permanent atrial fibrillation, OSA, GERD who presents to [facility] ED with shortness of breath.   She last followed up in CHF clinic in 05/2012. Baseline weight 195 lbs. Denied weighing daily. Denied needing  increased Lasix. Drinking 2L fluid/day. Fluid restriction enforced. Two month follow-up recommended, but this is not documented.  10/2011 echo: LVEF 55-60%, moderate LVH, mod-severe biatrial enlargement, mild MR, mod-severe TR, normal RV size and function.   She reports experiencing progressive SOB and worsening LE edema. She has noticed a subjective weight increase. Denies chest pain, palpitations, PND, orthopnea. She does endorse new cough productive of white sputum. She is quite limited by arthritis, and is essentially wheelchair bound. She lives alone and no longer received home health services. She has not followed up in the CHF clinic since June. She admits to occasional dietary indiscretion, including Thanksgiving yesterday. No recent illness, fevers or chills. No active bleeding. Reports compliance with Lasix and Coumadin. Daughter is quite concerned about living situation.   In the ED, EKG reveals nonspecific inferior subtle TWIs. No ST changes. POC TnI 0.02. pBNP 7488.0 .CXR reveals cardiomegaly, small R pleural effusion, otherwise unremarkable. BMET reveals BUN 41, Cr 2.14. Admit weight 232 pounds.  11/19/12 Started on Lasix 80 mg IV bid. 11/21/12 Continue on Lasix 80 mg IV tid and  Metolalzone 5 mg daily. Hydralazine has been titrated up to 75 mg tid. Overall weight down  20 pounds. I/O since admit - 15.3 liters.   11/22/12 ECHO EF 60% Moderate LVH Mild Mitral regurgitation  Resting comfortably. Denies SOB/PND/Orthopnea         Objective:    Weight Range:  Vital Signs:   Temp:  [97 F (36.1 C)-97.9 F (36.6 C)] 97.8 F (36.6 C) (12/06 1140) Pulse Rate:  [27-88] 84  (12/06 1140) Resp:  [10-19] 17  (12/06 1140) BP: (112-150)/(67-97) 142/80 mmHg (12/06 1140) SpO2:  [91 %-100 %] 100 % (12/06 1140) Weight:  [96.3 kg (212 lb 4.9 oz)] 96.3 kg (212 lb 4.9 oz) (12/06 0002) Last BM Date: 11/24/12  Weight change: Filed Weights   11/25/12 0345 11/25/12 0500 11/26/12 0002  Weight: 100.7 kg (222 lb 0.1 oz) 98.6 kg (217 lb 6 oz) 96.3 kg (212 lb 4.9 oz)    Intake/Output:   Intake/Output Summary (Last 24 hours) at 11/26/12 1153 Last data filed at 11/26/12 0617  Gross per 24 hour  Intake    608 ml  Output   3050 ml  Net  -2442 ml     Physical Exam: CVP  19 General:  Chronically ill appearing. No resp difficulty HEENT: normal Neck: supple. JVP to jaw. Carotids 2+ bilat; no bruits. No lymphadenopathy or thryomegaly appreciated. Cor: PMI nondisplaced. Irregular rate & rhythm. No rubs, gallops or murmurs. Lungs: clear Abdomen: soft, nontender, nondistended. No hepatosplenomegaly. No bruits or masses. Good bowel sounds. Extremities: no cyanosis, clubbing, rash, R and LLE 1+edema  Neuro: alert & orientedx3, cranial nerves grossly intact. moves all 4 extremities w/o difficulty. Affect pleasant  Telemetry: A fib 70-80s  Labs: Basic Metabolic Panel:  Lab 11/26/12 1610 11/25/12 0559 11/24/12 0450 11/23/12 0521 11/22/12 0500 11/19/12 2131  NA 146* 145 140 146* 145 --  K 4.2 4.1 4.2 4.5 4.5 --  CL 99 98 96 100 102 --  CO2 39* 39* 37* 36* 35* --  GLUCOSE 82 81 95 96 91 --  BUN 50* 49* 51* 47* 42* --  CREATININE 2.02* 2.00* 2.03* 2.09* 2.00* --  CALCIUM 9.2 9.5 9.1 -- -- --  MG -- -- -- -- -- 2.1  PHOS -- -- -- -- -- --    Liver Function Tests:  Lab 11/19/12 1655  AST 29  ALT 17  ALKPHOS 94  BILITOT 0.6  PROT 7.3  ALBUMIN 3.1*   No results found for this basename: LIPASE:5,AMYLASE:5 in the last 168 hours No results  found for this basename: AMMONIA:3 in the last 168 hours  CBC:  Lab 11/26/12 0600 11/25/12 0559 11/24/12 0450 11/23/12 0521 11/22/12 0500  WBC 5.4 6.4 7.1 8.4 6.8  NEUTROABS -- -- -- -- --  HGB 10.0* 10.2* 10.3* 10.6* 10.0*  HCT 34.8* 35.0* 34.8* 36.4 34.2*  MCV 89.5 89.5 90.4 90.3 91.4  PLT 166 161 142* 145* 135*    Cardiac Enzymes:  Lab 11/20/12 0920 11/19/12 2132  CKTOTAL -- --  CKMB -- --  CKMBINDEX -- --  TROPONINI <0.30 <0.30    BNP: BNP (last 3 results)  Basename 11/19/12 1655  PROBNP 7488.0*     Other results:    Imaging: No results found.   Medications:     Scheduled Medications:    . cloNIDine  0.2 mg Oral BID  . diltiazem  360 mg Oral Daily  . furosemide  80 mg Intravenous TID  . hydrALAZINE  75 mg Oral Q8H  . metolazone  5 mg Oral Daily  . metoprolol tartrate  25 mg Oral BID  . potassium chloride  40 mEq Oral BID  . senna-docusate  1 tablet Oral Daily  . sodium chloride  3 mL Intravenous Q12H  . warfarin  3 mg Oral q1800  . [COMPLETED] warfarin  4 mg Oral ONCE-1800  . Warfarin - Pharmacist Dosing Inpatient   Does not apply q1800  . [DISCONTINUED] cloNIDine  0.3 mg Oral Daily    Infusions:    PRN Medications: sodium chloride, acetaminophen, ALPRAZolam, ondansetron (ZOFRAN) IV, sodium chloride, traMADol, zolpidem   Assessment:   1. Acute on chronic diastolic CHF  2. Permanent atrial fibrillation  3. Morbid obesity  4. Chronic renal failure, stage 4  --solitary kidney  5. OSA  6. Physical debility/deconditioning - wheelchair bound  7. Noncompliance  8. Hypertension, poorly controlled   Plan/Discussion:   Continues to diurese well. Overall weight down 20 pounds but CVP still 19.  Diuresed 2.7 liters over the last 24 hours. Renal function stable. Continue current diuretic regimen.  Would like to get her as dry as possible.   SBP better controlled. Continue current hydralazine regimen.   Will change Clonidine to 0.2 mg bid for  better control.    Coumadin per pharmacy. INR at goal.   Appreciate SW consult. Anticipate D/C to SNF early next week when fully diuresed. Plan to d/c to [facility].   Length of Stay: 7 Daniel Bensimhon,MD 11:57 AM

## 2012-11-26 NOTE — Progress Notes (Signed)
ANTICOAGULATION CONSULT NOTE - Follow Up Consult  Pharmacy Consult for Coumadin Indication: atrial fibrillation  No Known Allergies  Patient Measurements: Height: 5\' 2"  (157.5 cm) Weight: 212 lb 4.9 oz (96.3 kg) IBW/kg (Calculated) : 50.1   Vital Signs: Temp: 97.5 F (36.4 C) (12/06 0730) Temp src: Oral (12/06 0730) BP: 122/74 mmHg (12/06 0730) Pulse Rate: 86  (12/06 0730)  Labs:  Basename 11/26/12 0600 11/25/12 0559 11/24/12 0450  HGB 10.0* 10.2* --  HCT 34.8* 35.0* 34.8*  PLT 166 161 142*  APTT -- -- --  LABPROT 24.5* 23.0* 24.4*  INR 2.33* 2.14* 2.32*  HEPARINUNFRC -- -- --  CREATININE 2.02* 2.00* 2.03*  CKTOTAL -- -- --  CKMB -- -- --  TROPONINI -- -- --    Estimated Creatinine Clearance: 24.5 ml/min (by C-G formula based on Cr of 2.02).  Assessment: 54 yof admitted for CHF exacerbation continues on coumadin for afib. INR therapeutic. Will try a daily dose today.  Home dose 2.5mg  MWF and 3.75mg  TTSS  Goal of Therapy:  INR 2-3 Monitor platelets by anticoagulation protocol: Yes   Plan:  1) Coumadin 3 mg PO qday

## 2012-11-27 LAB — PROTIME-INR
INR: 2.34 — ABNORMAL HIGH (ref 0.00–1.49)
Prothrombin Time: 24.6 seconds — ABNORMAL HIGH (ref 11.6–15.2)

## 2012-11-27 LAB — BASIC METABOLIC PANEL
CO2: 39 mEq/L — ABNORMAL HIGH (ref 19–32)
Calcium: 9.2 mg/dL (ref 8.4–10.5)
Chloride: 98 mEq/L (ref 96–112)
Potassium: 4.3 mEq/L (ref 3.5–5.1)
Sodium: 146 mEq/L — ABNORMAL HIGH (ref 135–145)

## 2012-11-27 LAB — CBC
MCH: 26.2 pg (ref 26.0–34.0)
Platelets: 165 10*3/uL (ref 150–400)
RBC: 3.86 MIL/uL — ABNORMAL LOW (ref 3.87–5.11)
WBC: 5.8 10*3/uL (ref 4.0–10.5)

## 2012-11-27 NOTE — Progress Notes (Signed)
Patient ID: Desiree Pierce, female   DOB: [birth year], 78 y.o.   MRN: [id number] Advanced Heart Failure Rounding Note   Subjective:    Ms. Isaac is a 78yo female with PMHx s/f chronic diastolic CHF, congenital solitary kidney, CKD (stage IV, baseline Cr 1.5-2.4), COPD + OSA + morbid obesity ->secondary pulmonary HTN, permanent atrial fibrillation, OSA, GERD who presents to [facility] ED with shortness of breath.   She last followed up in CHF clinic in 05/2012. Baseline weight 195 lbs. Denied weighing daily. Denied needing  increased Lasix. Drinking 2L fluid/day. Fluid restriction enforced. Two month follow-up recommended, but this is not documented.  10/2011 echo: LVEF 55-60%, moderate LVH, mod-severe biatrial enlargement, mild MR, mod-severe TR, normal RV size and function.   She reports experiencing progressive SOB and worsening LE edema. She has noticed a subjective weight increase. Denies chest pain, palpitations, PND, orthopnea. She does endorse new cough productive of white sputum. She is quite limited by arthritis, and is essentially wheelchair bound. She lives alone and no longer received home health services. She has not followed up in the CHF clinic since June. She admits to occasional dietary indiscretion, including Thanksgiving yesterday. No recent illness, fevers or chills. No active bleeding. Reports compliance with Lasix and Coumadin. Daughter is quite concerned about living situation.   In the ED, EKG reveals nonspecific inferior subtle TWIs. No ST changes. POC TnI 0.02. pBNP 7488.0 .CXR reveals cardiomegaly, small R pleural effusion, otherwise unremarkable. BMET reveals BUN 41, Cr 2.14. Admit weight 232 pounds.  11/19/12 Started on Lasix 80 mg IV bid. 11/21/12 Continue on Lasix 80 mg IV tid and  Metolazone 5 mg daily. Hydralazine has been titrated up to 75 mg tid. Overall weight down  >20 pounds.   11/22/12 ECHO EF 60% Moderate LVH Mild Mitral regurgitation  Resting comfortably. Denies  SOB/PND/Orthopnea.  CVP still elevated 13-16 mmHg        Objective:   Weight Range:  Vital Signs:   Temp:  [97.6 F (36.4 C)-98.3 F (36.8 C)] 97.7 F (36.5 C) (12/07 0800) Pulse Rate:  [71-84] 77  (12/07 0800) Resp:  [13-20] 13  (12/07 0800) BP: (101-142)/(63-80) 119/69 mmHg (12/07 0800) SpO2:  [97 %-100 %] 97 % (12/07 0800) Weight:  [208 lb 5.4 oz (94.5 kg)] 208 lb 5.4 oz (94.5 kg) (12/07 0500) Last BM Date: 11/24/12  Weight change: Filed Weights   11/25/12 0500 11/26/12 0002 11/27/12 0500  Weight: 217 lb 6 oz (98.6 kg) 212 lb 4.9 oz (96.3 kg) 208 lb 5.4 oz (94.5 kg)    Intake/Output:   Intake/Output Summary (Last 24 hours) at 11/27/12 1136 Last data filed at 11/27/12 1100  Gross per 24 hour  Intake    780 ml  Output   2551 ml  Net  -1771 ml     Physical Exam: CVP  19 General:  Chronically ill appearing. No resp difficulty HEENT: normal Neck: supple. JVP to jaw. Carotids 2+ bilat; no bruits. No lymphadenopathy or thryomegaly appreciated. Cor: PMI nondisplaced. Irregular rate & rhythm. No rubs, gallops or murmurs. Lungs: clear Abdomen: soft, nontender, nondistended. No hepatosplenomegaly. No bruits or masses. Good bowel sounds. Extremities: no cyanosis, clubbing, rash, R and LLE 1+edema  Neuro: alert & orientedx3, cranial nerves grossly intact. moves all 4 extremities w/o difficulty. Affect pleasant  Telemetry: A fib 70-80s  Labs: Basic Metabolic Panel:  Lab 11/27/12 0865 11/26/12 0600 11/25/12 0559 11/24/12 0450 11/23/12 0521  NA 146* 146* 145 140 146*  K 4.3 4.2  4.1 4.2 4.5  CL 98 99 98 96 100  CO2 39* 39* 39* 37* 36*  GLUCOSE 89 82 81 95 96  BUN 53* 50* 49* 51* 47*  CREATININE 2.19* 2.02* 2.00* 2.03* 2.09*  CALCIUM 9.2 9.2 9.5 -- --  MG -- -- -- -- --  PHOS -- -- -- -- --    Liver Function Tests: No results found for this basename: AST:5,ALT:5,ALKPHOS:5,BILITOT:5,PROT:5,ALBUMIN:5 in the last 168 hours No results found for this basename:  LIPASE:5,AMYLASE:5 in the last 168 hours No results found for this basename: AMMONIA:3 in the last 168 hours  CBC:  Lab 11/27/12 0500 11/26/12 0600 11/25/12 0559 11/24/12 0450 11/23/12 0521  WBC 5.8 5.4 6.4 7.1 8.4  NEUTROABS -- -- -- -- --  HGB 10.1* 10.0* 10.2* 10.3* 10.6*  HCT 34.9* 34.8* 35.0* 34.8* 36.4  MCV 90.4 89.5 89.5 90.4 90.3  PLT 165 166 161 142* 145*    Cardiac Enzymes: No results found for this basename: CKTOTAL:5,CKMB:5,CKMBINDEX:5,TROPONINI:5 in the last 168 hours  BNP: BNP (last 3 results)  Basename 11/19/12 1655  PROBNP 7488.0*     Other results:    Imaging: No results found.   Medications:     Scheduled Medications:    . cloNIDine  0.2 mg Oral BID  . diltiazem  360 mg Oral Daily  . furosemide  80 mg Intravenous TID  . hydrALAZINE  75 mg Oral Q8H  . metolazone  5 mg Oral Daily  . metoprolol tartrate  25 mg Oral BID  . potassium chloride  40 mEq Oral BID  . senna-docusate  1 tablet Oral Daily  . sodium chloride  3 mL Intravenous Q12H  . warfarin  3 mg Oral q1800  . Warfarin - Pharmacist Dosing Inpatient   Does not apply q1800    Infusions:    PRN Medications: sodium chloride, acetaminophen, ALPRAZolam, ondansetron (ZOFRAN) IV, sodium chloride, traMADol, zolpidem   Assessment:   1. Acute on chronic diastolic CHF  2. Permanent atrial fibrillation  3. Morbid obesity  4. Chronic renal failure, stage 4  --solitary kidney  5. OSA  6. Physical debility/deconditioning - wheelchair bound  7. Noncompliance  8. Hypertension, poorly controlled   Plan/Discussion:   Continues to diurese well. Overall weight down 24 pounds but CVP still 13-16. Renal function essentially stable with mild increase in creatinine. Continue current diuretic regimen with IV Lasix and metolazone.  Would like to get her as dry as possible.   SBP better controlled. Continue current hydralazine and clonidine.   Coumadin per pharmacy. INR at goal.   Appreciate SW  consult. Anticipate D/C to SNF early next week when fully diuresed. Plan to d/c to [facility].   Length of Stay: 8 Breion Novacek,MD 11:36 AM

## 2012-11-27 NOTE — Progress Notes (Signed)
ANTICOAGULATION CONSULT NOTE - Follow Up Consult  Pharmacy Consult for coumadin Indication: atrial fibrillation  No Known Allergies  Patient Measurements: Height: 5\' 2"  (157.5 cm) Weight: 208 lb 5.4 oz (94.5 kg) IBW/kg (Calculated) : 50.1    Vital Signs: Temp: 97.5 F (36.4 C) (12/07 1156) Temp src: Axillary (12/07 1156) BP: 109/60 mmHg (12/07 1156) Pulse Rate: 73  (12/07 1156)  Labs:  Basename 11/27/12 0500 11/26/12 0600 11/25/12 0559  HGB 10.1* 10.0* --  HCT 34.9* 34.8* 35.0*  PLT 165 166 161  APTT -- -- --  LABPROT 24.6* 24.5* 23.0*  INR 2.34* 2.33* 2.14*  HEPARINUNFRC -- -- --  CREATININE 2.19* 2.02* 2.00*  CKTOTAL -- -- --  CKMB -- -- --  TROPONINI -- -- --    Estimated Creatinine Clearance: 22.3 ml/min (by C-G formula based on Cr of 2.19).   Assessment: Patient is a 78 y.o F on coumadin for afib.  INR is therapeutic and stable at 2.34.  No bleeding noted.  Goal of Therapy:  INR 2-3    Plan:  1) cont coumadin 3mg  daily  Brae Schaafsma P 11/27/2012,12:50 PM

## 2012-11-28 LAB — BASIC METABOLIC PANEL
GFR calc Af Amer: 21 mL/min — ABNORMAL LOW (ref >90)
GFR calc non Af Amer: 18 mL/min — ABNORMAL LOW (ref >90)
Glucose, Bld: 115 mg/dL — ABNORMAL HIGH (ref 70–99)
Potassium: 4.4 mEq/L (ref 3.5–5.1)
Sodium: 143 mEq/L (ref 135–145)

## 2012-11-28 LAB — PROTIME-INR
INR: 2.55 — ABNORMAL HIGH (ref 0.00–1.49)
Prothrombin Time: 26.2 seconds — ABNORMAL HIGH (ref 11.6–15.2)

## 2012-11-28 LAB — CBC
Hemoglobin: 9.7 g/dL — ABNORMAL LOW (ref 12.0–15.0)
MCHC: 29.3 g/dL — ABNORMAL LOW (ref 30.0–36.0)
WBC: 5.2 10*3/uL (ref 4.0–10.5)

## 2012-11-28 NOTE — Progress Notes (Signed)
ANTICOAGULATION CONSULT NOTE - Follow Up Consult  Pharmacy Consult for coumadin Indication: atrial fibrillation  No Known Allergies  Patient Measurements: Height: 5\' 2"  (157.5 cm) Weight: 206 lb 12.7 oz (93.8 kg) IBW/kg (Calculated) : 50.1    Vital Signs: Temp: 96.1 F (35.6 C) (12/08 1300) Temp src: Oral (12/08 1300) BP: 109/70 mmHg (12/08 1300) Pulse Rate: 80  (12/08 0823)  Labs:  Basename 11/28/12 0435 11/27/12 0500 11/26/12 0600  HGB 9.7* 10.1* --  HCT 33.1* 34.9* 34.8*  PLT 174 165 166  APTT -- -- --  LABPROT 26.2* 24.6* 24.5*  INR 2.55* 2.34* 2.33*  HEPARINUNFRC -- -- --  CREATININE 2.44* 2.19* 2.02*  CKTOTAL -- -- --  CKMB -- -- --  TROPONINI -- -- --    Estimated Creatinine Clearance: 20 ml/min (by C-G formula based on Cr of 2.44).   Assessment: Patient is a 78 y.o F on coumadin for afib.  INR is therapeutic and stable at 2.55  No bleeding noted.  Goal of Therapy:  INR 2-3    Plan:  Cont coumadin 3mg  daily. Daily PT/INR.  Wendie Simmer, PharmD, BCPS Clinical Pharmacist  Pager: [phone]

## 2012-11-28 NOTE — Progress Notes (Signed)
Patient ID: Akshitha Culmer, female   DOB: May 02, 1931, 78 y.o.   MRN: [id number] Advanced Heart Failure Rounding Note   Subjective:    Ms. Tonkinson is a 78yo female with PMHx s/f chronic diastolic CHF, congenital solitary kidney, CKD (stage IV, baseline Cr 1.5-2.4), COPD + OSA + morbid obesity ->secondary pulmonary HTN, permanent atrial fibrillation, OSA, GERD who presents to [facility] ED with shortness of breath.   She last followed up in CHF clinic in 05/2012. Baseline weight 195 lbs. Denied weighing daily. Denied needing  increased Lasix. Drinking 2L fluid/day. Fluid restriction enforced. Two month follow-up recommended, but this is not documented.  10/2011 echo: LVEF 55-60%, moderate LVH, mod-severe biatrial enlargement, mild MR, mod-severe TR, normal RV size and function.   She reports experiencing progressive SOB and worsening LE edema. She has noticed a subjective weight increase. Denies chest pain, palpitations, PND, orthopnea. She does endorse new cough productive of white sputum. She is quite limited by arthritis, and is essentially wheelchair bound. She lives alone and no longer received home health services. She has not followed up in the CHF clinic since June. She admits to occasional dietary indiscretion, including Thanksgiving yesterday. No recent illness, fevers or chills. No active bleeding. Reports compliance with Lasix and Coumadin. Daughter is quite concerned about living situation.   In the ED, EKG reveals nonspecific inferior subtle TWIs. No ST changes. POC TnI 0.02. pBNP 7488.0 .CXR reveals cardiomegaly, small R pleural effusion, otherwise unremarkable. BMET reveals BUN 41, Cr 2.14. Admit weight 232 pounds.  11/19/12 Started on Lasix 80 mg IV bid. 11/21/12 Continue on Lasix 80 mg IV tid and  Metolazone 5 mg daily. Hydralazine has been titrated up to 75 mg tid. Overall weight down 26 pounds.   11/22/12 ECHO EF 60% Moderate LVH Mild Mitral regurgitation  Resting comfortably. Denies  SOB/PND/Orthopnea.  CVP checked this morning at about 10, which is somewhat lower than in the past.         Objective:   Weight Range:  Vital Signs:   Temp:  [96.7 F (35.9 C)-97.8 F (36.6 C)] 96.7 F (35.9 C) (12/08 0400) Pulse Rate:  [65-81] 80  (12/08 0823) Resp:  [13-23] 15  (12/08 0823) BP: (109-137)/(56-82) 115/82 mmHg (12/08 0823) SpO2:  [97 %-99 %] 97 % (12/08 0823) Weight:  [206 lb 12.7 oz (93.8 kg)] 206 lb 12.7 oz (93.8 kg) (12/08 0400) Last BM Date: 11/24/12  Weight change: Filed Weights   11/26/12 0002 11/27/12 0500 11/28/12 0400  Weight: 212 lb 4.9 oz (96.3 kg) 208 lb 5.4 oz (94.5 kg) 206 lb 12.7 oz (93.8 kg)    Intake/Output:   Intake/Output Summary (Last 24 hours) at 11/28/12 1048 Last data filed at 11/28/12 0946  Gross per 24 hour  Intake    753 ml  Output   2126 ml  Net  -1373 ml     Physical Exam: CVP  19 General:  Chronically ill appearing. No resp difficulty HEENT: normal Neck: supple. JVP 9-10 cm. Carotids 2+ bilat; no bruits. No lymphadenopathy or thryomegaly appreciated. Cor: PMI nondisplaced. Irregular rate & rhythm. No rubs, gallops or murmurs. Lungs: clear Abdomen: soft, nontender, nondistended. No hepatosplenomegaly. No bruits or masses. Good bowel sounds. Extremities: no cyanosis, clubbing, rash, R and LLE trace edema.  Neuro: alert & orientedx3, cranial nerves grossly intact. moves all 4 extremities w/o difficulty. Affect pleasant  Telemetry: A fib 70-80s  Labs: Basic Metabolic Panel:  Lab 11/28/12 4098 11/27/12 0500 11/26/12 0600 11/25/12 0559 11/24/12 0450  NA 143 146* 146* 145 140  K 4.4 4.3 4.2 4.1 4.2  CL 98 98 99 98 96  CO2 39* 39* 39* 39* 37*  GLUCOSE 115* 89 82 81 95  BUN 58* 53* 50* 49* 51*  CREATININE 2.44* 2.19* 2.02* 2.00* 2.03*  CALCIUM 8.8 9.2 9.2 -- --  MG -- -- -- -- --  PHOS -- -- -- -- --    Liver Function Tests: No results found for this basename: AST:5,ALT:5,ALKPHOS:5,BILITOT:5,PROT:5,ALBUMIN:5 in  the last 168 hours No results found for this basename: LIPASE:5,AMYLASE:5 in the last 168 hours No results found for this basename: AMMONIA:3 in the last 168 hours  CBC:  Lab 11/28/12 0435 11/27/12 0500 11/26/12 0600 11/25/12 0559 11/24/12 0450  WBC 5.2 5.8 5.4 6.4 7.1  NEUTROABS -- -- -- -- --  HGB 9.7* 10.1* 10.0* 10.2* 10.3*  HCT 33.1* 34.9* 34.8* 35.0* 34.8*  MCV 91.4 90.4 89.5 89.5 90.4  PLT 174 165 166 161 142*    Cardiac Enzymes: No results found for this basename: CKTOTAL:5,CKMB:5,CKMBINDEX:5,TROPONINI:5 in the last 168 hours  BNP: BNP (last 3 results)  Basename 11/19/12 1655  PROBNP 7488.0*     Other results:    Imaging: No results found.   Medications:     Scheduled Medications:    . cloNIDine  0.2 mg Oral BID  . diltiazem  360 mg Oral Daily  . hydrALAZINE  75 mg Oral Q8H  . metoprolol tartrate  25 mg Oral BID  . potassium chloride  40 mEq Oral BID  . senna-docusate  1 tablet Oral Daily  . sodium chloride  3 mL Intravenous Q12H  . warfarin  3 mg Oral q1800  . Warfarin - Pharmacist Dosing Inpatient   Does not apply q1800  . [DISCONTINUED] furosemide  80 mg Intravenous TID  . [DISCONTINUED] metolazone  5 mg Oral Daily    Infusions:    PRN Medications: sodium chloride, acetaminophen, ALPRAZolam, ondansetron (ZOFRAN) IV, sodium chloride, traMADol, zolpidem   Assessment:   1. Acute on chronic diastolic CHF  2. Permanent atrial fibrillation  3. Morbid obesity  4. Chronic renal failure, stage 4  --solitary kidney  5. OSA  6. Physical debility/deconditioning - wheelchair bound  7. Noncompliance  8. Hypertension, poorly controlled   Plan/Discussion:   She is now down 26 lbs since admission, another 2 lbs in the last 24 hrs.  BUN/creatinine rose over the last day with creatinine up to 2.44.  CVP is lower this morning than it has been in the past at 10.  She had one dose of Lasix and metolazone this morning.  I will hold the rest of her IV  Lasix today and plan to start po torsemide tomorrow, would likely use 80 mg daily.   SBP better controlled. Continue current hydralazine and clonidine.   Coumadin per pharmacy. INR at goal.   Appreciate SW consult. Anticipate D/C to SNF early in the week. Plan to d/c to [facility].   Length of Stay: 9 Charron Coultas,MD 10:48 AM

## 2012-11-29 DIAGNOSIS — I2789 Other specified pulmonary heart diseases: Secondary | ICD-10-CM

## 2012-11-29 LAB — PROTIME-INR: INR: 2.5 — ABNORMAL HIGH (ref 0.00–1.49)

## 2012-11-29 LAB — CBC
HCT: 33.7 % — ABNORMAL LOW (ref 36.0–46.0)
Platelets: 190 10*3/uL (ref 150–400)
RDW: 16.9 % — ABNORMAL HIGH (ref 11.5–15.5)
WBC: 4.8 10*3/uL (ref 4.0–10.5)

## 2012-11-29 LAB — BASIC METABOLIC PANEL
Chloride: 98 mEq/L (ref 96–112)
GFR calc Af Amer: 20 mL/min — ABNORMAL LOW (ref >90)
Potassium: 4.6 mEq/L (ref 3.5–5.1)

## 2012-11-29 LAB — URINALYSIS, ROUTINE W REFLEX MICROSCOPIC
Nitrite: NEGATIVE
Specific Gravity, Urine: 1.012 (ref 1.005–1.030)
pH: 8 (ref 5.0–8.0)

## 2012-11-29 LAB — URINE MICROSCOPIC-ADD ON

## 2012-11-29 NOTE — Progress Notes (Signed)
Physical Therapy Treatment Patient Details Name: Desiree Pierce MRN: [id number] DOB: 25-Oct-1931 Today's Date: 11/29/2012 Time: 4098-1191 PT Time Calculation (min): 28 min  PT Assessment / Plan / Recommendation Comments on Treatment Session  Pt. admitted with CHF and edema in all extremities; Pt. continues to progress with ambulation despite pain in LEs due to arthritis. Pt. had increased difficulty gripping RW today secondary to arthritis in hands. Pt. continues to be pleasant and willing to ambulate. Educated to perform all LE exercises throughout the day; pt. states she does ankle pumps in the bed.     Follow Up Recommendations  SNF           Equipment Recommendations  None recommended by PT       Frequency Min 3X/week   Plan Discharge plan remains appropriate;Frequency remains appropriate    Precautions / Restrictions Precautions Precautions: Fall Restrictions Weight Bearing Restrictions: No   Pertinent Vitals/Pain O2 dropped to 88% on 2L with ambulation; 97% at end of tx with pt. Sitting in chair HR 84 at highest during activity   Mobility  Bed Mobility Bed Mobility: Supine to Sit;Sitting - Scoot to Edge of Bed Supine to Sit: 3: Mod assist;HOB elevated (30 degrees) Sitting - Scoot to Edge of Bed: 4: Min guard Details for Bed Mobility Assistance: Pt. able to manage LEs but has difficulting grabing rails secondary to arthritis in hands. Pt. required HH assist to achieve upright. Increased time to get situated and scoot EOB. Transfers Sit to Stand: 3: Mod assist;From bed;With upper extremity assist;From chair/3-in-1 Stand to Sit: 3: Mod assist;With armrests;To chair/3-in-1 Details for Transfer Assistance: Performed x2 however pt. needed two attempts with each stand; pt. stating she needed to adjust her feet. Pt. required cueing for hand placement and sequencing. Pt. unsteady once in standing; cued to get her feet under her and stand upright. Ambulation/Gait Ambulation/Gait  Assistance: 4: Min assist Ambulation Distance (Feet): 30 Feet (30; 23) Assistive device: Rolling walker Ambulation/Gait Assistance Details: Pt. required cueing to stand upright during ambulation and to take her time; pt. rushing to be finished secondary to pain. Pt. needed assistance to turn RW.  Gait velocity: decreased Stairs: No Wheelchair Mobility Wheelchair Mobility: No    Exercises General Exercises - Lower Extremity Ankle Circles/Pumps: AROM;20 reps;Both;Seated Long Arc Quad: AROM;Both;10 reps;Seated Hip Flexion/Marching: AROM;Both;10 reps;Seated     PT Goals Acute Rehab PT Goals PT Goal: Supine/Side to Sit - Progress: Progressing toward goal PT Goal: Sit to Stand - Progress: Progressing toward goal PT Goal: Ambulate - Progress: Progressing toward goal  Visit Information  Last PT Received On: 11/29/12 Assistance Needed: +2 (for chair follow)    Subjective Data  Subjective: "This weather makes my arthritis worse."   Cognition  Overall Cognitive Status: Appears within functional limits for tasks assessed/performed Arousal/Alertness: Awake/alert Orientation Level: Appears intact for tasks assessed Behavior During Session: [facility] for tasks performed       End of Session PT - End of Session Equipment Utilized During Treatment: Gait belt;Oxygen Activity Tolerance: Patient tolerated treatment well Patient left: in chair;with call bell/phone within reach Nurse Communication: Mobility status     Army Chaco SPT 11/29/2012, 8:53 AM

## 2012-11-29 NOTE — Progress Notes (Signed)
Advanced Heart Failure Rounding Note   Subjective:    Ms. Rosenstock is a 78yo female with PMHx s/f chronic diastolic CHF, congenital solitary kidney, CKD (stage IV, baseline Cr 1.5-2.4), COPD + OSA + morbid obesity ->secondary pulmonary HTN, permanent atrial fibrillation, OSA, GERD who presents to [facility] ED with shortness of breath.   She last followed up in CHF clinic in 05/2012. Baseline weight 195 lbs.  She lives alone and no longer received home health services. She is quite limited by arthritis, and is essentially wheelchair bound.  10/2011 echo: LVEF 55-60%, moderate LVH, mod-severe biatrial enlargement, mild MR, mod-severe TR, normal RV size and function.   Admitted with progressive SOB and worsening LE edema.  POC TnI 0.02. pBNP 7488.0 .CXR reveals cardiomegaly, small R pleural effusion, otherwise unremarkable. BMET reveals BUN 41, Cr 2.14. Admit weight 232 pounds.  11/19/12 Started on Lasix 80 mg IV bid. 11/21/12 Continue on Lasix 80 mg IV tid and  Metolalzone 5 mg daily.   11/22/12 ECHO EF 60% Moderate LVH Mild Mitral regurgitation. +RV dysfunction  Weight down 1 pound overnight.  (27 pounds since admit).  Cr steadily climbing 1.98>>>>2.46.  IV lasix stopped yesterday.     CVP: 18. Feels better. Denies SOB, orthopnea or PND.   Objective:    Vital Signs:   Temp:  [97.1 F (36.2 C)-98.1 F (36.7 C)] 97.5 F (36.4 C) (12/09 1527) Pulse Rate:  [68-85] 85  (12/09 1142) Resp:  [14-26] 20  (12/09 1527) BP: (102-137)/(60-80) 125/70 mmHg (12/09 1527) SpO2:  [91 %-100 %] 95 % (12/09 1527) Weight:  [93.2 kg (205 lb 7.5 oz)] 93.2 kg (205 lb 7.5 oz) (12/09 0700) Last BM Date: 11/28/12  Weight change: Filed Weights   11/27/12 0500 11/28/12 0400 11/29/12 0700  Weight: 94.5 kg (208 lb 5.4 oz) 93.8 kg (206 lb 12.7 oz) 93.2 kg (205 lb 7.5 oz)    Intake/Output:   Intake/Output Summary (Last 24 hours) at 11/29/12 1720 Last data filed at 11/29/12 1600  Gross per 24 hour  Intake    603 ml   Output   1775 ml  Net  -1172 ml     Physical Exam:  General:  Sitting in chair No resp difficulty HEENT: normal Neck: supple. JVP to jaw. Carotids 2+ bilat; no bruits. No lymphadenopathy or thryomegaly appreciated. Cor: PMI nondisplaced. Irregular rate & rhythm. No rubs, gallops or murmurs. Lungs: clear Abdomen: soft, nontender, nondistended. No hepatosplenomegaly. No bruits or masses. Good bowel sounds. Extremities: no cyanosis, clubbing, rash, R and LLE tr edema  Neuro: alert & orientedx3, cranial nerves grossly intact. moves all 4 extremities w/o difficulty. Affect pleasant  Telemetry: A fib 70-80s  Labs: Basic Metabolic Panel:  Lab 11/29/12 0445 11/28/12 0435 11/27/12 0500 11/26/12 0600 11/25/12 0559  NA 143 143 146* 146* 145  K 4.6 4.4 4.3 4.2 4.1  CL 98 98 98 99 98  CO2 39* 39* 39* 39* 39*  GLUCOSE 93 115* 89 82 81  BUN 61* 58* 53* 50* 49*  CREATININE 2.46* 2.44* 2.19* 2.02* 2.00*  CALCIUM 9.0 8.8 9.2 -- --  MG -- -- -- -- --  PHOS -- -- -- -- --    Liver Function Tests: No results found for this basename: AST:5,ALT:5,ALKPHOS:5,BILITOT:5,PROT:5,ALBUMIN:5 in the last 168 hours No results found for this basename: LIPASE:5,AMYLASE:5 in the last 168 hours No results found for this basename: AMMONIA:3 in the last 168 hours  CBC:  Lab 11/29/12 0445 11/28/12 0435 11/27/12 0500 11/26/12 0600   11/25/12 0559  WBC 4.8 5.2 5.8 5.4 6.4  NEUTROABS -- -- -- -- --  HGB 9.8* 9.7* 10.1* 10.0* 10.2*  HCT 33.7* 33.1* 34.9* 34.8* 35.0*  MCV 91.3 91.4 90.4 89.5 89.5  PLT 190 174 165 166 161    Cardiac Enzymes: No results found for this basename: CKTOTAL:5,CKMB:5,CKMBINDEX:5,TROPONINI:5 in the last 168 hours  BNP: BNP (last 3 results)  Basename 11/19/12 1655  PROBNP 7488.0*     Other results:    Imaging: No results found.   Medications:     Scheduled Medications:    . cloNIDine  0.2 mg Oral BID  . diltiazem  360 mg Oral Daily  . hydrALAZINE  75 mg Oral Q8H   . metoprolol tartrate  25 mg Oral BID  . potassium chloride  40 mEq Oral BID  . senna-docusate  1 tablet Oral Daily  . sodium chloride  3 mL Intravenous Q12H  . warfarin  3 mg Oral q1800  . Warfarin - Pharmacist Dosing Inpatient   Does not apply q1800    Infusions:    PRN Medications: sodium chloride, acetaminophen, ALPRAZolam, ondansetron (ZOFRAN) IV, sodium chloride, traMADol, zolpidem   Assessment:   1. Acute on chronic diastolic CHF  2. Permanent atrial fibrillation  3. Morbid obesity  4. Chronic renal failure, stage 4       --solitary kidney  5. OSA  6. Physical debility/deconditioning - wheelchair bound  7. Noncompliance  8. Hypertension, poorly controlled  Plan/Discussion:    Continues to diurese despite holding lasix yesterday.  Weight now down 27 pounds.  Renal function has stabilized but up from baseline (1.9>>>2.46).  Urine quite dark despite CVP 18.  I have reviewed her echo and she has significant PH and RV dysfunction. I discussed possibility of RHC with her and her daughter to further sort out. Will plan RHC in am.  Rate controlled.  INR at goal.    Anticipate D/C in next 48 hours to SNF. Plan to d/c to [facility].   Length of Stay: 10 Daniel Bensimhon,MD 5:23 PM        

## 2012-11-29 NOTE — Progress Notes (Signed)
ANTICOAGULATION CONSULT NOTE - Follow Up Consult  Pharmacy Consult for coumadin Indication: atrial fibrillation  No Known Allergies  Patient Measurements: Height: 5\' 2"  (157.5 cm) Weight: 206 lb 12.7 oz (93.8 kg) IBW/kg (Calculated) : 50.1    Vital Signs: Temp: 97.7 F (36.5 C) (12/09 0400) Temp src: Oral (12/09 0400) BP: 113/72 mmHg (12/09 0620) Pulse Rate: 73  (12/09 0620)  Labs:  Basename 11/29/12 0445 11/28/12 0435 11/27/12 0500  HGB 9.8* 9.7* --  HCT 33.7* 33.1* 34.9*  PLT 190 174 165  APTT -- -- --  LABPROT 25.8* 26.2* 24.6*  INR 2.50* 2.55* 2.34*  HEPARINUNFRC -- -- --  CREATININE 2.46* 2.44* 2.19*  CKTOTAL -- -- --  CKMB -- -- --  TROPONINI -- -- --   Estimated Creatinine Clearance: 19.8 ml/min (by C-G formula based on Cr of 2.46).  Admit Complaint: Patient is a 78 y.o F on coumadin for afib admitted 11/19/2012 with worsening SOB and LEE.  Pharmacy consulted to dose warfarin.  Home warfarin dose: 2.5 mg qMWF and 3.75 mg qTTSS  Assessment: Anticoagulation: Afib, INR at goal  (stable), CBC stable no bleeding noted  Infectious Disease: afeb, wbc wnl, no abx  Cardiovascular:diastolic CHF, EF 78-29% , OSA with secondary pulm HTN, permanent afib: clonidine 0.2 bid, Hydralazine 75 TID, Dilt 360, metop 25 bid, K 40 BID (K 4.6). Lasix 80 IV x1 yest BP 113/72, HR 60-100s i/o 863/2100, CVP 18, episodes of desaturation per RN Admit Wt 103kg:    Current Weight 93.2kg  Endocrinology: CBGs below goal  Gastrointestinal / Nutrition: GERD. Heart healthy diet  Nephrology/Urology/Electrolytes: solo kidney, CKD, CrCl 20 (SCr trending up 2.46 on diuretics)  Pulmonary: COPD, 100% on 2L ,   PTA Medication Issues: furo 40 BID Best Practices: coumadin  Dispo >>SNF next week   Goal of Therapy:  INR 2-3  Plan:  Cont coumadin 3mg  daily. Daily PT/INR.  Thank you for allowing pharmacy to be a part of this patients care team.  Lovenia Kim Pharm.D., BCPS Clinical  Pharmacist 11/29/2012 7:19 AM Pager: [phone] Phone: [phone]

## 2012-11-29 NOTE — Progress Notes (Signed)
Seen and agree with SPT note Samiha Denapoli Tabor Zacchaeus Halm, PT [phone]  

## 2012-11-29 NOTE — Progress Notes (Signed)
Nutrition Brief Note  Patient identified on the Malnutrition Screening Tool (MST) Report.  Body mass index is 37.58 kg/(m^2). Pt meets criteria for Obesity Clas II based on current BMI.   Current diet order is Heart Healthy, patient is consuming approximately 100% of meals at this time. Labs and medications reviewed.   No nutrition interventions warranted at this time. If nutrition issues arise, please consult RD.   Kirkland Hun, RD, LDN Pager #: [phone] After-Hours Pager #: [phone]

## 2012-11-30 ENCOUNTER — Encounter (HOSPITAL_COMMUNITY)
Admission: EM | Disposition: A | Discharge status: Nursing Home (Includes ICF) | Payer: Self-pay | Source: Home / Self Care | Attending: Internal Medicine

## 2012-11-30 DIAGNOSIS — I509 Heart failure, unspecified: Secondary | ICD-10-CM

## 2012-11-30 HISTORY — PX: RIGHT HEART CATHETERIZATION: SHX5447

## 2012-11-30 LAB — URINE CULTURE

## 2012-11-30 LAB — POCT I-STAT 3, ART BLOOD GAS (G3+)
Acid-Base Excess: 10 mmol/L — ABNORMAL HIGH (ref 0.0–2.0)
Bicarbonate: 35.5 mEq/L — ABNORMAL HIGH (ref 20.0–24.0)
TCO2: 37 mmol/L (ref 0–100)

## 2012-11-30 LAB — POCT I-STAT 3, VENOUS BLOOD GAS (G3P V)
Bicarbonate: 31.2 mEq/L — ABNORMAL HIGH (ref 20.0–24.0)
Bicarbonate: 33.4 mEq/L — ABNORMAL HIGH (ref 20.0–24.0)
O2 Saturation: 55 %
O2 Saturation: 57 %
TCO2: 33 mmol/L (ref 0–100)
TCO2: 35 mmol/L (ref 0–100)
pCO2, Ven: 59.7 mmHg — ABNORMAL HIGH (ref 45.0–50.0)

## 2012-11-30 LAB — BASIC METABOLIC PANEL
Chloride: 100 mEq/L (ref 96–112)
Creatinine, Ser: 2.39 mg/dL — ABNORMAL HIGH (ref 0.50–1.10)
GFR calc Af Amer: 21 mL/min — ABNORMAL LOW (ref >90)
GFR calc non Af Amer: 18 mL/min — ABNORMAL LOW (ref >90)

## 2012-11-30 LAB — CBC
MCHC: 28.9 g/dL — ABNORMAL LOW (ref 30.0–36.0)
MCV: 90 fL (ref 78.0–100.0)
Platelets: 182 10*3/uL (ref 150–400)
RDW: 16.9 % — ABNORMAL HIGH (ref 11.5–15.5)
WBC: 5.1 10*3/uL (ref 4.0–10.5)

## 2012-11-30 LAB — PROTIME-INR: INR: 2.43 — ABNORMAL HIGH (ref 0.00–1.49)

## 2012-11-30 SURGERY — RIGHT HEART CATH
Anesthesia: LOCAL

## 2012-11-30 MED ORDER — LIDOCAINE HCL (PF) 1 % IJ SOLN
INTRAMUSCULAR | Status: AC
  Filled 2012-11-30: qty 30

## 2012-11-30 MED ORDER — ONDANSETRON HCL 4 MG/2ML IJ SOLN: 4.0000 mg | Freq: Four times a day (QID) | INTRAMUSCULAR | Status: DC | PRN

## 2012-11-30 MED ORDER — FENTANYL CITRATE 0.05 MG/ML IJ SOLN
INTRAMUSCULAR | Status: AC
  Filled 2012-11-30: qty 2

## 2012-11-30 MED ORDER — ACETAMINOPHEN 325 MG PO TABS: 650.0000 mg | ORAL_TABLET | ORAL | Status: DC | PRN

## 2012-11-30 MED ORDER — SILDENAFIL CITRATE 20 MG PO TABS
20.0000 mg | ORAL_TABLET | Freq: Three times a day (TID) | ORAL | Status: DC
  Administered 2012-11-30 – 2012-12-02 (×8): 20 mg via ORAL
  Filled 2012-11-30 (×11): qty 1

## 2012-11-30 MED ORDER — MILRINONE IN DEXTROSE 20 MG/100ML IV SOLN
0.2500 ug/kg/min | INTRAVENOUS | Status: DC
  Administered 2012-11-30 – 2012-12-01 (×2): 0.25 ug/kg/min via INTRAVENOUS
  Filled 2012-11-30 (×2): qty 100

## 2012-11-30 MED ORDER — CIPROFLOXACIN HCL 250 MG PO TABS
250.0000 mg | ORAL_TABLET | Freq: Two times a day (BID) | ORAL | Status: DC
  Administered 2012-11-30 – 2012-12-01 (×4): 250 mg via ORAL
  Filled 2012-11-30 (×6): qty 1

## 2012-11-30 MED ORDER — HEPARIN (PORCINE) IN NACL 2-0.9 UNIT/ML-% IJ SOLN
INTRAMUSCULAR | Status: AC
  Filled 2012-11-30: qty 500

## 2012-11-30 MED ORDER — FUROSEMIDE 10 MG/ML IJ SOLN
10.0000 mg/h | INTRAVENOUS | Status: DC
  Administered 2012-12-01: 10 mg/h via INTRAVENOUS
  Filled 2012-11-30 (×2): qty 25

## 2012-11-30 MED ORDER — POTASSIUM CHLORIDE CRYS ER 20 MEQ PO TBCR
20.0000 meq | EXTENDED_RELEASE_TABLET | Freq: Every day | ORAL | Status: DC
  Administered 2012-11-30 – 2012-12-02 (×3): 20 meq via ORAL
  Filled 2012-11-30 (×3): qty 1

## 2012-11-30 NOTE — CV Procedure (Signed)
Cardiac Cath Procedure Note:  Indication:   Procedures performed:  1) Right heart catheterization  Description of procedure:   The risks and indication of the procedure were explained. Consent was signed and placed on the chart. An appropriate timeout was taken prior to the procedure. The right groin was prepped and draped in the routine sterile fashion and anesthetized with 1% local lidocaine.   A 7 FR venous sheath was placed in the right femoral vein using a modified Seldinger technique. A standard Swan-Ganz catheter was used for the procedure.   Complications: None apparent.  Findings:  Heart Failure, Pulmonary HTN  RA = 21 RV = 80/11/19 PA = 88/34 (56) PCW = 24  v = 35 Fick cardiac output/index = 4.9/2.5 PVR = 7.3 woods FA sat = 96% PA sat = 55%, 57%  Assessment:  1. Severe PH with both left and right sided components 2. Preserved cardiac output  Plan/Discussion:  She has severe PH with both right and left sided components. Suspect primary issues is diastolic HF but PH is currently out of proportion to left-sided pressures after 20 pound diuresis. Will add milrinone to see if we can facilitate further diuresis. Consider trial of oral sildenafil as well.   Daniel Bensimhon 3:44 PM

## 2012-11-30 NOTE — H&P (View-Only) (Signed)
Advanced Heart Failure Rounding Note   Subjective:    Desiree Pierce is a 78yo female with PMHx s/f chronic diastolic CHF, congenital solitary kidney, CKD (stage IV, baseline Cr 1.5-2.4), COPD + OSA + morbid obesity ->secondary pulmonary HTN, permanent atrial fibrillation, OSA, GERD who presents to [facility] ED with shortness of breath.   She last followed up in CHF clinic in 05/2012. Baseline weight 195 lbs.  She lives alone and no longer received home health services. She is quite limited by arthritis, and is essentially wheelchair bound.  10/2011 echo: LVEF 55-60%, moderate LVH, mod-severe biatrial enlargement, mild MR, mod-severe TR, normal RV size and function.   Admitted with progressive SOB and worsening LE edema.  POC TnI 0.02. pBNP 7488.0 .CXR reveals cardiomegaly, small R pleural effusion, otherwise unremarkable. BMET reveals BUN 41, Cr 2.14. Admit weight 232 pounds.  11/19/12 Started on Lasix 80 mg IV bid. 11/21/12 Continue on Lasix 80 mg IV tid and  Metolalzone 5 mg daily.   11/22/12 ECHO EF 60% Moderate LVH Mild Mitral regurgitation. +RV dysfunction  Weight down 1 pound overnight.  (27 pounds since admit).  Cr steadily climbing 1.98>>>>2.46.  IV lasix stopped yesterday.     CVP: 18. Feels better. Denies SOB, orthopnea or PND.   Objective:    Vital Signs:   Temp:  [97.1 F (36.2 C)-98.1 F (36.7 C)] 97.5 F (36.4 C) (12/09 1527) Pulse Rate:  [68-85] 85  (12/09 1142) Resp:  [14-26] 20  (12/09 1527) BP: (102-137)/(60-80) 125/70 mmHg (12/09 1527) SpO2:  [91 %-100 %] 95 % (12/09 1527) Weight:  [93.2 kg (205 lb 7.5 oz)] 93.2 kg (205 lb 7.5 oz) (12/09 0700) Last BM Date: 11/28/12  Weight change: Filed Weights   11/27/12 0500 11/28/12 0400 11/29/12 0700  Weight: 94.5 kg (208 lb 5.4 oz) 93.8 kg (206 lb 12.7 oz) 93.2 kg (205 lb 7.5 oz)    Intake/Output:   Intake/Output Summary (Last 24 hours) at 11/29/12 1720 Last data filed at 11/29/12 1600  Gross per 24 hour  Intake    603 ml   Output   1775 ml  Net  -1172 ml     Physical Exam:  General:  Sitting in chair No resp difficulty HEENT: normal Neck: supple. JVP to jaw. Carotids 2+ bilat; no bruits. No lymphadenopathy or thryomegaly appreciated. Cor: PMI nondisplaced. Irregular rate & rhythm. No rubs, gallops or murmurs. Lungs: clear Abdomen: soft, nontender, nondistended. No hepatosplenomegaly. No bruits or masses. Good bowel sounds. Extremities: no cyanosis, clubbing, rash, R and LLE tr edema  Neuro: alert & orientedx3, cranial nerves grossly intact. moves all 4 extremities w/o difficulty. Affect pleasant  Telemetry: A fib 70-80s  Labs: Basic Metabolic Panel:  Lab 11/29/12 1610 11/28/12 0435 11/27/12 0500 11/26/12 0600 11/25/12 0559  NA 143 143 146* 146* 145  K 4.6 4.4 4.3 4.2 4.1  CL 98 98 98 99 98  CO2 39* 39* 39* 39* 39*  GLUCOSE 93 115* 89 82 81  BUN 61* 58* 53* 50* 49*  CREATININE 2.46* 2.44* 2.19* 2.02* 2.00*  CALCIUM 9.0 8.8 9.2 -- --  MG -- -- -- -- --  PHOS -- -- -- -- --    Liver Function Tests: No results found for this basename: AST:5,ALT:5,ALKPHOS:5,BILITOT:5,PROT:5,ALBUMIN:5 in the last 168 hours No results found for this basename: LIPASE:5,AMYLASE:5 in the last 168 hours No results found for this basename: AMMONIA:3 in the last 168 hours  CBC:  Lab 11/29/12 0445 11/28/12 0435 11/27/12 0500 11/26/12 0600  11/25/12 0559  WBC 4.8 5.2 5.8 5.4 6.4  NEUTROABS -- -- -- -- --  HGB 9.8* 9.7* 10.1* 10.0* 10.2*  HCT 33.7* 33.1* 34.9* 34.8* 35.0*  MCV 91.3 91.4 90.4 89.5 89.5  PLT 190 174 165 166 161    Cardiac Enzymes: No results found for this basename: CKTOTAL:5,CKMB:5,CKMBINDEX:5,TROPONINI:5 in the last 168 hours  BNP: BNP (last 3 results)  Basename 11/19/12 1655  PROBNP 7488.0*     Other results:    Imaging: No results found.   Medications:     Scheduled Medications:    . cloNIDine  0.2 mg Oral BID  . diltiazem  360 mg Oral Daily  . hydrALAZINE  75 mg Oral Q8H   . metoprolol tartrate  25 mg Oral BID  . potassium chloride  40 mEq Oral BID  . senna-docusate  1 tablet Oral Daily  . sodium chloride  3 mL Intravenous Q12H  . warfarin  3 mg Oral q1800  . Warfarin - Pharmacist Dosing Inpatient   Does not apply q1800    Infusions:    PRN Medications: sodium chloride, acetaminophen, ALPRAZolam, ondansetron (ZOFRAN) IV, sodium chloride, traMADol, zolpidem   Assessment:   1. Acute on chronic diastolic CHF  2. Permanent atrial fibrillation  3. Morbid obesity  4. Chronic renal failure, stage 4       --solitary kidney  5. OSA  6. Physical debility/deconditioning - wheelchair bound  7. Noncompliance  8. Hypertension, poorly controlled  Plan/Discussion:    Continues to diurese despite holding lasix yesterday.  Weight now down 27 pounds.  Renal function has stabilized but up from baseline (1.9>>>2.46).  Urine quite dark despite CVP 18.  I have reviewed her echo and she has significant PH and RV dysfunction. I discussed possibility of RHC with her and her daughter to further sort out. Will plan RHC in am.  Rate controlled.  INR at goal.    Anticipate D/C in next 48 hours to SNF. Plan to d/c to [facility].   Length of Stay: 10 Truman Hayward 5:23 PM

## 2012-11-30 NOTE — Progress Notes (Signed)
ANTICOAGULATION CONSULT NOTE - Follow Up Consult  Pharmacy Consult for coumadin Indication: atrial fibrillation  No Known Allergies  Patient Measurements: Height: 5\' 2"  (157.5 cm) Weight: 205 lb 7.5 oz (93.2 kg) IBW/kg (Calculated) : 50.1   Vital Signs: Temp: 98.1 F (36.7 C) (12/10 0500) Temp src: Oral (12/10 0020) BP: 124/67 mmHg (12/10 0500) Pulse Rate: 75  (12/10 0500)  Labs:  Basename 11/30/12 0500 11/29/12 0445 11/28/12 0435  HGB 9.6* 9.8* --  HCT 33.2* 33.7* 33.1*  PLT 182 190 174  APTT -- -- --  LABPROT 25.3* 25.8* 26.2*  INR 2.43* 2.50* 2.55*  HEPARINUNFRC -- -- --  CREATININE 2.39* 2.46* 2.44*  CKTOTAL -- -- --  CKMB -- -- --  TROPONINI -- -- --   Estimated Creatinine Clearance: 20.3 ml/min (by C-G formula based on Cr of 2.39).  Admit Complaint: Patient is a 78 y.o F on coumadin for afib admitted 11/19/2012 with worsening SOB and LEE.  Pharmacy consulted to dose warfarin.  Home warfarin dose: 2.5 mg qMWF and 3.75 mg qTTSS  Assessment: Anticoagulation: Afib, INR at goal  (stable), CBC stable no bleeding noted  Infectious Disease: afeb, wbc wnl, no abx, but UA (+) evaluate for possible antibiotic intitation  Cardiovascular:diastolic CHF, EF 16-10% , OSA with secondary pulm HTN, permanent afib: clonidine 0.2 bid, Hydralazine 75 TID, Dilt 360, metop 25 bid, K 40 BID (K 4.9 >change to 20 daily).BP 124/67, HR 70-80s i/o 660/1225, CVP up, Admit Wt 103kg:    Current Weight 93.2kg  Endocrinology: CBGs below goal  Gastrointestinal / Nutrition: GERD. Heart healthy diet  Nephrology/Urology/Electrolytes: solo kidney, CKD, CrCl 20 (SCr trending down slightly diuretics)  Pulmonary: COPD, 100% on 2L [place],   PTA Medication Issues: furo 40 BID Best Practices: coumadin  Dispo >>SNF next week   Goal of Therapy:  INR 2-3  Plan:  Cont coumadin 3mg  daily. Daily PT/INR.  Thank you for allowing pharmacy to be a part of this patients care team.  Lovenia Kim  Pharm.D., BCPS Clinical Pharmacist 11/30/2012 7:19 AM Pager: [phone] Phone: [phone]

## 2012-11-30 NOTE — Interval H&P Note (Signed)
History and Physical Interval Note:  11/30/2012 3:19 PM  Desiree Pierce  has presented today for surgery, with the diagnosis of cp  The various methods of treatment have been discussed with the patient and family. After consideration of risks, benefits and other options for treatment, the patient has consented to  Procedure(s) (LRB) with comments: RIGHT HEART CATH (N/A) as a surgical intervention .  The patient's history has been reviewed, patient examined, no change in status, stable for surgery.  I have reviewed the patient's chart and labs.  Questions were answered to the patient's satisfaction.     Dal Blew

## 2012-11-30 NOTE — Progress Notes (Signed)
Advanced Heart Failure Rounding Note   Subjective:    Desiree Pierce is a 77yo female with PMHx s/f chronic diastolic CHF, congenital solitary kidney, CKD (stage IV, baseline Cr 1.5-2.4), COPD + OSA + morbid obesity ->secondary pulmonary HTN, permanent atrial fibrillation, OSA, GERD who presents to [facility] ED with shortness of breath.   She last followed up in CHF clinic in 05/2012. Baseline weight 195 lbs.  She lives alone and no longer received home health services. She is quite limited by arthritis, and is essentially wheelchair bound.  10/2011 echo: LVEF 55-60%, moderate LVH, mod-severe biatrial enlargement, mild MR, mod-severe TR, normal RV size and function.   Admitted with progressive SOB and worsening LE edema.  POC TnI 0.02. pBNP 7488.0 .CXR reveals cardiomegaly, small R pleural effusion, otherwise unremarkable. BMET reveals BUN 41, Cr 2.14. Admit weight 232 pounds.  11/19/12 Started on Lasix 80 mg IV bid. 11/21/12 Continue on Lasix 80 mg IV tid and  Metolalzone 5 mg daily.   11/22/12 ECHO EF 60% Moderate LVH Mild Mitral regurgitation. +RV dysfunction  Weight unchanged.  Lasix held yesterday due to increased Cr.  1.98>>>>2.46>2.39.   UA with +WBC, bacteria, leukocytes     CVP: 20 (personally checked). Feels better. Denies SOB, orthopnea or PND.   Objective:    Vital Signs:   Temp:  [96.6 F (35.9 C)-98.1 F (36.7 C)] 98 F (36.7 C) (12/10 1110) Pulse Rate:  [73-87] 87  (12/10 1110) Resp:  [17-19] 18  (12/10 1110) BP: (120-154)/(65-89) 154/89 mmHg (12/10 1110) SpO2:  [95 %-100 %] 100 % (12/10 1110) Weight:  [93.2 kg (205 lb 7.5 oz)] 93.2 kg (205 lb 7.5 oz) (12/10 0500) Last BM Date: 11/29/12  Weight change: Filed Weights   11/28/12 0400 11/29/12 0700 11/30/12 0500  Weight: 93.8 kg (206 lb 12.7 oz) 93.2 kg (205 lb 7.5 oz) 93.2 kg (205 lb 7.5 oz)    Intake/Output:   Intake/Output Summary (Last 24 hours) at 11/30/12 1520 Last data filed at 11/30/12 1500  Gross per 24 hour   Intake    350 ml  Output    901 ml  Net   -551 ml     Physical Exam:  General:  Lying in bed.  No resp difficulty HEENT: normal Neck: supple. JVP to jaw. Carotids 2+ bilat; no bruits. No lymphadenopathy or thryomegaly appreciated. Cor: PMI nondisplaced. Irregular rate & rhythm. No rubs, gallops or murmurs. Lungs: clear Abdomen: soft, nontender, nondistended. No hepatosplenomegaly. No bruits or masses. Good bowel sounds. Extremities: no cyanosis, clubbing, rash, R and LLE tr edema  Neuro: alert & orientedx3, cranial nerves grossly intact. moves all 4 extremities w/o difficulty. Affect pleasant  Telemetry: A fib 70-80s  Labs: Basic Metabolic Panel:  Lab 11/30/12 1610 11/29/12 0445 11/28/12 0435 11/27/12 0500 11/26/12 0600  NA 143 143 143 146* 146*  K 4.9 4.6 4.4 4.3 4.2  CL 100 98 98 98 99  CO2 38* 39* 39* 39* 39*  GLUCOSE 83 93 115* 89 82  BUN 63* 61* 58* 53* 50*  CREATININE 2.39* 2.46* 2.44* 2.19* 2.02*  CALCIUM 9.0 9.0 8.8 -- --  MG -- -- -- -- --  PHOS -- -- -- -- --    Liver Function Tests: No results found for this basename: AST:5,ALT:5,ALKPHOS:5,BILITOT:5,PROT:5,ALBUMIN:5 in the last 168 hours No results found for this basename: LIPASE:5,AMYLASE:5 in the last 168 hours No results found for this basename: AMMONIA:3 in the last 168 hours  CBC:  Lab 11/30/12 0500 11/29/12 0445  11/28/12 0435 11/27/12 0500 11/26/12 0600  WBC 5.1 4.8 5.2 5.8 5.4  NEUTROABS -- -- -- -- --  HGB 9.6* 9.8* 9.7* 10.1* 10.0*  HCT 33.2* 33.7* 33.1* 34.9* 34.8*  MCV 90.0 91.3 91.4 90.4 89.5  PLT 182 190 174 165 166    Cardiac Enzymes: No results found for this basename: CKTOTAL:5,CKMB:5,CKMBINDEX:5,TROPONINI:5 in the last 168 hours  BNP: BNP (last 3 results)  Basename 11/19/12 1655  PROBNP 7488.0*     Medications:     Scheduled Medications:    . [facility] HOLD] ciprofloxacin  250 mg Oral Q12H  . [facility] HOLD] cloNIDine  0.2 mg Oral BID  . [facility] HOLD] diltiazem  360 mg Oral Daily   . [MAR HOLD] hydrALAZINE  75 mg Oral Q8H  . [facility] HOLD] metoprolol tartrate  25 mg Oral BID  . [facility] HOLD] potassium chloride  20 mEq Oral Daily  . [MAR HOLD] senna-docusate  1 tablet Oral Daily  . [[facility] HOLD] sodium chloride  3 mL Intravenous Q12H  . [facility] HOLD] warfarin  3 mg Oral q1800  . [facility] HOLD] Warfarin - Pharmacist Dosing Inpatient   Does not apply q1800  . [DISCONTINUED] potassium chloride  40 mEq Oral BID    Infusions:    PRN Medications: [MAR HOLD] sodium chloride, [MAR HOLD] acetaminophen, [MAR HOLD] ALPRAZolam, [MAR HOLD] ondansetron (ZOFRAN) IV, [MAR HOLD] sodium chloride, [MAR HOLD] traMADol, [MAR HOLD] zolpidem   Assessment:   1. Acute on chronic diastolic CHF  2. Permanent atrial fibrillation  3. Morbid obesity  4. Chronic renal failure, stage 4       --solitary kidney  5. OSA  6. Physical debility/deconditioning - wheelchair bound  7. Noncompliance  8. Hypertension, poorly controlled 9. UTI, cx pending.   Plan/Discussion:    Renal function unchanged despite holding  lasix since Sunday morning.  With CVP climbing to 20 will plan for RHC this am to assess ?PH and hemodynamics.  Hold diuretics until after cath.    UTI per UA yesterday.  Start cipro and await blood cx.    Rate controlled and INR at goal.    Disposition: pending RHC.  Plan for d/c to [facility].      Length of Stay: 11  Daniel Bensimhon,MD 3:20 PM

## 2012-12-01 LAB — BASIC METABOLIC PANEL
BUN: 64 mg/dL — ABNORMAL HIGH (ref 6–23)
CO2: 34 mEq/L — ABNORMAL HIGH (ref 19–32)
GFR calc non Af Amer: 18 mL/min — ABNORMAL LOW (ref >90)
Glucose, Bld: 133 mg/dL — ABNORMAL HIGH (ref 70–99)
Potassium: 4.5 mEq/L (ref 3.5–5.1)
Sodium: 143 mEq/L (ref 135–145)

## 2012-12-01 LAB — CBC
HCT: 31.7 % — ABNORMAL LOW (ref 36.0–46.0)
Hemoglobin: 9.2 g/dL — ABNORMAL LOW (ref 12.0–15.0)
MCHC: 29 g/dL — ABNORMAL LOW (ref 30.0–36.0)
RBC: 3.55 MIL/uL — ABNORMAL LOW (ref 3.87–5.11)

## 2012-12-01 LAB — CARBOXYHEMOGLOBIN
Carboxyhemoglobin: 2.1 % — ABNORMAL HIGH (ref 0.5–1.5)
O2 Saturation: 87.4 %
Total hemoglobin: 10.2 g/dL — ABNORMAL LOW (ref 12.0–16.0)

## 2012-12-01 LAB — PROTIME-INR: INR: 2.65 — ABNORMAL HIGH (ref 0.00–1.49)

## 2012-12-01 NOTE — Progress Notes (Addendum)
ANTICOAGULATION CONSULT NOTE - Follow Up Consult  Pharmacy Consult for coumadin Indication: atrial fibrillation  No Known Allergies  Patient Measurements: Height: 5\' 2"  (157.5 cm) Weight: 210 lb 12.2 oz (95.6 kg) IBW/kg (Calculated) : 50.1   Vital Signs: Temp: 97.9 F (36.6 C) (12/11 1215) Temp src: Axillary (12/11 1215) BP: 137/74 mmHg (12/11 1215) Pulse Rate: 88  (12/11 1215)  Labs:  Basename 12/01/12 0410 11/30/12 0500 11/29/12 0445  HGB 9.2* 9.6* --  HCT 31.7* 33.2* 33.7*  PLT 184 182 190  APTT -- -- --  LABPROT 27.0* 25.3* 25.8*  INR 2.65* 2.43* 2.50*  HEPARINUNFRC -- -- --  CREATININE 2.42* 2.39* 2.46*  CKTOTAL -- -- --  CKMB -- -- --  TROPONINI -- -- --   Estimated Creatinine Clearance: 20.3 ml/min (by C-G formula based on Cr of 2.42).  Admit Complaint: Patient is a 78 y.o F on coumadin for afib admitted 11/19/2012 with worsening SOB and LEE.  Pharmacy consulted to dose warfarin.  Home warfarin dose: 2.5 mg qMWF and 3.75 mg qTTSS  INR at goal  (stable), CBC stable no bleeding noted  Goal of Therapy:  INR 2-3  Plan:  Cont coumadin 3mg  daily. Daily PT/INR.  Reece Leader, Pharm D 12/01/2012 2:38 PM

## 2012-12-01 NOTE — Clinical Social Work Note (Signed)
Clinical Social Worker reviewed chart and noticed update regarding discharge plans. CSW noticed consult for CIR placement and will await full evaluation by CIR MD for disposition of CIR vs SNF.   Rozetta Nunnery MSW, LCSWA (Covering [place]) [phone]

## 2012-12-01 NOTE — Progress Notes (Signed)
Physical Therapy Treatment Patient Details Name: Desiree Pierce MRN: [id number] DOB: [birth year] Today's Date: 12/01/2012 Time: 1478-2956 PT Time Calculation (min): 26 min  PT Assessment / Plan / Recommendation Comments on Treatment Session  Pt demonstrated progress with activity tolerance this morning though she was very stiff with initial ambulation. She ambulated 32' total with a seated rest halfway. Pt's safety decreases as she fatigues and she begins to shuffle feet and experience trembling of the UE's. Chair kept close throughout ambulation as she begins to fatigue after 25'. PT will continue to follow. Pt is very motivated to go home, request CIR consult.    Follow Up Recommendations  CIR     Does the patient have the potential to tolerate intense rehabilitation     Barriers to Discharge        Equipment Recommendations  None recommended by PT    Recommendations for Other Services Rehab consult  Frequency     Plan Frequency remains appropriate;Discharge plan needs to be updated    Precautions / Restrictions Precautions Precautions: Fall Restrictions Weight Bearing Restrictions: No   Pertinent Vitals/Pain C/o left knee arthritic pain, improved after sitting    Mobility  Bed Mobility Bed Mobility: Not assessed (pt on EOB after breakfast) Transfers Transfers: Sit to Stand;Stand to Sit Sit to Stand: 4: Min assist;1: +2 Total assist;From bed;From chair/3-in-1;With upper extremity assist Sit to Stand: Patient Percentage: 60% Stand to Sit: 4: Min assist;With armrests;To chair/3-in-1 Details for Transfer Assistance: performed from bed and chair. Pt took 2 attempts from bed and comments that "I always have to try twice in the morning" and has difficulty fully extending knees for standing. +2 assist given from lower chair but pt performed approx 60% with both transfers. vc's to control descent, better control second sit than first Ambulation/Gait Ambulation/Gait Assistance: 4:  Min assist Ambulation Distance (Feet): 64 Feet (32', seated rest, 32') Assistive device: Rolling walker Ambulation/Gait Assistance Details: Pt shuffled feet being unable to lift them from floor with initial ambulation but this improved after first 5', however, at end of ambulation she began having trouble lifting legs again due to fatigue. Pt with trembling of UE's as well. Gait Pattern: Shuffle;Trunk flexed;Decreased stride length Gait velocity: decreased General Gait Details: vc's for erect posture Stairs: No Wheelchair Mobility Wheelchair Mobility: No    Exercises     PT Diagnosis:    PT Problem List:   PT Treatment Interventions:     PT Goals Acute Rehab PT Goals PT Goal Formulation: With patient Time For Goal Achievement: 12/15/12 Potential to Achieve Goals: Good Pt will go Supine/Side to Sit: Independently;with HOB 0 degrees PT Goal: Supine/Side to Sit - Progress: Goal set today Pt will go Sit to Supine/Side: Independently;with HOB 0 degrees PT Goal: Sit to Supine/Side - Progress: Goal set today Pt will go Sit to Stand: with min assist PT Goal: Sit to Stand - Progress: Goal set today Pt will Transfer Bed to Chair/Chair to Bed: with min assist PT Transfer Goal: Bed to Chair/Chair to Bed - Progress: Goal set today Pt will Ambulate: 16 - 50 feet;with supervision;with rolling walker PT Goal: Ambulate - Progress: Goal set today  Visit Information  Last PT Received On: 12/01/12 Assistance Needed: +2 (for equip and chair)    Subjective Data  Subjective: If it weren't for the arthritis I could really walk Patient Stated Goal: To return home.   Cognition  Overall Cognitive Status: Appears within functional limits for tasks assessed/performed Arousal/Alertness: Awake/alert Orientation Level: Appears  intact for tasks assessed Behavior During Session: [facility] for tasks performed    Balance  Balance Balance Assessed: No  End of Session PT - End of Session Equipment Utilized  During Treatment: Gait belt;Oxygen Activity Tolerance: Patient limited by fatigue;Patient tolerated treatment well Patient left: in chair;with call bell/phone within reach Nurse Communication: Mobility status   GP   Lyanne Co, PT  Acute Rehab Services  [phone]   Lyanne Co 12/01/2012, 10:13 AM

## 2012-12-01 NOTE — Progress Notes (Signed)
Advanced Heart Failure Rounding Note   Subjective:    Desiree Pierce is a 78yo female with PMHx s/f chronic diastolic CHF, congenital solitary kidney, CKD (stage IV, baseline Cr 1.5-2.4), COPD + OSA + morbid obesity ->secondary pulmonary HTN, permanent atrial fibrillation, OSA, GERD who presents to [facility] ED with shortness of breath.   She last followed up in CHF clinic in 05/2012. Baseline weight 195 lbs.  She lives alone and no longer received home health services. She is quite limited by arthritis, and is essentially wheelchair bound.  10/2011 echo: LVEF 55-60%, moderate LVH, mod-severe biatrial enlargement, mild MR, mod-severe TR, normal RV size and function.   Admitted with progressive SOB and worsening LE edema.  POC TnI 0.02. pBNP 7488.0 .CXR reveals cardiomegaly, small R pleural effusion, otherwise unremarkable. BMET reveals BUN 41, Cr 2.14. Admit weight 232 pounds.  11/19/12 Started on Lasix 80 mg IV bid. 11/21/12 Continue on Lasix 80 mg IV tid and  Metolalzone 5 mg daily.   11/22/12 ECHO EF 60% Moderate LVH Mild Mitral regurgitation. +RV dysfunction  12/01/12: RHC RA = 21  RV = 80/11/19  PA = 88/34 (56)  PCW = 24 v = 35  Fick cardiac output/index = 4.9/2.5  PVR = 7.3 woods  FA sat = 96%  PA sat = 55%, 57%  Started on lasix gtt and milrinone after cath.  UOP sluggish, weight up and hypotensive.  Therefore, lasix gtt placed on hold this am and clonidine/toprol held.    Feels better.  Currently denies rest dyspnea, orthopnea, and PND.  Dyspnea with exertion.    CVP: 14-15 (personally checked).    Objective:    Vital Signs:   Temp:  [96.1 F (35.6 C)-98.1 F (36.7 C)] 98 F (36.7 C) (12/11 1551) Pulse Rate:  [36-92] 86  (12/11 1551) Resp:  [14-29] 22  (12/11 1551) BP: (75-140)/(38-79) 113/69 mmHg (12/11 1551) SpO2:  [92 %-100 %] 98 % (12/11 1551) Weight:  [93.5 kg (206 lb 2.1 oz)-95.6 kg (210 lb 12.2 oz)] 95.6 kg (210 lb 12.2 oz) (12/11 0619) Last BM Date: 11/29/12  Weight  change: Filed Weights   11/30/12 0500 11/30/12 2327 12/01/12 0619  Weight: 93.2 kg (205 lb 7.5 oz) 93.5 kg (206 lb 2.1 oz) 95.6 kg (210 lb 12.2 oz)    Intake/Output:   Intake/Output Summary (Last 24 hours) at 12/01/12 1630 Last data filed at 12/01/12 1500  Gross per 24 hour  Intake    556 ml  Output    910 ml  Net   -354 ml     Physical Exam:  General:  Lying in bed.  No resp difficulty HEENT: normal Neck: supple. JVP to jaw. Carotids 2+ bilat; no bruits. No lymphadenopathy or thryomegaly appreciated. Cor: PMI nondisplaced. Irregular rate & rhythm. No rubs, gallops or murmurs. Lungs: clear Abdomen: soft, nontender, nondistended. No hepatosplenomegaly. No bruits or masses. Good bowel sounds. Extremities: no cyanosis, clubbing, rash, R and LLE tr edema  Neuro: alert & orientedx3, cranial nerves grossly intact. moves all 4 extremities w/o difficulty. Affect pleasant  Telemetry: A fib 70-80s  Labs: Basic Metabolic Panel:  Lab 12/01/12 9562 11/30/12 0500 11/29/12 0445 11/28/12 0435 11/27/12 0500  NA 143 143 143 143 146*  K 4.5 4.9 4.6 4.4 4.3  CL 101 100 98 98 98  CO2 34* 38* 39* 39* 39*  GLUCOSE 133* 83 93 115* 89  BUN 64* 63* 61* 58* 53*  CREATININE 2.42* 2.39* 2.46* 2.44* 2.19*  CALCIUM 8.9 9.0  9.0 -- --  MG -- -- -- -- --  PHOS -- -- -- -- --    Liver Function Tests: No results found for this basename: AST:5,ALT:5,ALKPHOS:5,BILITOT:5,PROT:5,ALBUMIN:5 in the last 168 hours No results found for this basename: LIPASE:5,AMYLASE:5 in the last 168 hours No results found for this basename: AMMONIA:3 in the last 168 hours  CBC:  Lab 12/01/12 0410 11/30/12 0500 11/29/12 0445 11/28/12 0435 11/27/12 0500  WBC 6.2 5.1 4.8 5.2 5.8  NEUTROABS -- -- -- -- --  HGB 9.2* 9.6* 9.8* 9.7* 10.1*  HCT 31.7* 33.2* 33.7* 33.1* 34.9*  MCV 89.3 90.0 91.3 91.4 90.4  PLT 184 182 190 174 165    Cardiac Enzymes: No results found for this basename: CKTOTAL:5,CKMB:5,CKMBINDEX:5,TROPONINI:5  in the last 168 hours  BNP: BNP (last 3 results)  Basename 11/19/12 1655  PROBNP 7488.0*     Medications:     Scheduled Medications:    . ciprofloxacin  250 mg Oral Q12H  . diltiazem  360 mg Oral Daily  . hydrALAZINE  75 mg Oral Q8H  . potassium chloride  20 mEq Oral Daily  . senna-docusate  1 tablet Oral Daily  . sildenafil  20 mg Oral TID  . sodium chloride  3 mL Intravenous Q12H  . warfarin  3 mg Oral q1800  . Warfarin - Pharmacist Dosing Inpatient   Does not apply q1800  . [DISCONTINUED] cloNIDine  0.2 mg Oral BID  . [DISCONTINUED] metoprolol tartrate  25 mg Oral BID    Infusions:    . [DISCONTINUED] furosemide (LASIX) infusion Stopped (12/01/12 1046)  . [DISCONTINUED] milrinone Stopped (12/01/12 1527)    PRN Medications: sodium chloride, acetaminophen, ALPRAZolam, ondansetron (ZOFRAN) IV, sodium chloride, traMADol, zolpidem, [DISCONTINUED] acetaminophen, [DISCONTINUED] ondansetron (ZOFRAN) IV   Assessment:   1. Acute on chronic diastolic CHF  2. Permanent atrial fibrillation  3. Morbid obesity  4. Chronic renal failure, stage 4       --solitary kidney  5. OSA  6. Physical debility/deconditioning - wheelchair bound  7. Noncompliance  8. Hypertension, poorly controlled 9. UTI, cipro started 12/10  Plan/Discussion:    Difficult situation. Hemodynamics from cath showed severe PH with left and right sided components and preserved cardiac output.  She was placed on a trial of milrinone and sildenafil to see if diuresis could be facilitated.  Her UOP remains verysluggish. BP is low.  I suspect this is as dry as we will be able to get her.  Will stop lasix gtt, clonidine and metoprolol at this time.  Will start torsemide 40 mg daily and follow CVP. Continue revatio. We will see if we can try to diurese her further slowly over time.   UTI - continue cipro for total of 3 days (tomorrow).    Rate controlled and INR at goal.    Disposition: CIR consult pending  vs SNF at [facility].    Daniel Bensimhon,MD 4:31 PM    Length of Stay: 12

## 2012-12-01 NOTE — Consult Note (Signed)
Physical Medicine and Rehabilitation Consult Reason for Consult: Deconditioning Referring Physician:  Dr. Gala Romney   HPI: Desiree Pierce is a 78 y.o. female with PMHx s/f chronic diastolic CHF, congenital solitary kidney, CKD (stage IV, baseline Cr 1.5-2.4), COPD + OSA + morbid obesity ->secondary pulmonary HTN, permanent atrial fibrillation, OSA, GERD who presents to [facility] ED on 11/19/12 with shortness of breath. Baseline weight 195 lbs and weight at admission at 232 lbs. She lives alone and no longer received home health services. She is quite limited by arthritis, and is essentially wheelchair bound. She was treated with IV diuretics for acute on chronic CHF. Has had worsening of renal function with elevation CVP.  She underwent cardiac cath revealing severe pulmonary hypertension and was started on trial of milrinone and sildenafil with poor UOP and low BP. Was started on cipro for UTI. Therapies initiated and team recommending CIR for progressive therapies  ROS  Past Medical History  Diagnosis Date  . Morbid obesity   . Atrial fibrillation   . Arthritis   . Allergy history unknown   . Hypertension     Poorly controlled  . CHF (congestive heart failure)   . Pulmonary hypertension   . GERD (gastroesophageal reflux disease)   . COPD (chronic obstructive pulmonary disease)   . OA (osteoarthritis)    Past Surgical History  Procedure Date  . Total abdominal hysterectomy   . Total knee arthroplasty     Right knee  . Ankle fracture surgery     Right ankle repair   Family History  Problem Relation Age of Onset  . Allergies Mother   . Heart disease Mother   . Asthma Father   . Diabetes type II Maternal Grandmother    Social History:  reports that she quit smoking about 43 years ago. She does not have any smokeless tobacco history on file. She reports that she does not drink alcohol. Her drug history not on file.  Allergies: No Known Allergies  Medications Prior to Admission   Medication Sig Dispense Refill  . Casanthranol-Docusate Sodium 30-100 MG CAPS Take 1 capsule by mouth daily.        . cloNIDine (CATAPRES) 0.3 MG tablet Take 0.3 mg by mouth daily.       Marland Kitchen diltiazem (TIAZAC) 360 MG 24 hr capsule Take 360 mg by mouth daily.        . furosemide (LASIX) 40 MG tablet Take 1 tablet (40 mg total) by mouth 2 (two) times daily.  60 tablet  6  . metoprolol tartrate (LOPRESSOR) 25 MG tablet Take 25 mg by mouth 2 (two) times daily.       . Multiple Vitamin (MULTIVITAMIN) tablet Take 1 tablet by mouth daily.        . potassium chloride SA (K-DUR,KLOR-CON) 20 MEQ tablet Take 40 mEq by mouth 2 (two) times daily.      Marland Kitchen warfarin (COUMADIN) 2.5 MG tablet Take 2.5 mg by mouth daily. Patient takes 1 tablet (2.5 mg) every day except for Monday, Wednesday and Fridays she takes 1.5 tablets (3.75 mg)        Home: Home Living Lives With: Daughter (Son-in-law; (daughter in hospital - CVA)) Available Help at Discharge: Family;Available 24 hours/day (Family looking into aide) Type of Home: House Home Access: Stairs to enter [employer] of Steps: 1 Entrance Stairs-Rails: None Home Layout: One level Bathroom Shower/Tub: Health visitor: Standard Bathroom Accessibility: Yes How Accessible: Accessible via walker Home Adaptive Equipment: Bedside commode/3-in-1;Grab bars in  shower;Shower chair with back;Walker - four wheeled;Wheelchair - manual (Retail buyer)  Functional History: Prior Function Bath: Minimal Meal Prep: Maximal Light Housekeeping: Total Able to Take Stairs?: No Driving: No Vocation: Retired Comments: Patient's history not consistent.  States she needed assist to walk.  Then later stated she walked very short distances on own. Functional Status:  Mobility: Bed Mobility Bed Mobility: Not assessed (pt on EOB after breakfast) Supine to Sit: 3: Mod assist;HOB elevated (30 degrees) Sitting - Scoot to Edge of Bed: 4: Min  guard Transfers Transfers: Sit to Stand;Stand to Sit Sit to Stand: 4: Min assist;1: +2 Total assist;From bed;From chair/3-in-1;With upper extremity assist Sit to Stand: Patient Percentage: 60% Stand to Sit: 4: Min assist;With armrests;To chair/3-in-1 Ambulation/Gait Ambulation/Gait Assistance: 4: Min assist Ambulation/Gait: Patient Percentage: 50% Ambulation Distance (Feet): 64 Feet (32', seated rest, 32') Assistive device: Rolling walker Ambulation/Gait Assistance Details: Pt shuffled feet being unable to lift them from floor with initial ambulation but this improved after first 5', however, at end of ambulation she began having trouble lifting legs again due to fatigue. Pt with trembling of UE's as well. Gait Pattern: Shuffle;Trunk flexed;Decreased stride length Gait velocity: decreased General Gait Details: vc's for erect posture Stairs: No Wheelchair Mobility Wheelchair Mobility: No  ADL:    Cognition: Cognition Arousal/Alertness: Awake/alert Orientation Level: Oriented X4 Cognition Overall Cognitive Status: Appears within functional limits for tasks assessed/performed Area of Impairment: Safety/judgement Arousal/Alertness: Awake/alert Orientation Level: Appears intact for tasks assessed Behavior During Session: [facility] for tasks performed Safety/Judgement: Decreased safety judgement for tasks assessed  Blood pressure 113/69, pulse 86, temperature 98 F (36.7 C), temperature source Oral, resp. rate 22, height 5\' 2"  (1.575 m), weight 95.6 kg (210 lb 12.2 oz), SpO2 98.00%. Physical Exam  Results for orders placed during the hospital encounter of 11/19/12 (from the past 24 hour(s))  PROTIME-INR     Status: Abnormal   Collection Time   12/01/12  4:10 AM      Component Value Range   Prothrombin Time 27.0 (*) 11.6 - 15.2 seconds   INR 2.65 (*) 0.00 - 1.49  CBC     Status: Abnormal   Collection Time   12/01/12  4:10 AM      Component Value Range   WBC 6.2  4.0 - 10.5 K/uL    RBC 3.55 (*) 3.87 - 5.11 MIL/uL   Hemoglobin 9.2 (*) 12.0 - 15.0 g/dL   HCT 16.1 (*) 09.6 - 04.5 %   MCV 89.3  78.0 - 100.0 fL   MCH 25.9 (*) 26.0 - 34.0 pg   MCHC 29.0 (*) 30.0 - 36.0 g/dL   RDW 40.9 (*) 81.1 - 91.4 %   Platelets 184  150 - 400 K/uL  BASIC METABOLIC PANEL     Status: Abnormal   Collection Time   12/01/12  4:10 AM      Component Value Range   Sodium 143  135 - 145 mEq/L   Potassium 4.5  3.5 - 5.1 mEq/L   Chloride 101  96 - 112 mEq/L   CO2 34 (*) 19 - 32 mEq/L   Glucose, Bld 133 (*) 70 - 99 mg/dL   BUN 64 (*) 6 - 23 mg/dL   Creatinine, Ser 7.82 (*) 0.50 - 1.10 mg/dL   Calcium 8.9  8.4 - 95.6 mg/dL   GFR calc non Af Amer 18 (*) >90 mL/min   GFR calc Af Amer 21 (*) >90 mL/min  CARBOXYHEMOGLOBIN     Status: Abnormal  Collection Time   12/01/12  8:50 AM      Component Value Range   Total hemoglobin 10.2 (*) 12.0 - 16.0 g/dL   O2 Saturation 81.1     Carboxyhemoglobin 2.1 (*) 0.5 - 1.5 %   Methemoglobin 0.9  0.0 - 1.5 %   No results found.  Assessment/Plan: Diagnosis: Pleasant female with deconditioning related to CHF.   Comment: I spoke with her this am. She understands the benefits of inpatient rehab, but doesn't feel that she can tolerate the intensity. Furthermore, [place] and wants to go to SNF out that way, where she "has been accepted."  Ivory Broad, MD   12/01/2012

## 2012-12-01 NOTE — Progress Notes (Signed)
Patient blood pressure 90/43 morning dose of hydralazine held .PA Dunn aware. me

## 2012-12-01 NOTE — Progress Notes (Signed)
Pt c/o tremors/chills but pt is not cold. She states she has had these in the past. MD Bensimhon aware and blood cultures ordered. No other complaints, s/s from pt. Will cont to monitor. Desiree Pierce L

## 2012-12-01 NOTE — Progress Notes (Signed)
Rehab Admissions Coordinator Note:  Patient was screened by Trish Mage for appropriateness for an Inpatient Acute Rehab Consult.  At this time, we are recommending Inpatient Rehab consult.  I spoke with PT this am and she feels patient could benefit from inpatient rehab.    Trish Mage 12/01/2012, 10:25 AM  I can be reached at [phone].

## 2012-12-02 DIAGNOSIS — N179 Acute kidney failure, unspecified: Secondary | ICD-10-CM

## 2012-12-02 DIAGNOSIS — N189 Chronic kidney disease, unspecified: Secondary | ICD-10-CM

## 2012-12-02 LAB — GLUCOSE, CAPILLARY: Glucose-Capillary: 205 mg/dL — ABNORMAL HIGH (ref 70–99)

## 2012-12-02 LAB — BASIC METABOLIC PANEL
BUN: 68 mg/dL — ABNORMAL HIGH (ref 6–23)
Calcium: 9.2 mg/dL (ref 8.4–10.5)
Creatinine, Ser: 3.07 mg/dL — ABNORMAL HIGH (ref 0.50–1.10)
GFR calc Af Amer: 16 mL/min — ABNORMAL LOW (ref >90)

## 2012-12-02 LAB — CBC
HCT: 31.6 % — ABNORMAL LOW (ref 36.0–46.0)
MCH: 26.3 pg (ref 26.0–34.0)
MCV: 88.5 fL (ref 78.0–100.0)
Platelets: 188 10*3/uL (ref 150–400)
RDW: 16.7 % — ABNORMAL HIGH (ref 11.5–15.5)

## 2012-12-02 LAB — CARBOXYHEMOGLOBIN
Carboxyhemoglobin: 1.9 % — ABNORMAL HIGH (ref 0.5–1.5)
Methemoglobin: 1.3 % (ref 0.0–1.5)
Methemoglobin: 1 % (ref 0.0–1.5)
Total hemoglobin: 9.6 g/dL — ABNORMAL LOW (ref 12.0–16.0)
Total hemoglobin: 10.3 g/dL — ABNORMAL LOW (ref 12.0–16.0)

## 2012-12-02 LAB — URINALYSIS, ROUTINE W REFLEX MICROSCOPIC
Nitrite: NEGATIVE
Protein, ur: 100 mg/dL — AB
Specific Gravity, Urine: 1.017 (ref 1.005–1.030)
Urobilinogen, UA: 1 mg/dL (ref 0.0–1.0)

## 2012-12-02 LAB — URINE MICROSCOPIC-ADD ON

## 2012-12-02 MED ORDER — HYDRALAZINE HCL 50 MG PO TABS
50.0000 mg | ORAL_TABLET | Freq: Three times a day (TID) | ORAL | Status: DC
  Administered 2012-12-02 – 2012-12-03 (×3): 50 mg via ORAL
  Filled 2012-12-02 (×6): qty 1

## 2012-12-02 MED ORDER — CIPROFLOXACIN HCL 250 MG PO TABS
250.0000 mg | ORAL_TABLET | Freq: Every day | ORAL | Status: DC
  Administered 2012-12-02 – 2012-12-06 (×5): 250 mg via ORAL
  Filled 2012-12-02 (×6): qty 1

## 2012-12-02 MED ORDER — VANCOMYCIN HCL 1000 MG IV SOLR
750.0000 mg | INTRAVENOUS | Status: DC
  Administered 2012-12-04 – 2012-12-06 (×2): 750 mg via INTRAVENOUS
  Filled 2012-12-02 (×2): qty 750

## 2012-12-02 MED ORDER — VANCOMYCIN HCL 10 G IV SOLR
1250.0000 mg | Freq: Once | INTRAVENOUS | Status: AC
  Administered 2012-12-02: 1250 mg via INTRAVENOUS
  Filled 2012-12-02: qty 1250

## 2012-12-02 NOTE — Progress Notes (Signed)
Seen and agree with SPT note Brenleigh Collet Tabor Reese Stockman, PT [phone]  

## 2012-12-02 NOTE — Progress Notes (Signed)
ANTICOAGULATION CONSULT NOTE - Follow Up Consult  Pharmacy Consult for coumadin Indication: atrial fibrillation  No Known Allergies  Patient Measurements: Height: 5\' 2"  (157.5 cm) Weight: 210 lb 12.2 oz (95.6 kg) IBW/kg (Calculated) : 50.1   Vital Signs: Temp: 97.6 F (36.4 C) (12/12 0518) Temp src: Oral (12/12 0518) BP: 120/63 mmHg (12/12 0518) Pulse Rate: 83  (12/12 0518)  Labs:  Basename 12/02/12 0500 12/01/12 0410 11/30/12 0500  HGB 9.4* 9.2* --  HCT 31.6* 31.7* 33.2*  PLT 188 184 182  APTT -- -- --  LABPROT 32.9* 27.0* 25.3*  INR 3.47* 2.65* 2.43*  HEPARINUNFRC -- -- --  CREATININE 3.07* 2.42* 2.39*  CKTOTAL -- -- --  CKMB -- -- --  TROPONINI -- -- --   Estimated Creatinine Clearance: 16 ml/min (by C-G formula based on Cr of 3.07).  Admit Complaint: Patient is a 78 y.o F on coumadin for afib admitted 11/19/2012 with worsening SOB and LEE.  Pharmacy consulted to dose warfarin.  Home warfarin dose: 2.5 mg qMWF and 3.75 mg qTTSS  INR with steep rise today, CBC stable no bleeding noted.  Goal of Therapy:  INR 2-3  Plan:  Hold Coumadin today. Daily PT/INR.  Reece Leader, Pharm D 12/02/2012 7:53 AM

## 2012-12-02 NOTE — Progress Notes (Signed)
Note plan for SNF d/c. CIR to sign off.  [phone]

## 2012-12-02 NOTE — Progress Notes (Signed)
PA, [name] at bedside with family. Desiree Pierce L

## 2012-12-02 NOTE — Progress Notes (Signed)
Advanced Heart Failure Rounding Note   Subjective:    Desiree Pierce is a 78yo female with PMHx s/f chronic diastolic CHF, congenital solitary kidney, CKD (stage IV, baseline Cr 1.5-2.4), COPD + OSA + morbid obesity ->secondary pulmonary HTN, permanent atrial fibrillation, OSA, GERD who presents to [facility] ED with shortness of breath.   She last followed up in CHF clinic in 05/2012. Baseline weight 200-205 lbs.  She lives alone and no longer received home health services. She is quite limited by arthritis, and is essentially wheelchair bound.  10/2011 echo: LVEF 55-60%, moderate LVH, mod-severe biatrial enlargement, mild MR, mod-severe TR, normal RV size and function.   Admitted with progressive SOB and worsening LE edema.  POC TnI 0.02. pBNP 7488.0 .CXR reveals cardiomegaly, small R pleural effusion, otherwise unremarkable. BMET reveals BUN 41, Cr 2.14. Admit weight 232 pounds.  11/19/12 Started on Lasix 80 mg IV bid. 11/21/12 Continue on Lasix 80 mg IV tid and  Metolalzone 5 mg daily.   11/22/12 ECHO EF 60% Moderate LVH Mild Mitral regurgitation. +RV dysfunction  12/01/12: RHC RA = 21  RV = 80/11/19  PA = 88/34 (56)  PCW = 24 v = 35  Fick cardiac output/index = 4.9/2.5  PVR = 7.3 woods  FA sat = 96%  PA sat = 55%, 57%  Started on lasix gtt and milrinone after cath.  UOP sluggish, weight up and hypotensive.  Therefore, lasix, milrinone, clonidine/toprol discontinued 12/11.  Tremors/chills yesterday that improved , blood cx pending.    Cr 2.42>3.07.    Feels better.  Currently denies rest dyspnea, orthopnea, and PND.  Dyspnea with exertion.    CVP: 24-25 (personally checked).    Objective:    Vital Signs:   Temp:  [97.1 F (36.2 C)-98 F (36.7 C)] 97.1 F (36.2 C) (12/12 0800) Pulse Rate:  [36-98] 86  (12/12 0800) Resp:  [14-22] 18  (12/12 0800) BP: (75-140)/(38-79) 122/63 mmHg (12/12 0800) SpO2:  [97 %-100 %] 97 % (12/12 0800) Last BM Date: 11/29/12  Weight change: Filed  Weights   11/30/12 0500 11/30/12 2327 12/01/12 0619  Weight: 93.2 kg (205 lb 7.5 oz) 93.5 kg (206 lb 2.1 oz) 95.6 kg (210 lb 12.2 oz)    Intake/Output:   Intake/Output Summary (Last 24 hours) at 12/02/12 0945 Last data filed at 12/02/12 0900  Gross per 24 hour  Intake    442 ml  Output    770 ml  Net   -328 ml     Physical Exam:  General:  Lying in bed.  No resp difficulty HEENT: normal Neck: supple. JVP to jaw. Carotids 2+ bilat; no bruits. No lymphadenopathy or thryomegaly appreciated. Cor: PMI nondisplaced. Irregular rate & rhythm. No rubs, gallops or murmurs. Lungs: clear Abdomen: soft, nontender, nondistended. No hepatosplenomegaly. No bruits or masses. Good bowel sounds. Extremities: no cyanosis, clubbing, rash, +1 edema  Neuro: alert & orientedx3, cranial nerves grossly intact. moves all 4 extremities w/o difficulty. Affect pleasant  Telemetry: A fib 70-80s  Labs: Basic Metabolic Panel:  Lab 12/02/12 4696 12/01/12 0410 11/30/12 0500 11/29/12 0445 11/28/12 0435  NA 143 143 143 143 143  K 4.5 4.5 4.9 4.6 4.4  CL 102 101 100 98 98  CO2 31 34* 38* 39* 39*  GLUCOSE 87 133* 83 93 115*  BUN 68* 64* 63* 61* 58*  CREATININE 3.07* 2.42* 2.39* 2.46* 2.44*  CALCIUM 9.2 8.9 9.0 -- --  MG -- -- -- -- --  PHOS -- -- -- -- --  Liver Function Tests: No results found for this basename: AST:5,ALT:5,ALKPHOS:5,BILITOT:5,PROT:5,ALBUMIN:5 in the last 168 hours No results found for this basename: LIPASE:5,AMYLASE:5 in the last 168 hours No results found for this basename: AMMONIA:3 in the last 168 hours  CBC:  Lab 12/02/12 0500 12/01/12 0410 11/30/12 0500 11/29/12 0445 11/28/12 0435  WBC 6.9 6.2 5.1 4.8 5.2  NEUTROABS -- -- -- -- --  HGB 9.4* 9.2* 9.6* 9.8* 9.7*  HCT 31.6* 31.7* 33.2* 33.7* 33.1*  MCV 88.5 89.3 90.0 91.3 91.4  PLT 188 184 182 190 174    Cardiac Enzymes: No results found for this basename: CKTOTAL:5,CKMB:5,CKMBINDEX:5,TROPONINI:5 in the last 168  hours  BNP: BNP (last 3 results)  Basename 11/19/12 1655  PROBNP 7488.0*     Medications:     Scheduled Medications:    . ciprofloxacin  250 mg Oral Q breakfast  . diltiazem  360 mg Oral Daily  . hydrALAZINE  50 mg Oral Q8H  . potassium chloride  20 mEq Oral Daily  . senna-docusate  1 tablet Oral Daily  . sildenafil  20 mg Oral TID  . sodium chloride  3 mL Intravenous Q12H  . Warfarin - Pharmacist Dosing Inpatient   Does not apply q1800  . [DISCONTINUED] ciprofloxacin  250 mg Oral Q12H  . [DISCONTINUED] cloNIDine  0.2 mg Oral BID  . [DISCONTINUED] hydrALAZINE  75 mg Oral Q8H  . [DISCONTINUED] metoprolol tartrate  25 mg Oral BID  . [DISCONTINUED] warfarin  3 mg Oral q1800    Infusions:    . [DISCONTINUED] furosemide (LASIX) infusion Stopped (12/01/12 1046)  . [DISCONTINUED] milrinone Stopped (12/01/12 1527)    PRN Medications: sodium chloride, acetaminophen, ALPRAZolam, ondansetron (ZOFRAN) IV, sodium chloride, traMADol, zolpidem   Assessment:   1. Acute on chronic diastolic CHF  2. Permanent atrial fibrillation  3. Morbid obesity  4. Chronic renal failure, stage 4       --solitary kidney  5. OSA  6. Physical debility/deconditioning - wheelchair bound  7. Noncompliance  8. Hypertension, poorly controlled 9. UTI, cipro started 12/10  Plan/Discussion:    Renal function jumped in the setting of increased lasix and some mild hypotension yesterday  Difficult situation as her renal function is now getting worse in the setting of CVP 24-25 (got as low as 15). She also has a solitary kidney so need to be very careful.  Weight still down about 20 pounds from baseline. Will hold all diuretics. Recheck co-ox. If down would have low threshold to add low dose dopamine to see if this helps (as AF permits). With her restrictive physiology may not be able to maintain CVP much lower than 17 or 18. Continue Revatio. With solitary kidney I would be afraid to try UF at this  point.    UTI - continue cipro until foley is removed.  Awaiting blood cultures.      Rate controlled and INR at goal.    Disposition: SNF at [facility] as she has been turned down from CIR.    Length of Stay: 13  Miranda Garber,MD 9:50 AM

## 2012-12-02 NOTE — Progress Notes (Signed)
Physical Therapy Treatment Patient Details Name: Desiree Pierce MRN: [id number] DOB: [birth year] Today's Date: 12/02/2012 Time: 2841-3244 PT Time Calculation (min): 31 min  PT Assessment / Plan / Recommendation Comments on Treatment Session  Pt. admitted with CHF and edema in all extremities; Pt. having increased difficulty with ambulation today; pt. shaking and having increased pain. Pt. a is safety concern and had difficulty followng commands today with transfers and mobility.  SNF recommened at d/c secondary to pt. requesting to go to [facility]; her daughter is a pt. there.    Follow Up Recommendations  SNF           Equipment Recommendations  None recommended by PT       Frequency Min 3X/week   Plan Discharge plan needs to be updated;Frequency remains appropriate    Precautions / Restrictions Precautions Precautions: Fall Restrictions Weight Bearing Restrictions: No   Pertinent Vitals/Pain BP 120/63 (76) HR 91 O2 dropped to 88% on 1L; Pt. placed on 2L and O2 above 91% throughout    Mobility  Bed Mobility Supine to Sit: 4: Min guard;HOB elevated;With rails (30 degrees) Sitting - Scoot to Edge of Bed: 4: Min guard Details for Bed Mobility Assistance: Min gaurd for safety. Pt. improving with being able to grasp rails to assist with supine -> sit. Transfers Sit to Stand: 3: Mod assist;From bed;From chair/3-in-1 Stand to Sit: 3: Mod assist;To chair/3-in-1 Details for Transfer Assistance: Performed x2; At times pt. required +2 to stand secondary to patient pushing back when standing; cued to lean forward but pt. unable to follow command. Pt. required cueing for hand placement and sequencing. Once in standing pt. required time to get her feet adjusted and is very unsteady. Physical assist to control descent to chair. Ambulation/Gait Ambulation/Gait Assistance: 4: Min assist Ambulation Distance (Feet): 26 Feet Assistive device: Rolling walker Ambulation/Gait Assistance  Details: Pt. having increased difficulty with ambulation today. Pt. complaining of pain in the LLE and grabing for hand rails in hallway. Unable to follow cues to side step so chair could be placed behind pt. and pt. trying to sit without chair behind her.  Gait Pattern: Step-to pattern;Decreased stride length;Trunk flexed Gait velocity: decreased    Exercises General Exercises - Lower Extremity Long Arc Quad: AROM;Both;10 reps;Seated Hip Flexion/Marching: AROM;Both;10 reps;Seated     PT Goals Acute Rehab PT Goals PT Goal: Supine/Side to Sit - Progress: Progressing toward goal PT Goal: Sit to Stand - Progress: Progressing toward goal PT Goal: Ambulate - Progress: Progressing toward goal  Visit Information  Last PT Received On: 12/02/12 Assistance Needed: +2 (for chair and lines, transfers at times)    Subjective Data  Subjective: "I don't know what is causing me to shake."   Cognition  Overall Cognitive Status: Impaired Area of Impairment: Safety/judgement;Following commands Arousal/Alertness: Awake/alert Orientation Level: Appears intact for tasks assessed Behavior During Session: [facility] for tasks performed Following Commands: Follows multi-step commands inconsistently Safety/Judgement: Decreased safety judgement for tasks assessed       End of Session PT - End of Session Equipment Utilized During Treatment: Gait belt;Oxygen Activity Tolerance: Patient limited by pain;Patient limited by fatigue Patient left: in chair;with call bell/phone within reach Nurse Communication: Mobility status     Army Chaco SPT 12/02/2012, 9:25 AM

## 2012-12-02 NOTE — Progress Notes (Signed)
ANTIBIOTIC CONSULT NOTE - INITIAL  Pharmacy Consult for vancomycin  Indication: Positive blood culture  No Known Allergies  Patient Measurements: Height: 5\' 2"  (157.5 cm) Weight: 210 lb 12.2 oz (95.6 kg) IBW/kg (Calculated) : 50.1   Vital Signs: Temp: 97.6 F (36.4 C) (12/12 2005) Temp src: Oral (12/12 2005) BP: 113/42 mmHg (12/12 2005) Pulse Rate: 90  (12/12 2005) Intake/Output from previous day: 12/11 0701 - 12/12 0700 In: 576 [P.O.:240; I.V.:336] Out: 755 [Urine:755] Intake/Output from this shift: Total I/O In: 63 [I.V.:63] Out: -   Labs:  Basename 12/02/12 0500 12/01/12 0410 11/30/12 0500  WBC 6.9 6.2 5.1  HGB 9.4* 9.2* 9.6*  PLT 188 184 182  LABCREA -- -- --  CREATININE 3.07* 2.42* 2.39*   Estimated Creatinine Clearance: 16 ml/min (by C-G formula based on Cr of 3.07). No results found for this basename: VANCOTROUGH:2,VANCOPEAK:2,VANCORANDOM:2,GENTTROUGH:2,GENTPEAK:2,GENTRANDOM:2,TOBRATROUGH:2,TOBRAPEAK:2,TOBRARND:2,AMIKACINPEAK:2,AMIKACINTROU:2,AMIKACIN:2, in the last 72 hours   Microbiology: Recent Results (from the past 720 hour(s))  MRSA PCR SCREENING     Status: Abnormal   Collection Time   11/19/12 10:40 PM      Component Value Range Status Comment   MRSA by PCR POSITIVE (*) NEGATIVE Final   URINE CULTURE     Status: Normal   Collection Time   11/29/12 10:57 AM      Component Value Range Status Comment   Specimen Description URINE, RANDOM   Final    Special Requests NONE   Final    Culture  Setup Time 11/29/2012 12:16   Final    Colony Count >=100,000 COLONIES/ML   Final    Culture     Final    Value: Multiple bacterial morphotypes present, none predominant. Suggest appropriate recollection if clinically indicated.   Report Status 11/30/2012 FINAL   Final     Medical History: Past Medical History  Diagnosis Date  . Morbid obesity   . Atrial fibrillation   . Arthritis   . Allergy history unknown   . Hypertension     Poorly controlled  . CHF  (congestive heart failure)   . Pulmonary hypertension   . GERD (gastroesophageal reflux disease)   . COPD (chronic obstructive pulmonary disease)   . OA (osteoarthritis)     Medications:  Scheduled:    . ciprofloxacin  250 mg Oral Q breakfast  . diltiazem  360 mg Oral Daily  . hydrALAZINE  50 mg Oral Q8H  . potassium chloride  20 mEq Oral Daily  . senna-docusate  1 tablet Oral Daily  . sildenafil  20 mg Oral TID  . sodium chloride  3 mL Intravenous Q12H  . Warfarin - Pharmacist Dosing Inpatient   Does not apply q1800   Assessment: 79 yo female known to pharmacy from Coumadin management. Pharmacy now consulted to manage vancomycin for GPC isolated from blood culture per RN (lab results not updated yet Epic).   Goal of Therapy:  Vancomycin trough 15-20 mcg/mL  Plan:  1. Vancomycin 1250mg  IV x 1, then 750mg  IV Q48H.   Emeline Gins 12/02/2012,10:55 PM

## 2012-12-03 LAB — URINE CULTURE: Colony Count: >=100000

## 2012-12-03 LAB — BASIC METABOLIC PANEL
BUN: 71 mg/dL — ABNORMAL HIGH (ref 6–23)
Calcium: 9.5 mg/dL (ref 8.4–10.5)
Creatinine, Ser: 3.23 mg/dL — ABNORMAL HIGH (ref 0.50–1.10)
GFR calc Af Amer: 15 mL/min — ABNORMAL LOW (ref >90)
GFR calc non Af Amer: 13 mL/min — ABNORMAL LOW (ref >90)
Glucose, Bld: 118 mg/dL — ABNORMAL HIGH (ref 70–99)

## 2012-12-03 LAB — CBC
HCT: 32.5 % — ABNORMAL LOW (ref 36.0–46.0)
Hemoglobin: 9.5 g/dL — ABNORMAL LOW (ref 12.0–15.0)
MCH: 26.2 pg (ref 26.0–34.0)
MCHC: 29.2 g/dL — ABNORMAL LOW (ref 30.0–36.0)
MCV: 89.5 fL (ref 78.0–100.0)
RDW: 16.7 % — ABNORMAL HIGH (ref 11.5–15.5)

## 2012-12-03 LAB — PROTIME-INR: INR: 4.11 — ABNORMAL HIGH (ref 0.00–1.49)

## 2012-12-03 MED ORDER — MILRINONE IN DEXTROSE 20 MG/100ML IV SOLN
0.2500 ug/kg/min | INTRAVENOUS | Status: DC
  Administered 2012-12-03: 0.125 ug/kg/min via INTRAVENOUS
  Administered 2012-12-03 – 2012-12-04 (×2): 0.25 ug/kg/min via INTRAVENOUS
  Filled 2012-12-03 (×2): qty 100

## 2012-12-03 NOTE — Progress Notes (Signed)
ANTICOAGULATION CONSULT NOTE - Follow Up Consult  Pharmacy Consult for coumadin Indication: atrial fibrillation  No Known Allergies  Patient Measurements: Height: 5\' 2"  (157.5 cm) Weight: 214 lb 8.1 oz (97.3 kg) IBW/kg (Calculated) : 50.1   Vital Signs: Temp: 98 F (36.7 C) (12/13 0335) Temp src: Oral (12/13 0335) BP: 112/78 mmHg (12/13 0335) Pulse Rate: 90  (12/13 0335)  Labs:  Basename 12/03/12 0500 12/02/12 0500 12/01/12 0410  HGB 9.5* 9.4* --  HCT 32.5* 31.6* 31.7*  PLT 185 188 184  APTT -- -- --  LABPROT 37.3* 32.9* 27.0*  INR 4.11* 3.47* 2.65*  HEPARINUNFRC -- -- --  CREATININE 3.23* 3.07* 2.42*  CKTOTAL -- -- --  CKMB -- -- --  TROPONINI -- -- --   Estimated Creatinine Clearance: 15.4 ml/min (by C-G formula based on Cr of 3.23).  Admit Complaint: Patient is a 78 y.o F on coumadin for afib admitted 11/19/2012 with worsening SOB and LEE.  Pharmacy consulted to dose warfarin.  Home warfarin dose: 2.5 mg qMWF and 3.75 mg qTTSS  INR continues to rise today despite holding Coumadin yesterday, CBC stable, no bleeding noted.  Goal of Therapy:  INR 2-3  Plan:  Hold Coumadin today. Daily PT/INR.  Reece Leader, Pharm D 12/03/2012 8:26 AM

## 2012-12-03 NOTE — Progress Notes (Signed)
Updated [facility] SNF today as they have been anticipating admission there- advised them she is not stable as of yet for SNF transfer- they will continue to plan for her admit once stable- patient pleased as she will be in the same facility as her daughter- CSW to follow and assist. Reece Levy, MSW, Theresia Majors [phone]

## 2012-12-03 NOTE — Progress Notes (Signed)
Advanced Heart Failure Rounding Note   Subjective:    Desiree Pierce is a 78yo female with PMHx s/f chronic diastolic CHF, congenital solitary kidney, CKD (stage IV, baseline Cr 1.5-2.4), COPD + OSA + morbid obesity ->secondary pulmonary HTN, permanent atrial fibrillation, OSA, GERD who presents to [facility] ED with shortness of breath.   She last followed up in CHF clinic in 05/2012. Baseline weight 200-205 lbs.  She lives alone and no longer received home health services. She is quite limited by arthritis, and is essentially wheelchair bound.  10/2011 echo: LVEF 55-60%, moderate LVH, mod-severe biatrial enlargement, mild MR, mod-severe TR, normal RV size and function.   Admitted with progressive SOB and worsening LE edema.  POC TnI 0.02. pBNP 7488.0 .CXR reveals cardiomegaly, small R pleural effusion, otherwise unremarkable. BMET reveals BUN 41, Cr 2.14. Admit weight 232 pounds.  11/19/12 Started on Lasix 80 mg IV bid. 11/21/12 Continue on Lasix 80 mg IV tid and  Metolalzone 5 mg daily.   11/22/12 ECHO EF 60% Moderate LVH Mild Mitral regurgitation. +RV dysfunction  12/01/12: [facility] RA = 21  RV = 80/11/19  PA = 88/34 (56)  PCW = 24 v = 35  Fick cardiac output/index = 4.9/2.5  PVR = 7.3 woods  FA sat = 96%  PA sat = 55%, 57%  Started on lasix gtt and milrinone after cath.  UOP sluggish, weight up and hypotensive.  Therefore, lasix, milrinone, clonidine/toprol discontinued 12/11.  Tremors/chills yesterday off and on since cath.  Blood cx prelim +GPC.  Vanc started    Cr 2.42>3.07>3.23.    Feels better.  Currently denies rest dyspnea, orthopnea, and PND.  Dyspnea with exertion.    CVP: 21-23    Objective:    Vital Signs:   Temp:  [97 F (36.1 C)-98.1 F (36.7 C)] 98.1 F (36.7 C) (12/13 0823) Pulse Rate:  [84-90] 86  (12/13 0823) Resp:  [20-27] 27  (12/13 0823) BP: (110-145)/(42-94) 115/80 mmHg (12/13 0823) SpO2:  [94 %-95 %] 95 % (12/13 0823) Weight:  [214 lb 8.1 oz (97.3 kg)] 214 lb  8.1 oz (97.3 kg) (12/12 2332) Last BM Date: 11/29/12  Weight change: Filed Weights   11/30/12 2327 12/01/12 0619 12/02/12 2332  Weight: 206 lb 2.1 oz (93.5 kg) 210 lb 12.2 oz (95.6 kg) 214 lb 8.1 oz (97.3 kg)    Intake/Output:   Intake/Output Summary (Last 24 hours) at 12/03/12 0901 Last data filed at 12/03/12 0600  Gross per 24 hour  Intake    543 ml  Output    175 ml  Net    368 ml     Physical Exam:  General:  Lying in bed.  No resp difficulty HEENT: normal Neck: supple. JVP to jaw. Carotids 2+ bilat; no bruits. No lymphadenopathy or thryomegaly appreciated. Cor: PMI nondisplaced. Irregular rate & rhythm. No rubs, gallops or murmurs. Lungs: clear Abdomen: soft, nontender, nondistended. No hepatosplenomegaly. No bruits or masses. Good bowel sounds. Extremities: no cyanosis, clubbing, rash, +1 edema  Neuro: alert & orientedx3, cranial nerves grossly intact. moves all 4 extremities w/o difficulty. Affect pleasant. tremors  Telemetry: A fib 70-80s  Labs: Basic Metabolic Panel:  Lab 12/03/12 9629 12/02/12 0500 12/01/12 0410 11/30/12 0500 11/29/12 0445  NA 143 143 143 143 143  K 4.8 4.5 4.5 4.9 4.6  CL 100 102 101 100 98  CO2 30 31 34* 38* 39*  GLUCOSE 118* 87 133* 83 93  BUN 71* 68* 64* 63* 61*  CREATININE 3.23*  3.07* 2.42* 2.39* 2.46*  CALCIUM 9.5 9.2 8.9 -- --  MG -- -- -- -- --  PHOS -- -- -- -- --    Liver Function Tests: No results found for this basename: AST:5,ALT:5,ALKPHOS:5,BILITOT:5,PROT:5,ALBUMIN:5 in the last 168 hours No results found for this basename: LIPASE:5,AMYLASE:5 in the last 168 hours No results found for this basename: AMMONIA:3 in the last 168 hours  CBC:  Lab 12/03/12 0500 12/02/12 0500 12/01/12 0410 11/30/12 0500 11/29/12 0445  WBC 7.2 6.9 6.2 5.1 4.8  NEUTROABS -- -- -- -- --  HGB 9.5* 9.4* 9.2* 9.6* 9.8*  HCT 32.5* 31.6* 31.7* 33.2* 33.7*  MCV 89.5 88.5 89.3 90.0 91.3  PLT 185 188 184 182 190    Cardiac Enzymes: No results  found for this basename: CKTOTAL:5,CKMB:5,CKMBINDEX:5,TROPONINI:5 in the last 168 hours  BNP: BNP (last 3 results)  Basename 11/19/12 1655  PROBNP 7488.0*     Medications:     Scheduled Medications:    . ciprofloxacin  250 mg Oral Q breakfast  . diltiazem  360 mg Oral Daily  . potassium chloride  20 mEq Oral Daily  . senna-docusate  1 tablet Oral Daily  . sodium chloride  3 mL Intravenous Q12H  . vancomycin  750 mg Intravenous Q48H  . Warfarin - Pharmacist Dosing Inpatient   Does not apply q1800    Infusions:    . milrinone      PRN Medications: sodium chloride, acetaminophen, ALPRAZolam, ondansetron (ZOFRAN) IV, sodium chloride, traMADol, zolpidem   Assessment:   1. Acute on chronic diastolic CHF  2. Permanent atrial fibrillation  3. Morbid obesity  4. Chronic renal failure, stage 4       --solitary kidney  5. OSA  6. Physical debility/deconditioning - wheelchair bound  7. Noncompliance  8. Hypertension, poorly controlled 9. UTI, cipro started 12/10  Plan/Discussion:    Renal function continues to climb despite holding lasix.  Hypotension has improved.  CVP remains elevated.   Tremors continue.  Today I am going to adjust her regimen.  I will stop Revatio as tremors began after it was started.  I do not see where this is a commonly recognized side effect but it is temporally associated.  I am also going to stop hydralazine given low BP yesterday.  When she is off Revatio and hydralazine, I will start her back on milrinone 0.125 to be titrated to 0.25 to help with PA pressure/RV function.  If creatinine stabilizes on this regimen, will add back IV Lasix tomorrow.  She is volume overloaded but need to find an adequate regimen with which to diurese her.   UTI - continue cipro until foley is removed.  Continue vancomycin for GPCs in blood until speciation proves this a contaminant or not.      Rate controlled and INR at goal.    Disposition: SNF at [facility] as  she has been turned down from [facility].    Length of Stay: 14  Marca Ancona 12/03/2012 9:03 AM

## 2012-12-03 NOTE — Progress Notes (Signed)
Dr. Shirlee Latch notified of pt's blood culture being positive.

## 2012-12-04 DIAGNOSIS — N184 Chronic kidney disease, stage 4 (severe): Secondary | ICD-10-CM

## 2012-12-04 DIAGNOSIS — N179 Acute kidney failure, unspecified: Secondary | ICD-10-CM

## 2012-12-04 LAB — CBC
HCT: 28.9 % — ABNORMAL LOW (ref 36.0–46.0)
MCH: 26.5 pg (ref 26.0–34.0)
MCV: 88.1 fL (ref 78.0–100.0)
Platelets: 180 10*3/uL (ref 150–400)
RDW: 17 % — ABNORMAL HIGH (ref 11.5–15.5)
WBC: 8.2 10*3/uL (ref 4.0–10.5)

## 2012-12-04 LAB — BASIC METABOLIC PANEL
BUN: 76 mg/dL — ABNORMAL HIGH (ref 6–23)
Calcium: 9.5 mg/dL (ref 8.4–10.5)
Chloride: 100 mEq/L (ref 96–112)
Creatinine, Ser: 3.77 mg/dL — ABNORMAL HIGH (ref 0.50–1.10)
GFR calc Af Amer: 12 mL/min — ABNORMAL LOW (ref >90)

## 2012-12-04 LAB — CULTURE, BLOOD (ROUTINE X 2)

## 2012-12-04 MED ORDER — DOPAMINE-DEXTROSE 3.2-5 MG/ML-% IV SOLN
2.5000 ug/kg/min | INTRAVENOUS | Status: DC
  Administered 2012-12-04 – 2012-12-09 (×3): 2.5 ug/kg/min via INTRAVENOUS
  Filled 2012-12-04 (×4): qty 250

## 2012-12-04 MED ORDER — MORPHINE SULFATE 2 MG/ML IJ SOLN
2.0000 mg | Freq: Once | INTRAMUSCULAR | Status: AC
  Administered 2012-12-04: 2 mg via INTRAVENOUS
  Filled 2012-12-04: qty 1

## 2012-12-04 MED ORDER — FUROSEMIDE 10 MG/ML IJ SOLN
80.0000 mg | Freq: Once | INTRAMUSCULAR | Status: AC
  Administered 2012-12-04: 80 mg via INTRAVENOUS
  Filled 2012-12-04: qty 8

## 2012-12-04 NOTE — Progress Notes (Signed)
Primary cardiologist: Dr. [name]  Subjective:   More uncomfortable today, short of breath. No chest pain or palpitations.   Objective:   Temp:  [97.3 F (36.3 C)-97.8 F (36.6 C)] 97.4 F (36.3 C) (12/14 0752) Pulse Rate:  [79-95] 88  (12/14 0828) Resp:  [18-27] 25  (12/14 0828) BP: (103-128)/(51-89) 106/51 mmHg (12/14 0752) SpO2:  [92 %-98 %] 95 % (12/14 0828) Weight:  [218 lb 14.7 oz (99.3 kg)] 218 lb 14.7 oz (99.3 kg) (12/14 0500) Last BM Date: 11/29/12  Filed Weights   12/01/12 0619 12/02/12 2332 12/04/12 0500  Weight: 210 lb 12.2 oz (95.6 kg) 214 lb 8.1 oz (97.3 kg) 218 lb 14.7 oz (99.3 kg)    Intake/Output Summary (Last 24 hours) at 12/04/12 1000 Last data filed at 12/04/12 0600  Gross per 24 hour  Intake 672.34 ml  Output     75 ml  Net 597.34 ml   Telemetry: Atrial fibrillation with occasional PVCs.  Exam:  General: Obese woman, no acute distress but short of breath.  HEENT: Elevated JVP, no bruits.  Lungs: Decreased with upper airway congestion.  Cardiac: Irregularly irregular, no gallop.  Abdomen: , Nontender.  Extremities: 1-2+ edema.  Lab Results:  Basic Metabolic Panel:  Lab 12/04/12 1610 12/03/12 0500 12/02/12 0500  NA 143 143 143  K 4.6 4.8 4.5  CL 100 100 102  CO2 30 30 31   GLUCOSE 109* 118* 87  BUN 76* 71* 68*  CREATININE 3.77* 3.23* 3.07*  CALCIUM 9.5 9.5 9.2  MG -- -- --    CBC:  Lab 12/04/12 0454 12/03/12 0500 12/02/12 0500  WBC 8.2 7.2 6.9  HGB 8.7* 9.5* 9.4*  HCT 28.9* 32.5* 31.6*  MCV 88.1 89.5 88.5  PLT 180 185 188    Coagulation:  Lab 12/04/12 0454 12/03/12 0500 12/02/12 0500  INR 4.93* 4.11* 3.47*     Medications:   Scheduled Medications:    . ciprofloxacin  250 mg Oral Q breakfast  . diltiazem  360 mg Oral Daily  . senna-docusate  1 tablet Oral Daily  . sodium chloride  3 mL Intravenous Q12H  . vancomycin  750 mg Intravenous Q48H  . Warfarin - Pharmacist Dosing Inpatient   Does not apply  q1800     Infusions:    . milrinone 0.25 mcg/kg/min (12/04/12 0214)     PRN Medications:  sodium chloride, acetaminophen, ALPRAZolam, ondansetron (ZOFRAN) IV, sodium chloride, traMADol, zolpidem   Assessment:   1. Acute on chronic diastolic heart failure. LVEF 60% with moderate LVH.  2. Severe pulmonary hypertension, PA systolic 76 mmHg by most recent echocardiogram. Revatio stopped recently by Dr. Shirlee Latch secondary to tremors. Recent hemodynamics noted per [facility].  3. Permanent atrial fibrillation.  4. Chronic kidney disease, stage IV with solitary kidney. Renal function is worsening, creatinine up to 3.7.   5. Obstructive sleep apnea.  6. Hypertension.  7. Urinary tract infection, on Cipro. Also on vancomycin related to [facility] in clusters with no growth on blood cultures so far. Possibly contaminant.  8. Deconditioning, wheelchair-bound. Ultimately plan for SNF at [facility].   Plan/Discussion:    Creatinine has increased from 3.2 up to 3.7. Medications adjusted by Dr. Shirlee Latch yesterday with addition of milrinone. Lasix still on hold at this time. Overall systolic pressure stable. Weight continues to climb, 210 lbs to 218 lbs since 12/10. Spoke with Dr. Shirlee Latch - we will stop milrinone and initiate renal dose dopamine. Follow renal function and urine output. May  be forced to resume at least some dose of Lasix tomorrow in light of her volume overload, however would prefer to see her kidney function stabilize first.   Jonelle Sidle, M.D., F.A.C.C.

## 2012-12-04 NOTE — Progress Notes (Signed)
ANTICOAGULATION CONSULT NOTE - Follow Up Consult  Pharmacy Consult for coumadin Indication: atrial fibrillation  No Known Allergies  Patient Measurements: Height: 5\' 2"  (157.5 cm) Weight: 218 lb 14.7 oz (99.3 kg) IBW/kg (Calculated) : 50.1   Vital Signs: Temp: 97.1 F (36.2 C) (12/14 1156) Temp src: Axillary (12/14 1156) BP: 100/49 mmHg (12/14 1156) Pulse Rate: 91  (12/14 1156)  Labs:  Basename 12/04/12 0454 12/03/12 0500 12/02/12 0500  HGB 8.7* 9.5* --  HCT 28.9* 32.5* 31.6*  PLT 180 185 188  APTT -- -- --  LABPROT 42.7* 37.3* 32.9*  INR 4.93* 4.11* 3.47*  HEPARINUNFRC -- -- --  CREATININE 3.77* 3.23* 3.07*  CKTOTAL -- -- --  CKMB -- -- --  TROPONINI -- -- --   Estimated Creatinine Clearance: 13.3 ml/min (by C-G formula based on Cr of 3.77).  Admit Complaint: Patient is a 78 y.o F on coumadin for afib admitted 11/19/2012 with worsening SOB and LEE.  Pharmacy consulted to dose warfarin. Cardiologist notes today that patient is more uncomfortable today, short of breath. No chest pain or palpitations. SCR increased to 3.7 from 3.2. Weight increased.  INR = 4.93 today. INR continues to rise despite holding Coumadin . Last dose of coumadin given was 3mg  on 12/01/12. Hgb down to 8.7, pltc 180K. No bleeding noted.  Home warfarin dose: 2.5 mg qMWF and 3.75 mg qTTSS  Goal of Therapy:  INR 2-3  Plan:  Hold Coumadin today. Daily PT/INR.  Noah Delaine, RPh Clinical Pharmacist Pager: [phone] 12/04/2012 12:24 PM

## 2012-12-04 NOTE — Progress Notes (Signed)
Received call from RN caring for patient that she is drooling and having thick speech. She has been seen by MD this am and it was felt that Ambien given last night may have caused. She is awake and alert, no neuro deficits. I have discontinued Ambien and have asked the nurse to continue to monitor this patient for signs of declining mental status or neuro status.

## 2012-12-04 NOTE — Progress Notes (Signed)
Spoke with Dr. Terressa Koyanagi regarding pt low urine output (only 10mL recorded since 12/12).  Orders received to flush foley and bladder scan in order to rule out obstruction.  Will document results and continue to monitor.  Salomon Mast, RN

## 2012-12-04 NOTE — Progress Notes (Signed)
Spoke with Joni Reining, NP regarding pt status.  Pt seems very lethargic today; has been sleeping all day, not eating meals d/t lethargy.  Pt will arouse and is oriented x4.  Pt typically has clear speech; however pt sounding very gargly and hard to understand.  Also made NP aware that pt received ambien last night for sleep.  Orders received to d/c ambien and continue to monitor.  Salomon Mast, RN

## 2012-12-04 NOTE — Progress Notes (Signed)
Pt had 67mL when bladder scanned; I flushed catheter with 30mL sterile water and when I emptied the foley I got a total of 50mL out (20 mL from pt).  Will continue to monitor.  Salomon Mast, RN

## 2012-12-05 ENCOUNTER — Inpatient Hospital Stay (HOSPITAL_COMMUNITY): Payer: Medicare Other

## 2012-12-05 LAB — CBC
Hemoglobin: 8.3 g/dL — ABNORMAL LOW (ref 12.0–15.0)
MCH: 26.1 pg (ref 26.0–34.0)
MCHC: 29.7 g/dL — ABNORMAL LOW (ref 30.0–36.0)
MCV: 87.7 fL (ref 78.0–100.0)
Platelets: 182 10*3/uL (ref 150–400)
RBC: 3.18 MIL/uL — ABNORMAL LOW (ref 3.87–5.11)

## 2012-12-05 LAB — BLOOD GAS, ARTERIAL
Acid-Base Excess: 1.2 mmol/L (ref 0.0–2.0)
Bicarbonate: 26.7 mEq/L — ABNORMAL HIGH (ref 20.0–24.0)
O2 Content: 3 L/min
O2 Saturation: 87.8 %
pO2, Arterial: 57.3 mmHg — ABNORMAL LOW (ref 80.0–100.0)

## 2012-12-05 LAB — BASIC METABOLIC PANEL
CO2: 27 mEq/L (ref 19–32)
Calcium: 9.3 mg/dL (ref 8.4–10.5)
Creatinine, Ser: 4.38 mg/dL — ABNORMAL HIGH (ref 0.50–1.10)
GFR calc non Af Amer: 9 mL/min — ABNORMAL LOW (ref >90)
Glucose, Bld: 114 mg/dL — ABNORMAL HIGH (ref 70–99)

## 2012-12-05 MED ORDER — BIOTENE DRY MOUTH MT LIQD
15.0000 mL | Freq: Two times a day (BID) | OROMUCOSAL | Status: DC
  Administered 2012-12-05 – 2012-12-19 (×22): 15 mL via OROMUCOSAL

## 2012-12-05 MED ORDER — SODIUM POLYSTYRENE SULFONATE 15 GM/60ML PO SUSP
15.0000 g | Freq: Once | ORAL | Status: AC
  Administered 2012-12-05: 15 g via ORAL
  Filled 2012-12-05: qty 60

## 2012-12-05 MED ORDER — SODIUM CHLORIDE 0.45 % IV SOLN
INTRAVENOUS | Status: DC
  Administered 2012-12-05: 1000 mL via INTRAVENOUS
  Administered 2012-12-06 – 2012-12-13 (×3): via INTRAVENOUS

## 2012-12-05 NOTE — Progress Notes (Signed)
CRITICAL VALUE ALERT  Critical value received:  INR 5.2  Date of notification:  12/05/2012   Time of notification:  5:41 AM  Critical value read back: yes  Nurse who received alert:  Birdena Crandall  MD notified (1st page):    Time of first page:  0542  MD notified (2nd page):  Time of second page:  Responding MD:  Dr. Terressa Koyanagi  Time MD responded: [phone]

## 2012-12-05 NOTE — Progress Notes (Signed)
ANTICOAGULATION CONSULT NOTE - Follow Up Consult  Pharmacy Consult for Coumadin Indication: atrial fibrillation  No Known Allergies  Patient Measurements: Height: 5\' 2"  (157.5 cm) Weight: 212 lb 1.3 oz (96.2 kg) IBW/kg (Calculated) : 50.1   Vital Signs: Temp: 97.8 F (36.6 C) (12/15 1100) Temp src: Axillary (12/15 1100) BP: 121/60 mmHg (12/15 1100) Pulse Rate: 83  (12/15 1100)  Labs:  Basename 12/05/12 0449 12/04/12 0454 12/03/12 0500  HGB 8.3* 8.7* --  HCT 27.9* 28.9* 32.5*  PLT 182 180 185  APTT -- -- --  LABPROT 44.2* 42.7* 37.3*  INR 5.20* 4.93* 4.11*  HEPARINUNFRC -- -- --  CREATININE 4.38* 3.77* 3.23*  CKTOTAL -- -- --  CKMB -- -- --  TROPONINI -- -- --   Estimated Creatinine Clearance: 11.3 ml/min (by C-G formula based on Cr of 4.38).  Admit Complaint: Patient is a 78 y.o F on coumadin for afib admitted 11/19/2012 with worsening SOB and LEE.  INR = 5.2 today. INR continues to rise despite holding Coumadin . Last dose of coumadin given was 3mg  on 12/01/12. Hgb down to 8.3, pltc 182K. No bleeding noted.  Home warfarin dose: 2.5 mg qMWF and 3.75 mg qTTSS  Goal of Therapy:  INR 2-3  Plan:  Hold Coumadin today. Daily PT/INR.  Estella Husk, Pharm.D., BCPS Clinical Pharmacist  Phone [phone] Pager [phone] 12/05/2012, 2:20 PM

## 2012-12-05 NOTE — Consult Note (Addendum)
HPI: Desiree Pierce is a 78 y.o. female with PMHx s/f chronic diastolic CHF, congenital solitary kidney, CKD (stage IV, baseline Cr about 2mg /dl), COPD + OSA + morbid obesity, secondary pulmonary HTN, permanent atrial fibrillation, OSA, GERD who presents to [facility] ED on 11/19/12 with shortness of breath. Baseline weight in June 195 lbs and weight at admission at 232 lbs.  She was treated with IV diuretics for acute on chronic CHF. Has had worsening of renal function with elevation CVP. She underwent cardiac cath revealing severe pulmonary hypertension, and PCWP of 24 and was started on trial of milrinone and sildenafil with poor UOP and low BP.   We are asked to see now due to rising serum creatinine to 4.38.  Diuretics have been held. I s and O s reveal -52841 ccs since admission.  Current UOP has diminished to 250 cc last 24 hrs and 75 cc recorded today.   Past Medical History  Diagnosis Date  . Morbid obesity   . Atrial fibrillation   . Arthritis   . Allergy history unknown   . Hypertension     Poorly controlled  . CHF (congestive heart failure)   . Pulmonary hypertension   . GERD (gastroesophageal reflux disease)   . COPD (chronic obstructive pulmonary disease)   . OA (osteoarthritis)    Past Surgical History  Procedure Date  . Total abdominal hysterectomy   . Total knee arthroplasty     Right knee  . Ankle fracture surgery     Right ankle repair   Social History:  reports that she quit smoking about 43 years ago. She does not have any smokeless tobacco history on file. She reports that she does not drink alcohol. Her drug history not on file. Allergies: No Known Allergies Family History  Problem Relation Age of Onset  . Allergies Mother   . Heart disease Mother   . Asthma Father   . Diabetes type II Maternal Grandmother    Medications:  Scheduled:   . antiseptic oral rinse  15 mL Mouth Rinse BID  . ciprofloxacin  250 mg Oral Q breakfast  . diltiazem  360 mg Oral Daily  .  senna-docusate  1 tablet Oral Daily  . sodium chloride  3 mL Intravenous Q12H  . vancomycin  750 mg Intravenous Q48H  . Warfarin - Pharmacist Dosing Inpatient   Does not apply q1800   Continuous:   . DOPamine 2.524 mcg/kg/min (12/04/12 1900)    ROS: not able to obtain due to patient distress Blood pressure 121/60, pulse 83, temperature 97.8 F (36.6 C), temperature source Axillary, resp. rate 19, height 5\' 2"  (1.575 m), weight 96.2 kg (212 lb 1.3 oz), SpO2 95.00%.  General appearance: mild distress and moderate distress Head: Normocephalic, without obvious abnormality, atraumatic Eyes: negative Ears: neg Nose: Nares normal. Septum midline. Mucosa normal. No drainage or sinus tenderness. Throat: lips, mucosa, and tongue normal; teeth and gums normal Resp: rhonchorous sounds Chest wall: no tenderness GI: soft, non-tender; bowel sounds normal; no masses,  no organomegaly Extremities: edema 1+ chronic Skin: Skin color, texture, turgor normal. No rashes or lesions Neurologic: Grossly normal Results for orders placed during the hospital encounter of 11/19/12 (from the past 48 hour(s))  PROTIME-INR     Status: Abnormal   Collection Time   12/04/12  4:54 AM      Component Value Range Comment   Prothrombin Time 42.7 (*) 11.6 - 15.2 seconds    INR 4.93 (*) 0.00 - 1.49  CBC     Status: Abnormal   Collection Time   12/04/12  4:54 AM      Component Value Range Comment   WBC 8.2  4.0 - 10.5 K/uL    RBC 3.28 (*) 3.87 - 5.11 MIL/uL    Hemoglobin 8.7 (*) 12.0 - 15.0 g/dL    HCT 78.2 (*) 95.6 - 46.0 %    MCV 88.1  78.0 - 100.0 fL    MCH 26.5  26.0 - 34.0 pg    MCHC 30.1  30.0 - 36.0 g/dL    RDW 21.3 (*) 08.6 - 15.5 %    Platelets 180  150 - 400 K/uL   BASIC METABOLIC PANEL     Status: Abnormal   Collection Time   12/04/12  4:54 AM      Component Value Range Comment   Sodium 143  135 - 145 mEq/L    Potassium 4.6  3.5 - 5.1 mEq/L    Chloride 100  96 - 112 mEq/L    CO2 30  19 - 32  mEq/L    Glucose, Bld 109 (*) 70 - 99 mg/dL    BUN 76 (*) 6 - 23 mg/dL    Creatinine, Ser 5.78 (*) 0.50 - 1.10 mg/dL    Calcium 9.5  8.4 - 46.9 mg/dL    GFR calc non Af Amer 10 (*) >90 mL/min    GFR calc Af Amer 12 (*) >90 mL/min   BLOOD GAS, ARTERIAL     Status: Abnormal   Collection Time   12/05/12 12:45 AM      Component Value Range Comment   O2 Content 3.0      Delivery systems NASAL CANNULA      pH, Arterial 7.322 (*) 7.350 - 7.450    pCO2 arterial 53.0 (*) 35.0 - 45.0 mmHg    pO2, Arterial 57.3 (*) 80.0 - 100.0 mmHg    Bicarbonate 26.7 (*) 20.0 - 24.0 mEq/L    TCO2 28.3  0 - 100 mmol/L    Acid-Base Excess 1.2  0.0 - 2.0 mmol/L    O2 Saturation 87.8      Patient temperature 98.6      Collection site LEFT BRACHIAL      Drawn by [id number]      Sample type ARTERIAL DRAW      Allens test (pass/fail) PASS  PASS   PROTIME-INR     Status: Abnormal   Collection Time   12/05/12  4:49 AM      Component Value Range Comment   Prothrombin Time 44.2 (*) 11.6 - 15.2 seconds    INR 5.20 (*) 0.00 - 1.49   CBC     Status: Abnormal   Collection Time   12/05/12  4:49 AM      Component Value Range Comment   WBC 9.0  4.0 - 10.5 K/uL    RBC 3.18 (*) 3.87 - 5.11 MIL/uL    Hemoglobin 8.3 (*) 12.0 - 15.0 g/dL    HCT 84.1 (*) 32.4 - 46.0 %    MCV 87.7  78.0 - 100.0 fL    MCH 26.1  26.0 - 34.0 pg    MCHC 29.7 (*) 30.0 - 36.0 g/dL    RDW 40.1 (*) 02.7 - 15.5 %    Platelets 182  150 - 400 K/uL   BASIC METABOLIC PANEL     Status: Abnormal   Collection Time   12/05/12  4:49 AM      Component  Value Range Comment   Sodium 142  135 - 145 mEq/L    Potassium 5.5 (*) 3.5 - 5.1 mEq/L    Chloride 100  96 - 112 mEq/L    CO2 27  19 - 32 mEq/L    Glucose, Bld 114 (*) 70 - 99 mg/dL    BUN 81 (*) 6 - 23 mg/dL    Creatinine, Ser 1.61 (*) 0.50 - 1.10 mg/dL    Calcium 9.3  8.4 - 09.6 mg/dL    GFR calc non Af Amer 9 (*) >90 mL/min    GFR calc Af Amer 10 (*) >90 mL/min    No results  found.  Assessment: 1 Acute Kidney Injury Hemodynamically Mediated confounded by 20 L diuresis and Chronic Kidney Disease w/ Solitary Kidney & right heart disease 2  Biventricular CHF w/ Pulmonary Hypertension 3 COPD with hypercarbia 4 Chronic A fib with coagulopathy from warfarin 5 Hyperkalemia  Plan: 1 CXR 2 Renal Ultrasound 3 Agree with holding diuretics, gentle IVFs 4 Will see what happens with kidney function over next day or so off diuretics 5 Dialysis may be needed pending clinical status and if remains oliguric 6 Kayexylate  Desiree Pierce C 12/05/2012, 2:24 PM

## 2012-12-05 NOTE — Progress Notes (Signed)
Notified on-call and in house MD that patient is anxious with dyspnea and little urine output all day, drooling copious frothy sputum, lungs sound rhonchus, patient constantly saying "I need to sit up."  Have patient in high fowlers, O2 sats 92% and above on 3L nasal cannula, medicated for anxiety.  Continuing to monitor patient

## 2012-12-05 NOTE — Progress Notes (Signed)
MD had placed orders for dyspnea, anxiety.  After two hours, patient appears more comfortable;resting with eyes closed, decreased oral secretions. Continuing to monitor

## 2012-12-05 NOTE — Progress Notes (Signed)
Primary cardiologist: Dr. [name]  Subjective:   Has increased chest congestion, upper airway. Sputum production noted. No chest pain. Has been anxious.   Objective:   Temp:  [95 F (35 C)-97.4 F (36.3 C)] 97 F (36.1 C) (12/15 0745) Pulse Rate:  [29-104] 84  (12/15 0807) Resp:  [18-28] 18  (12/15 0807) BP: (100-137)/(44-97) 129/44 mmHg (12/15 0738) SpO2:  [81 %-98 %] 95 % (12/15 0807) Weight:  [212 lb 1.3 oz (96.2 kg)] 212 lb 1.3 oz (96.2 kg) (12/15 0200) Last BM Date: 11/30/12  Filed Weights   12/02/12 2332 12/04/12 0500 12/05/12 0200  Weight: 214 lb 8.1 oz (97.3 kg) 218 lb 14.7 oz (99.3 kg) 212 lb 1.3 oz (96.2 kg)    Intake/Output Summary (Last 24 hours) at 12/05/12 0900 Last data filed at 12/05/12 0800  Gross per 24 hour  Intake  693.7 ml  Output     60 ml  Net  633.7 ml   Telemetry: Atrial fibrillation with occasional PVCs.  Exam:  General: Obese woman, mild respiratory distress.  HEENT: Elevated JVP, no bruits.  Lungs: Decreased with upper airway congestion.  Cardiac: Irregularly irregular, no gallop.  Abdomen: , Nontender.  Extremities: 2+ edema.  Lab Results:  Basic Metabolic Panel:  Lab 12/05/12 1478 12/04/12 0454 12/03/12 0500  NA 142 143 143  K 5.5* 4.6 4.8  CL 100 100 100  CO2 27 30 30   GLUCOSE 114* 109* 118*  BUN 81* 76* 71*  CREATININE 4.38* 3.77* 3.23*  CALCIUM 9.3 9.5 9.5  MG -- -- --    CBC:  Lab 12/05/12 0449 12/04/12 0454 12/03/12 0500  WBC 9.0 8.2 7.2  HGB 8.3* 8.7* 9.5*  HCT 27.9* 28.9* 32.5*  MCV 87.7 88.1 89.5  PLT 182 180 185    Coagulation:  Lab 12/05/12 0449 12/04/12 0454 12/03/12 0500  INR 5.20* 4.93* 4.11*     Medications:   Scheduled Medications:    . antiseptic oral rinse  15 mL Mouth Rinse BID  . ciprofloxacin  250 mg Oral Q breakfast  . diltiazem  360 mg Oral Daily  . senna-docusate  1 tablet Oral Daily  . sodium chloride  3 mL Intravenous Q12H  . vancomycin  750 mg Intravenous Q48H    . Warfarin - Pharmacist Dosing Inpatient   Does not apply q1800    Infusions:    . DOPamine 2.524 mcg/kg/min (12/04/12 1900)    PRN Medications: sodium chloride, acetaminophen, ALPRAZolam, ondansetron (ZOFRAN) IV, sodium chloride, traMADol   Assessment:   1. Acute on chronic diastolic heart failure. LVEF 60% with moderate LVH. Volume status worsening with associated acute renal failure.  2. Severe pulmonary hypertension, PA systolic 76 mmHg by most recent echocardiogram. Revatio stopped recently by Dr. Shirlee Latch secondary to tremors. Recent hemodynamics noted per [facility].  3. Permanent atrial fibrillation.  4. Acute renal failure and baseline chronic kidney disease, stage IV with solitary kidney. Creatinine up to 3.7. Off milrinone and Lasix - started on Dopamine 12/14. Foley flushed and no significant residual bladder collection to suggest obstruction.  5. Obstructive sleep apnea.  6. Hypertension.  7. Urinary tract infection, on Cipro. Also on vancomycin related to [facility] in clusters with no growth on blood cultures so far. Possibly contaminant.  8. Deconditioning, wheelchair-bound. Ultimately plan for SNF at [facility].   Plan/Discussion:    Patient continues to worsen clinically with oliguric acute renal failure and worsening volume status. Reviewed briefly with Dr. Gala Romney. I will ask for  formal Nephrology consultation today to assist with management - spoke with Dr. Lowell Guitar. It looks like she may at least temporarily require hemodialysis.  Jonelle Sidle, M.D., F.A.C.C.

## 2012-12-05 NOTE — Progress Notes (Signed)
Paged Dr. Diona Browner and asked him to speak with the pts daughter for an update on the pts status.  MD came by and updated daughter.  Salomon Mast, RN

## 2012-12-06 ENCOUNTER — Inpatient Hospital Stay (HOSPITAL_COMMUNITY): Payer: Medicare Other

## 2012-12-06 ENCOUNTER — Encounter (HOSPITAL_COMMUNITY): Payer: Self-pay | Admitting: Acute Care

## 2012-12-06 DIAGNOSIS — R609 Edema, unspecified: Secondary | ICD-10-CM

## 2012-12-06 DIAGNOSIS — D6832 Hemorrhagic disorder due to extrinsic circulating anticoagulants: Secondary | ICD-10-CM

## 2012-12-06 DIAGNOSIS — J9600 Acute respiratory failure, unspecified whether with hypoxia or hypercapnia: Secondary | ICD-10-CM

## 2012-12-06 DIAGNOSIS — D649 Anemia, unspecified: Secondary | ICD-10-CM

## 2012-12-06 LAB — CBC
HCT: 27.6 % — ABNORMAL LOW (ref 36.0–46.0)
MCV: 87.3 fL (ref 78.0–100.0)
RBC: 3.16 MIL/uL — ABNORMAL LOW (ref 3.87–5.11)
WBC: 7.8 10*3/uL (ref 4.0–10.5)

## 2012-12-06 LAB — RENAL FUNCTION PANEL
BUN: 83 mg/dL — ABNORMAL HIGH (ref 6–23)
CO2: 27 mEq/L (ref 19–32)
Chloride: 99 mEq/L (ref 96–112)
Glucose, Bld: 94 mg/dL (ref 70–99)
Potassium: 5 mEq/L (ref 3.5–5.1)

## 2012-12-06 LAB — TYPE AND SCREEN
ABO/RH(D): B POS
Antibody Screen: NEGATIVE

## 2012-12-06 LAB — PROTIME-INR
INR: 2.33 — ABNORMAL HIGH (ref 0.00–1.49)
Prothrombin Time: 24.5 seconds — ABNORMAL HIGH (ref 11.6–15.2)

## 2012-12-06 MED ORDER — SODIUM CHLORIDE 0.9 % IJ SOLN
10.0000 mL | Freq: Two times a day (BID) | INTRAMUSCULAR | Status: DC
  Administered 2012-12-07 – 2012-12-09 (×5): 10 mL via INTRAVENOUS
  Administered 2012-12-10: 09:00:00 via INTRAVENOUS
  Administered 2012-12-10 – 2012-12-13 (×3): 10 mL via INTRAVENOUS
  Administered 2012-12-13: 20 mL via INTRAVENOUS
  Administered 2012-12-14: 10 mL via INTRAVENOUS
  Administered 2012-12-14: 3 mL via INTRAVENOUS
  Administered 2012-12-15 – 2012-12-16 (×3): 10 mL via INTRAVENOUS

## 2012-12-06 MED ORDER — CIPROFLOXACIN IN D5W 200 MG/100ML IV SOLN
200.0000 mg | Freq: Two times a day (BID) | INTRAVENOUS | Status: DC
  Administered 2012-12-06 – 2012-12-08 (×4): 200 mg via INTRAVENOUS
  Filled 2012-12-06 (×5): qty 100

## 2012-12-06 MED ORDER — VITAMIN K1 10 MG/ML IJ SOLN
5.0000 mg | Freq: Once | INTRAMUSCULAR | Status: AC
  Administered 2012-12-06: 5 mg via INTRAVENOUS
  Filled 2012-12-06 (×2): qty 0.5

## 2012-12-06 MED ORDER — ALTEPLASE 2 MG IJ SOLR
2.0000 mg | Freq: Once | INTRAMUSCULAR | Status: AC
  Administered 2012-12-06: 2 mg
  Filled 2012-12-06: qty 2

## 2012-12-06 MED ORDER — HEPARIN SODIUM (PORCINE) 1000 UNIT/ML DIALYSIS
1000.0000 [IU] | INTRAMUSCULAR | Status: DC | PRN
  Administered 2012-12-10: 3000 [IU] via INTRAVENOUS_CENTRAL
  Filled 2012-12-06: qty 3
  Filled 2012-12-06: qty 6

## 2012-12-06 MED ORDER — VANCOMYCIN HCL IN DEXTROSE 1-5 GM/200ML-% IV SOLN
1000.0000 mg | INTRAVENOUS | Status: DC
  Administered 2012-12-06 – 2012-12-07 (×2): 1000 mg via INTRAVENOUS
  Filled 2012-12-06 (×3): qty 200

## 2012-12-06 MED ORDER — HEPARIN BOLUS VIA INFUSION (CRRT)
1000.0000 [IU] | INTRAVENOUS | Status: DC | PRN
  Filled 2012-12-06: qty 1000

## 2012-12-06 MED ORDER — PRISMASOL BGK 4/2.5 32-4-2.5 MEQ/L IV SOLN
INTRAVENOUS | Status: DC
  Administered 2012-12-07 – 2012-12-10 (×18): via INTRAVENOUS_CENTRAL
  Filled 2012-12-06 (×36): qty 5000

## 2012-12-06 MED ORDER — SODIUM CHLORIDE 0.9 % IJ SOLN
250.0000 [IU]/h | INTRAMUSCULAR | Status: DC
  Administered 2012-12-07: 250 [IU]/h via INTRAVENOUS_CENTRAL
  Administered 2012-12-08: 800 [IU]/h via INTRAVENOUS_CENTRAL
  Administered 2012-12-08 – 2012-12-09 (×3): 1000 [IU]/h via INTRAVENOUS_CENTRAL
  Administered 2012-12-09: 950 [IU]/h via INTRAVENOUS_CENTRAL
  Administered 2012-12-10 (×2): 850 [IU]/h via INTRAVENOUS_CENTRAL
  Filled 2012-12-06 (×10): qty 2

## 2012-12-06 MED ORDER — PRISMASOL BGK 4/2.5 32-4-2.5 MEQ/L IV SOLN
INTRAVENOUS | Status: DC
  Administered 2012-12-07: 15:00:00 via INTRAVENOUS_CENTRAL
  Filled 2012-12-06 (×5): qty 5000

## 2012-12-06 MED ORDER — HEPARIN (PORCINE) 2000 UNITS/L FOR CRRT
INTRAVENOUS_CENTRAL | Status: DC | PRN
  Filled 2012-12-06: qty 1000

## 2012-12-06 MED ORDER — PRISMASOL BGK 4/2.5 32-4-2.5 MEQ/L IV SOLN
INTRAVENOUS | Status: DC
  Administered 2012-12-07 – 2012-12-10 (×7): via INTRAVENOUS_CENTRAL
  Filled 2012-12-06 (×7): qty 5000

## 2012-12-06 NOTE — Consult Note (Signed)
PULMONARY  / CRITICAL CARE MEDICINE  Name: Desiree Pierce MRN: [id number] DOB: Jun 18, 1931    LOS: 17  REFERRING MD :   Bensimhon  CHIEF COMPLAINT:   Respiratory distress.   BRIEF PATIENT DESCRIPTION:  78 year old debilitated female admitted 12/9 with decompensated heart failure, volume overload and acute on chronic renal failure. PCCM asked to eval on 12/16 given concern for worsening respiratory failure.   LINES / TUBES: 11/29 right [place] CVL >>>  CULTURES: [facility] 12/11: neg staff BC 12/11>>> UC 12/12 multiple bact/ >100K.Marland Kitchen MRSA PCR 11/29: positive  \ ANTIBIOTICS: vanc 12/12>>> cipro 12/10>>>  SIGNIFICANT EVENTS:  78yo female with PMHx s/f chronic diastolic CHF, congenital solitary kidney, CKD (stage IV, baseline Cr 1.5-2.4), COPD + OSA + morbid obesity ->secondary pulmonary HTN, permanent atrial fibrillation, OSA, GERD who presents to [facility] ED on 12/9 with shortness of breath. She last followed up in CHF clinic in 05/2012. Baseline weight 200-205 lbs. She lives alone and no longer received home health services. She is quite limited by arthritis, and is essentially wheelchair bound.  10/2011 echo: LVEF 55-60%, moderate LVH, mod-severe biatrial enlargement, mild MR, mod-severe TR. Had right heart cath on 12/11 showed severe PAH, volume overload, and RV dysfunction. PCCM. She had failed inotrope and diuretics. Developed progressive acute on chronic renal failure and worsening respiratory distress. PCCM asked to see on 12/16 for increased respiratory failure.   LEVEL OF CARE:  ICU PRIMARY SERVICE:  Hear failure CONSULTANTS:  Nephrology  CODE STATUS: full  DIET:   DVT Px:  GI Px:  PPI  HISTORY OF PRESENT ILLNESS:     PAST MEDICAL HISTORY :  Past Medical History  Diagnosis Date  . Morbid obesity   . Atrial fibrillation   . Arthritis   . Allergy history unknown   . Hypertension     Poorly controlled  . CHF (congestive heart failure)   . Pulmonary hypertension   . GERD  (gastroesophageal reflux disease)   . COPD (chronic obstructive pulmonary disease)   . OA (osteoarthritis)    Past Surgical History  Procedure Date  . Total abdominal hysterectomy   . Total knee arthroplasty     Right knee  . Ankle fracture surgery     Right ankle repair   Prior to Admission medications   Medication Sig Start Date End Date Taking? Authorizing Provider  Casanthranol-Docusate Sodium 30-100 MG CAPS Take 1 capsule by mouth daily.     Yes Historical Provider, MD  cloNIDine (CATAPRES) 0.3 MG tablet Take 0.3 mg by mouth daily.    Yes Historical Provider, MD  diltiazem (TIAZAC) 360 MG 24 hr capsule Take 360 mg by mouth daily.     Yes Historical Provider, MD  furosemide (LASIX) 40 MG tablet Take 1 tablet (40 mg total) by mouth 2 (two) times daily. 04/14/12  Yes Dolores Patty, MD  metoprolol tartrate (LOPRESSOR) 25 MG tablet Take 25 mg by mouth 2 (two) times daily.    Yes Historical Provider, MD  Multiple Vitamin (MULTIVITAMIN) tablet Take 1 tablet by mouth daily.     Yes Historical Provider, MD  potassium chloride SA (K-DUR,KLOR-CON) 20 MEQ tablet Take 40 mEq by mouth 2 (two) times daily.   Yes Historical Provider, MD  warfarin (COUMADIN) 2.5 MG tablet Take 2.5 mg by mouth daily. Patient takes 1 tablet (2.5 mg) every day except for Monday, Wednesday and Fridays she takes 1.5 tablets (3.75 mg)   Yes Historical Provider, MD   No Known Allergies  FAMILY HISTORY:  Family History  Problem Relation Age of Onset  . Allergies Mother   . Heart disease Mother   . Asthma Father   . Diabetes type II Maternal Grandmother    SOCIAL HISTORY:  reports that she quit smoking about 43 years ago. She does not have any smokeless tobacco history on file. She reports that she does not drink alcohol. Her drug history not on file.  REVIEW OF SYSTEMS:   Unable   INTERVAL HISTORY:  Now worse   VITAL SIGNS: Temp:  [97.1 F (36.2 C)-97.6 F (36.4 C)] 97.1 F (36.2 C) (12/16 1100) Pulse  Rate:  [76-101] 101  (12/16 1100) Resp:  [14-23] 21  (12/16 1100) BP: (119-152)/(62-95) 148/62 mmHg (12/16 1100) SpO2:  [94 %-100 %] 98 % (12/16 0905) Weight:  [97.3 kg (214 lb 8.1 oz)-98 kg (216 lb 0.8 oz)] 97.3 kg (214 lb 8.1 oz) (12/16 0455) HEMODYNAMICS: CVP:  [27 mmHg-38 mmHg] 38 mmHg VENTILATOR SETTINGS:   INTAKE / OUTPUT: Intake/Output      12/15 0701 - 12/16 0700 12/16 0701 - 12/17 0700   P.O. 300    I.V. (mL/kg) 1278.1 (13.1) 79.7 (0.8)   IV Piggyback     Total Intake(mL/kg) 1578.1 (16.2) 79.7 (0.8)   Urine (mL/kg/hr) 75 (0) 35 (0.1)   Stool 1    Total Output 76 35   Net +1502.1 +44.7          PHYSICAL EXAMINATION: General:  Awake, alert, no focal def Neuro:  WNL, no focal def  HEENT:  [place], +++ JVD in sitting position  Cardiovascular:  IV/VI SEM Lungs:  Decreased t/o, no accessory muscle use  Abdomen:  Obese + bowel sounds  Musculoskeletal:  Intact  Skin:  + lower extremity edema    LABS: Cbc  Lab 12/06/12 0450 12/05/12 0449 12/04/12 0454  WBC 7.8 -- --  HGB 8.3* 8.3* 8.7*  HCT 27.6* 27.9* 28.9*  PLT 204 182 180    Chemistry   Lab 12/06/12 0450 12/05/12 0449 12/04/12 0454  NA 141 142 143  K 5.0 5.5* 4.6  CL 99 100 100  CO2 27 27 30   BUN 83* 81* 76*  CREATININE 4.85* 4.38* 3.77*  CALCIUM 9.2 9.3 9.5  MG -- -- --  PHOS 5.7* -- --  GLUCOSE 94 114* 109*    Liver fxn  Lab 12/06/12 0450  AST --  ALT --  ALKPHOS --  BILITOT --  PROT --  ALBUMIN 3.3*   coags  Lab 12/06/12 0450 12/05/12 0449 12/04/12 0454  APTT -- -- --  INR 5.18* 5.20* 4.93*   Sepsis markers No results found for this basename: LATICACIDVEN:3,PROCALCITON:3 in the last 168 hours Cardiac markers No results found for this basename: CKTOTAL:3,CKMB:3,TROPONINI:3 in the last 168 hours BNP No results found for this basename: PROBNP:3 in the last 168 hours ABG  Lab 12/05/12 0045 11/30/12 1537 11/30/12 1532  PHART 7.322* -- 7.462*  PCO2ART 53.0* -- 49.7*  PO2ART 57.3* --  80.0  HCO3 26.7* 33.4*31.2* 35.5*  TCO2 28.3 3533 37    CBG trend  Lab 12/01/12 1608  GLUCAP 205*    IMAGING: PCXR: volume excess, Right > left volume loss.  ECG:  DIAGNOSES: Active Problems:  Acute on chronic diastolic heart failure  Uncontrolled hypertension  Acute on chronic renal failure  Acute renal failure  CKD (chronic kidney disease), stage IV  Acute respiratory failure  Warfarin-induced coagulopathy  Anemia   ASSESSMENT / PLAN:  PULMONARY  ASSESSMENT: Acute respiratory failure: in the setting of decompensated heart failure and pulmonary edema. Currently her physical exam looks better than her abg MO, h/o OSA, prob OHS: this is complicating her current respiratory status.  PLAN:   Cont pulse ox. Avoid narcotics. Repeat abg, if acidotic will intubate. Monitor O2 sats to avoid fluid overload.  Will hope to get in dialysis catheter and dialyze volume out before respiratory failure. Think we should trial BIPAP at HS and PRN to avoid resp muscle fatigue.  CARDIOVASCULAR  ASSESSMENT:  Decompensated acute on chronic diastolic heart failure (milrinone and lasix stopped 12/11)  Pulmonary edema Chronic Atrial Fib (currently w/ CVR)  Severe secondary PAH (revatio stopped 12/12)  Cor pulmonale Followed by heart failure team. Presented w/ wt gain and volume excess. Not responding to diuretics and milrinone and got hypotensive.    PLAN:  Initiate HD for volume removal Hemodynamics per heart failure team  RENAL  ASSESSMENT:   Acute on chronic renal failure  Hyperkalemia  Progressive cr, refractory volume excess.  PLAN:   HD and lytes per neprohology   GASTROINTESTINAL  ASSESSMENT:   Obesity  PLAN:   NPO incase we have to intubate, if tolerates dialysis the may start diet. PPI. Tube feeds if starts diet.  HEMATOLOGIC  ASSESSMENT:   Chronic anemia  Coumadin induced coagulopathy: got FFP and Vit K No evidence of bleeding  PLAN:  Monitor  CBC. Correct INR and follow up coags prior to placement of HD catheter.  INFECTIOUS  ASSESSMENT:   UTI 1/2 coag neg BC. Think this reflects contamination.  PLAN:   F/u repeat Cultures, if neg would d/c vanc  Cipro per primary team, would finish a 7 d total rx   ENDOCRINE  ASSESSMENT:   DM PLAN:   ssi  NEUROLOGIC  ASSESSMENT:   No acute issue  PLAN:   Supportive care   CLINICAL SUMMARY:  78 year old female w/ known h/o diastolic HF, CAF, and CRI. Now admitted for decomp heart failure and volume overload, complicated by progressive renal failure. We will place HD cath after coags corrected to allow for HD. Worried that her over all prognosis is poor and that she is at high risk for intubation.   I have personally obtained a history, examined the patient, evaluated laboratory and imaging results, formulated the assessment and plan and placed orders.  CRITICAL CARE: The patient is critically ill with multiple organ systems failure and requires high complexity decision making for assessment and support, frequent evaluation and titration of therapies, application of advanced monitoring technologies and extensive interpretation of multiple databases. Critical Care Time devoted to patient care services described in this note is 45 minutes   Alyson Reedy, M.D. [facility] Pulmonary/Critical Care Medicine. Pager: [phone]. After hours pager: [phone].

## 2012-12-06 NOTE — Progress Notes (Signed)
PT Cancellation Note  Patient Details Name: Desiree Pierce MRN: [id number] DOB: [birth year]   Cancelled Treatment:    Reason Eval/Treat Not Completed: Medical issues which prohibited therapy (INR 5.18 with transfer to 2900) Pt not medically appropriate at this time. Will continue to follow. Thanks [name], PT [phone]

## 2012-12-06 NOTE — Progress Notes (Signed)
Advanced Heart Failure Rounding Note   Subjective:    Desiree Pierce is a 78yo female with PMHx s/f chronic diastolic CHF, congenital solitary kidney, CKD (stage IV, baseline Cr 1.5-2.4), COPD + OSA + morbid obesity ->secondary pulmonary HTN, permanent atrial fibrillation, OSA, GERD who presents to [facility] ED with shortness of breath.   She last followed up in CHF clinic in 05/2012. Baseline weight 200-205 lbs.  She lives alone and no longer received home health services. She is quite limited by arthritis, and is essentially wheelchair bound.  10/2011 echo: LVEF 55-60%, moderate LVH, mod-severe biatrial enlargement, mild MR, mod-severe TR, normal RV size and function.   Admitted with progressive SOB and worsening LE edema.  POC TnI 0.02. pBNP 7488.0 .CXR reveals cardiomegaly, small R pleural effusion, otherwise unremarkable. BMET reveals BUN 41, Cr 2.14. Admit weight 232 pounds.  11/19/12 Started on Lasix 80 mg IV bid. 11/21/12 Continue on Lasix 80 mg IV tid and  Metolalzone 5 mg daily.   11/22/12 ECHO EF 60% Moderate LVH Mild Mitral regurgitation. +RV dysfunction  12/01/12: [facility] RA = 21  RV = 80/11/19  PA = 88/34 (56)  PCW = 24 v = 35  Fick cardiac output/index = 4.9/2.5  PVR = 7.3 woods  FA sat = 96%  PA sat = 55%, 57%  Started on lasix gtt and milrinone after cath.  UOP sluggish, weight up and hypotensive.  Therefore, lasix, milrinone, clonidine/toprol discontinued 12/11. 12/02/12 Revatio stopped due to tremors.    12/01/12 Blood cx- staphyl coccus (couagulase negatvie).  12/02/12 Urine >100,000  Multiple morphytes.  Day #4 of Cipro/ Day #3 Vancomycin.   Renal dose Dopamine started 12/04/12 due to climbing creatinine. (3.2>3.7). Nephrology consulted 12/05/12 with recommendations to hold diuretics and gently hydrate. Given one dose of kayexalate. ( potassium 5.5>5.0)  Cr 2.42>3.07>3.23>3.77>4.38>4.85   Weak. Somewhat confused and lethargic. Visibly dyspneic.  CVP:  25-30   Objective:     Vital Signs:   Temp:  [97.4 F (36.3 C)-97.8 F (36.6 C)] 97.6 F (36.4 C) (12/16 0757) Pulse Rate:  [76-95] 94  (12/16 0757) Resp:  [14-23] 15  (12/16 0757) BP: (113-152)/(56-86) 119/86 mmHg (12/16 0757) SpO2:  [94 %-100 %] 100 % (12/16 0757) Weight:  [97.3 kg (214 lb 8.1 oz)-98 kg (216 lb 0.8 oz)] 97.3 kg (214 lb 8.1 oz) (12/16 0455) Last BM Date: 12/05/12  Weight change: Filed Weights   12/05/12 0200 12/06/12 0405 12/06/12 0455  Weight: 96.2 kg (212 lb 1.3 oz) 98 kg (216 lb 0.8 oz) 97.3 kg (214 lb 8.1 oz)    Intake/Output:   Intake/Output Summary (Last 24 hours) at 12/06/12 0817 Last data filed at 12/06/12 0600  Gross per 24 hour  Intake 1553.4 ml  Output     66 ml  Net 1487.4 ml     Physical Exam:  General:  Lying in bed.  Dyspneic at rest HEENT: normal Neck: supple. JVP to jaw. Carotids 2+ bilat; no bruits. No lymphadenopathy or thryomegaly appreciated. RIJ Cor: PMI nondisplaced. Irregular rate & rhythm. No rubs, gallops or murmurs. Lungs: diminished. tachypneic Abdomen: soft, nontender, nondistended. No hepatosplenomegaly. No bruits or masses. Good bowel sounds. Extremities: no cyanosis, clubbing, rash, +trace-1+ edema Neuro: alert & orientedx3, cranial nerves grossly intact. moves all 4 extremities w/o difficulty. Affect pleasant.  GU: Foley  Telemetry: A fib 80-90s  Labs: Basic Metabolic Panel:  Lab 12/06/12 1610 12/05/12 0449 12/04/12 0454 12/03/12 0500 12/02/12 0500  NA 141 142 143 143 143  K 5.0 5.5* 4.6 4.8 4.5  CL 99 100 100 100 102  CO2 27 27 30 30 31   GLUCOSE 94 114* 109* 118* 87  BUN 83* 81* 76* 71* 68*  CREATININE 4.85* 4.38* 3.77* 3.23* 3.07*  CALCIUM 9.2 9.3 9.5 -- --  MG -- -- -- -- --  PHOS 5.7* -- -- -- --    Liver Function Tests:  Lab 12/06/12 0450  AST --  ALT --  ALKPHOS --  BILITOT --  PROT --  ALBUMIN 3.3*   No results found for this basename: LIPASE:5,AMYLASE:5 in the last 168 hours No results found for this basename:  AMMONIA:3 in the last 168 hours  CBC:  Lab 12/06/12 0450 12/05/12 0449 12/04/12 0454 12/03/12 0500 12/02/12 0500  WBC 7.8 9.0 8.2 7.2 6.9  NEUTROABS -- -- -- -- --  HGB 8.3* 8.3* 8.7* 9.5* 9.4*  HCT 27.6* 27.9* 28.9* 32.5* 31.6*  MCV 87.3 87.7 88.1 89.5 88.5  PLT 204 182 180 185 188    Cardiac Enzymes: No results found for this basename: CKTOTAL:5,CKMB:5,CKMBINDEX:5,TROPONINI:5 in the last 168 hours  BNP: BNP (last 3 results)  Basename 11/19/12 1655  PROBNP 7488.0*     Medications:     Scheduled Medications:    . antiseptic oral rinse  15 mL Mouth Rinse BID  . ciprofloxacin  250 mg Oral Q breakfast  . diltiazem  360 mg Oral Daily  . senna-docusate  1 tablet Oral Daily  . sodium chloride  3 mL Intravenous Q12H  . vancomycin  750 mg Intravenous Q48H  . Warfarin - Pharmacist Dosing Inpatient   Does not apply q1800    Infusions:    . sodium chloride 75 mL/hr at 12/06/12 0557  . DOPamine 2.524 mcg/kg/min (12/04/12 1900)    PRN Medications: acetaminophen, ALPRAZolam, ondansetron (ZOFRAN) IV, sodium chloride, traMADol   Assessment:   1. Acute on chronic diastolic CHF  2. Permanent atrial fibrillation  3. Morbid obesity  4. Acute/Chronic renal failure, stage 4       --solitary kidney  5. OSA  6. Physical debility/deconditioning - wheelchair bound  7. Noncompliance  8. Hypertension, poorly controlled 9. UTI, cipro started 12/10  Plan/Discussion:    She remains essentially anuric with worsening renal failure. Now uremic with respiratory distress. Will move to ICU. May need to be intubated. Will contact renal to initiate HF. However INR 5.2. Will give 2u FFP and 16m Vitamin K. Will need CCM on board as she may need to be intubated with additional volume from FFP.    UTI - continue cipro until foley is removed.  Continue vancomycin for 1/2 blood cultures Staph coagulase negative.         Rate controlled. INR 5.18. Hold Coumadin per pharmacy.   Last dose of  coumadin 11/23/12. Give FFP and vitK  The patient is critically ill with multiple organ systems failure and requires high complexity decision making for assessment and support, frequent evaluation and titration of therapies, application of advanced monitoring technologies and extensive interpretation of multiple databases.   Critical Care Time devoted to patient care services described in this note is 35 Minutes.    Length of Stay: 17  [name] 12/06/2012 8:17 AM

## 2012-12-06 NOTE — Progress Notes (Signed)
INR still >2.0   We will transfuse 2 more units of FFP

## 2012-12-06 NOTE — Progress Notes (Addendum)
MD at bedside, pt SOB. MD aware of AM labs, INR, BUN, Creat, and CVP of 28. Per MD, pt to tx to 2900 ICU for reversal of INR and potential CVVHD. Told to tx pt prior to pt receiving new orders.   Holly Bodily

## 2012-12-06 NOTE — Progress Notes (Signed)
ANTICOAGULATION/ANTIBIOTIC CONSULT NOTE - Follow Up Consult  Pharmacy Consult for Coumadin, Vancomycin Indication: afib, positive blood culture  No Known Allergies  Patient Measurements: Height: 5\' 2"  (157.5 cm) Weight: 214 lb 8.1 oz (97.3 kg) IBW/kg (Calculated) : 50.1   Vital Signs: Temp: 97.1 F (36.2 C) (12/16 1100) Temp src: Oral (12/16 1045) BP: 148/62 mmHg (12/16 1100) Pulse Rate: 101  (12/16 1100)  Labs:  Basename 12/06/12 0450 12/05/12 0449 12/04/12 0454  HGB 8.3* 8.3* --  HCT 27.6* 27.9* 28.9*  PLT 204 182 180  APTT -- -- --  LABPROT 44.3* 44.2* 42.7*  INR 5.18* 5.20* 4.93*  HEPARINUNFRC -- -- --  CREATININE 4.85* 4.38* 3.77*  CKTOTAL -- -- --  CKMB -- -- --  TROPONINI -- -- --    Estimated Creatinine Clearance: 10.2 ml/min (by C-G formula based on Cr of 4.85).  Assessment: 81yof continues with a supratherpeutic INR despite holding coumadin. Last dose of coumadin was on 12/11. Cipro started 12/12 which can elevate INR. She will receive 2 units of FFP and 5mg  of IV vitamin k today to reverse INR for HD catheter placement.  Also on day #5 vancomycin for coag negative staph in 1/2 blood cultures. With rising sCr and anuria, she will now begin CRRT. Received 1250mg  on 12/12 and 750mg  on 12/14 so will not re-load prior to CRRT initiation.  Vanc12/13 >> Cipro 12/12>> 12/11 BCx x 2 > 1/2 CNS 12/12 UCx > negative 12/9 UCx >  >100k multiple bacteria  Goal of Therapy:  INR 2-3 Monitor platelets by anticoagulation protocol: Yes Vancomycin trough 15-20   Plan:  1) Continue to hold coumadin 2) Change vancomycin to 1g IV q24 3) Change cipro to 200mg  IV q12  Fredrik Rigger 12/06/2012,11:31 AM

## 2012-12-06 NOTE — Progress Notes (Addendum)
Attempted to call report to 2900   08:58 attempted to call report to 2900  Report given to 2900

## 2012-12-06 NOTE — Progress Notes (Signed)
Subjective:  Says she feels a little less short of breath right now (by report there was concern ealrier that she might need intubation so was moved to ICU) To get 2 units of FFP for correction of INR for dialysis line placement as patient is now anuric and was more acutely dyspneic earlier Dr. Teressa Lower thought she was uremic - she seems pretty oriented right now and appears to understand that she will be getting dialysis Daughter is here from OOT (her husband is on HD - explained to her that her Mom's situation is a little different  Objective Vital signs in last 24 hours: Filed Vitals:   12/06/12 0455 12/06/12 0757 12/06/12 0800 12/06/12 0905  BP:  119/86 134/74 141/95  Pulse:  94 97 101  Temp:  97.6 F (36.4 C)    TempSrc:  Oral    Resp:  15  20  Height:      Weight: 97.3 kg (214 lb 8.1 oz)     SpO2:  100% 100% 98%   Weight change: 1.8 kg (3 lb 15.5 oz)  Intake/Output Summary (Last 24 hours) at 12/06/12 1039 Last data filed at 12/06/12 0800  Gross per 24 hour  Intake 1583.7 ml  Output    101 ml  Net 1482.7 ml   Physical Exam:  Blood pressure 141/95, pulse 101, temperature 97.6 F (36.4 C), temperature source Oral, resp. rate 20, height 5\' 2"  (1.575 m), weight 97.3 kg (214 lb 8.1 oz), SpO2 98.00%. (Baseline weight 200-205 lb) Weights:  96.2 kg (212 lb 1.3 oz)  98 kg (216 lb 0.8 oz)  97.3 kg (214 lb 8.1 oz)   Very nice older woman NAD at the present time Oriented to person and place JVD to jaw angle S1S2 no audible S3 Breath sounds decreased bilat - fairly clear ant Abdomen obese without tenderness 1+ edema right more than left Scar right knee from prev surg Foley in place about 20 cc dark brown urine Telemetry: A fib 80-90s Current CVP pending (last 23?)  Labs: Basic Metabolic Panel:  Lab 12/06/12 1610 12/05/12 0449 12/04/12 0454 12/03/12 0500 12/02/12 0500 12/01/12 0410 11/30/12 0500  NA 141 142 143 143 143 143 143  K 5.0 5.5* 4.6 4.8 4.5 4.5 4.9  CL 99 100  100 100 102 101 100  CO2 27 27 30 30 31  34* 38*  GLUCOSE 94 114* 109* 118* 87 133* 83  BUN 83* 81* 76* 71* 68* 64* 63*  CREATININE 4.85* 4.38* 3.77* 3.23* 3.07* 2.42* 2.39*  ALB -- -- -- -- -- -- --  CALCIUM 9.2 9.3 9.5 9.5 9.2 8.9 9.0  PHOS 5.7* -- -- -- -- -- --   Liver Function Tests:  Lab 12/06/12 0450  AST --  ALT --  ALKPHOS --  BILITOT --  PROT --  ALBUMIN 3.3*   CBC:  Lab 12/06/12 0450 12/05/12 0449 12/04/12 0454 12/03/12 0500  WBC 7.8 9.0 8.2 7.2  NEUTROABS -- -- -- --  HGB 8.3* 8.3* 8.7* 9.5*  HCT 27.6* 27.9* 28.9* 32.5*  MCV 87.3 87.7 88.1 89.5  PLT 204 182 180 185  CBG:  Lab 12/01/12 1608  GLUCAP 205*   Lab Results  Component Value Date   INR 5.18* 12/06/2012   INR 5.20* 12/05/2012   INR 4.93* 12/04/2012   Studies/Results: US Renal Port  12/05/2012  *RADIOLOGY REPORT*  Clinical Data: Chronic kidney disease.  Acute kidney injury.  RENAL/URINARY TRACT ULTRASOUND COMPLETE  Comparison:  None.  Findings:  Right Kidney:  11.1 cm in length.  The slightly echogenic renal parenchyma suggesting renal medical disease.  3.3 cm hypoechoic area in the medial aspect of the upper pole consistent with a cyst. No hydronephrosis.  Left Kidney:  The left kidney cannot be identified.  The patient has a history of probable solitary kidney.  Bladder:  Foley catheter in place.  IMPRESSION: No visible left kidney.  The right kidney is echogenic suggesting renal medical disease.   Original Report Authenticated By: Francene Boyers, M.D.    Dg Chest Port 1 View  12/05/2012  *RADIOLOGY REPORT*  Clinical Data: Cough.  Congestive heart failure.  PORTABLE CHEST - 1 VIEW  Comparison: 11/19/2012  Findings: Cardiomegaly stable.  Elevation of right hemidiaphragm is unchanged.  No evidence of acute infiltrate or pleural effusion. Right subclavian center venous catheter remains in appropriate position.  IMPRESSION: Stable cardiomegaly.  No acute findings.   Original Report Authenticated By: Myles Rosenthal, M.D.    Medications:    . sodium chloride 75 mL/hr at 12/06/12 0557  . DOPamine 2.524 mcg/kg/min (12/04/12 1900)      . antiseptic oral rinse  15 mL Mouth Rinse BID  . ciprofloxacin  250 mg Oral Q breakfast  . diltiazem  360 mg Oral Daily  . phytonadione (VITAMIN K) IV  5 mg Intravenous Once  . senna-docusate  1 tablet Oral Daily  . sodium chloride  3 mL Intravenous Q12H  . vancomycin  750 mg Intravenous Q48H  . Warfarin - Pharmacist Dosing Inpatient   Does not apply q1800    I  have reviewed scheduled and prn medications.  ASSESSMENT/RECOMMENDATIONS 78 y.o. female with PMHx s/f chronic diastolic CHF, congenital solitary kidney, CKD (stage IV, baseline Cr about 2mg /dl), COPD + OSA + morbid obesity, secondary pulmonary HTN, permanent atrial fibrillation, OSA, GERD with:  1. Acute Kidney Injury  (on chronic with solitary kidney/baseline creatinine 2 as recently as 11/23/12))  Hemodynamically mediated confounded by 20 L diuresis and severe right heart disease Creatinine continues to rise Question of some uremic symptoms Oligoanuric and more short of breath Still 10+ pounds over outpt clinic weight Hyperkalemia required kayexelate  Feel appropriate to initiate CRRT for some additional slow volume and for correction of azotemia She has such severe right heart issues, with CKD 4 at baseline in a solitary kidney, there is a fair chance she will not recover function In addition, if soft blood pressures, HD MAY be difficult but we will see  Dr. Teressa Lower wishes CVP 15-18 or "as much fluid as we can safely get" (will use goal of 50-75 ml/hour with that goal CVP) Needs placement of temp HD cath FFP/Vit K for correction of prolonged INR CCM to place catheter Will initiate CRRT after catheter placement Stop IVF Can dilt be reduced if low BP becomes issue???  Spoke briefly with daughter - there are lots of family - not sure their expectations are totally realistic as to potential  renal and cardiac outcomes but will keep them abreast of her renal situation each day  2 Biventricular CHF w/ Pulmonary Hypertension (severe)  3 COPD with hypercarbia  4 Chronic A fib with coagulopathy from warfarin  5 Hyperkalemia   Camille Bal, MD [facility] [phone] pager 12/06/2012, 10:39 AM

## 2012-12-06 NOTE — Progress Notes (Signed)
eLink Physician-Brief Progress Note Patient Name: Katheryne Gorr DOB: 1931-01-22 MRN: [id number]  Date of Service  12/06/2012   HPI/Events of Note  Received 5th unit of FFP   eICU Interventions  Plan: F/U with protime/INR   Intervention Category Minor Interventions: Clinical assessment - ordering diagnostic tests  Jaidev Sanger 12/06/2012, 11:37 PM

## 2012-12-06 NOTE — Progress Notes (Signed)
Placed pt. On bipap as per order. Pt. Is tolerating well at this time. RT to monitor. 

## 2012-12-07 ENCOUNTER — Inpatient Hospital Stay (HOSPITAL_COMMUNITY): Payer: Medicare Other

## 2012-12-07 DIAGNOSIS — J449 Chronic obstructive pulmonary disease, unspecified: Secondary | ICD-10-CM

## 2012-12-07 LAB — BLOOD GAS, ARTERIAL
Acid-Base Excess: 3 mmol/L — ABNORMAL HIGH (ref 0.0–2.0)
Drawn by: 31101
O2 Content: 4 L/min
O2 Saturation: 82.6 %
pCO2 arterial: 51.2 mmHg — ABNORMAL HIGH (ref 35.0–45.0)

## 2012-12-07 LAB — PREPARE FRESH FROZEN PLASMA
Unit division: 0
Unit division: 0
Unit division: 0

## 2012-12-07 LAB — POCT ACTIVATED CLOTTING TIME
Activated Clotting Time: 187 seconds
Activated Clotting Time: 182 seconds
Activated Clotting Time: 181 seconds
Activated Clotting Time: 182 seconds

## 2012-12-07 LAB — CBC
Hemoglobin: 7.7 g/dL — ABNORMAL LOW (ref 12.0–15.0)
MCH: 26.2 pg (ref 26.0–34.0)
MCHC: 30 g/dL (ref 30.0–36.0)
RDW: 17.1 % — ABNORMAL HIGH (ref 11.5–15.5)

## 2012-12-07 LAB — MAGNESIUM: Magnesium: 2.1 mg/dL (ref 1.5–2.5)

## 2012-12-07 LAB — PROTIME-INR
INR: 1.9 — ABNORMAL HIGH (ref 0.00–1.49)
Prothrombin Time: 21.1 seconds — ABNORMAL HIGH (ref 11.6–15.2)
Prothrombin Time: 21.2 seconds — ABNORMAL HIGH (ref 11.6–15.2)

## 2012-12-07 LAB — FERRITIN: Ferritin: 200 ng/mL (ref 10–291)

## 2012-12-07 LAB — RENAL FUNCTION PANEL
BUN: 83 mg/dL — ABNORMAL HIGH (ref 6–23)
Calcium: 9.5 mg/dL (ref 8.4–10.5)
Calcium: 9.2 mg/dL (ref 8.4–10.5)
Creatinine, Ser: 4.71 mg/dL — ABNORMAL HIGH (ref 0.50–1.10)
GFR calc Af Amer: 11 mL/min — ABNORMAL LOW (ref >90)
GFR calc non Af Amer: 9 mL/min — ABNORMAL LOW (ref >90)
Glucose, Bld: 73 mg/dL (ref 70–99)
Phosphorus: 5.9 mg/dL — ABNORMAL HIGH (ref 2.3–4.6)
Phosphorus: 5.6 mg/dL — ABNORMAL HIGH (ref 2.3–4.6)
Sodium: 142 mEq/L (ref 135–145)

## 2012-12-07 LAB — IRON AND TIBC: TIBC: 419 ug/dL (ref 250–470)

## 2012-12-07 MED ORDER — NEPRO/CARBSTEADY PO LIQD
237.0000 mL | ORAL | Status: DC | PRN
  Administered 2012-12-09 – 2012-12-11 (×2): 150 mL via ORAL
  Filled 2012-12-07: qty 237

## 2012-12-07 NOTE — Progress Notes (Signed)
RN took pt. Off bipap, due to pt. Anxiety. Pt. Has on venturi mask at this time.

## 2012-12-07 NOTE — Progress Notes (Signed)
Subjective:  Could not get dialysis catheter yet due to difficulty correcting INR Required BIPAP during the night Nasal cannula now CVP up to 39 Has received 9 units FFP to correct INR - just below 2 Catheter to be placed shortly Remains oligoanuric  Objective Vital signs in last 24 hours: Filed Vitals:   12/07/12 0900 12/07/12 0930 12/07/12 1000 12/07/12 1030  BP: 150/83 145/52 130/82 130/69  Pulse: 102  98 95  Temp: 97.9 F (36.6 C)   97.9 F (36.6 C)  TempSrc:    Oral  Resp: 22  26 27   Height:      Weight:      SpO2: 96%  95% 89%   Weight change: 6.4 kg (14 lb 1.8 oz)  Intake/Output Summary (Last 24 hours) at 12/07/12 1040 Last data filed at 12/07/12 1000  Gross per 24 hour  Intake 3913.63 ml  Output    312 ml  Net 3601.63 ml    Physical Exam:  Blood pressure 130/69, pulse 95, temperature 97.9 F (36.6 C), temperature source Oral, resp. rate 27, height 5\' 2"  (1.575 m), weight 104.4 kg (230 lb 2.6 oz), SpO2 89.00%. CVP 39  Weights: (note 200-205 lb usual outpt wt before current decomp)  12/07/12 0500 104.4 kg (230 lb 2.6 oz) -- -- SP  12/06/12 0455 97.3 kg (214 lb 8.1 oz) -- -- AP  12/06/12 0405 98 kg (216 lb 0.8 oz) -- -- LM  12/05/12 0200 96.2 kg (212 lb 1.3 oz) -- -- AP  12/04/12 0500 99.3 kg (218 lb 14.7 oz) -- -- [facility]  12/02/12 2332 97.3 kg (214 lb 8.1 oz) -- -- [facility]  12/01/12 0619 95.6 kg (210 lb)   Still with anasarca JVD to jaw angle Lungs unchanged Dark brown urine minimal amount  Labs: Basic Metabolic Panel:  Lab 12/07/12 4098 12/06/12 0450 12/05/12 0449 12/04/12 0454 12/03/12 0500 12/02/12 0500 12/01/12 0410  NA 143 141 142 143 143 143 143  K 4.4 5.0 5.5* 4.6 4.8 4.5 4.5  CL 97 99 100 100 100 102 101  CO2 28 27 27 30 30 31  34*  GLUCOSE 73 94 114* 109* 118* 87 133*  BUN 83* 83* 81* 76* 71* 68* 64*  CREATININE 4.71* 4.85* 4.38* 3.77* 3.23* 3.07* 2.42*  ALB -- -- -- -- -- -- --  CALCIUM 9.5 9.2 9.3 9.5 9.5 9.2 8.9  PHOS 5.9* 5.7* -- -- -- -- --    Liver Function Tests:  Lab 12/07/12 0450 12/06/12 0450  AST -- --  ALT -- --  ALKPHOS -- --  BILITOT -- --  PROT -- --  ALBUMIN 3.9 3.3*  CBC:  Lab 12/07/12 0450 12/06/12 0450 12/05/12 0449 12/04/12 0454  WBC 8.1 7.8 9.0 8.2  NEUTROABS -- -- -- --  HGB 7.7* 8.3* 8.3* 8.7*  HCT 25.7* 27.6* 27.9* 28.9*  MCV 87.4 87.3 87.7 88.1  PLT 169 204 182 180   CBG:  Lab 12/01/12 1608  GLUCAP 205*    Iron Studies:  Lab 12/07/12 0450  IRON 41*  TIBC 419  TRANSFERRIN --  FERRITIN 200   Lab Results  Component Value Date   INR 1.90* 12/07/2012   INR 1.92* 12/07/2012   INR 2.14* 12/06/2012    Results for STEFANI, BAIK (MRN [id number]) as of 12/07/2012 10:50  Ref. Range 12/07/2012 05:00  Sample type No range found ARTERIAL DRAW  Delivery systems No range found NASAL CANNULA  O2 Content No range found 4.0  pH, Arterial  Latest Range: 7.350-7.450  7.357  pCO2 arterial Latest Range: 35.0-45.0 mmHg 51.2 (H)  pO2, Arterial Latest Range: 80.0-100.0 mmHg 49.9 (L)  Bicarbonate Latest Range: 20.0-24.0 mEq/L 28.0 (H)  TCO2 Latest Range: 0-100 mmol/L 29.6  Acid-Base Excess Latest Range: 0.0-2.0 mmol/L 3.0 (H)  O2 Saturation No range found 82.6  Patient temperature No range found 98.6  Collection site No range found RIGHT RADIAL  Allens test (pass/fail) Latest Range: PASS  PASS   Medications:    . sodium chloride 10 mL/hr at 12/06/12 2000  . DOPamine 2.5 mcg/kg/min (12/06/12 2059)  . heparin 10,000 units/ 20 mL infusion syringe    . dialysis replacement fluid (prismasate)    . dialysis replacement fluid (prismasate)    . dialysate (PRISMASATE)        . antiseptic oral rinse  15 mL Mouth Rinse BID  . ciprofloxacin  200 mg Intravenous Q12H  . diltiazem  360 mg Oral Daily  . senna-docusate  1 tablet Oral Daily  . sodium chloride  10 mL Intravenous Q12H  . vancomycin  1,000 mg Intravenous Q24H  . Warfarin - Pharmacist Dosing Inpatient   Does not apply q1800    I  have  reviewed scheduled and prn medications.  ASSESSMENT/RECOMMENDATIONS   78 y.o. female with PMHx s/f chronic diastolic CHF, congenital solitary kidney, CKD (stage IV, baseline Cr about 2mg /dl), COPD + OSA + morbid obesity, secondary pulmonary HTN, permanent atrial fibrillation, OSA, GERD with oligoanuric AKI on CKD :  1. Acute Kidney Injury  She has such severe right heart issues, with CKD 4 at baseline in a solitary kidney, there is a fair chance she will not recover renal function Start CRRT today once line in  Dr. Teressa Lower wishes CVP 15-18 or "as much fluid as we can safely get" (will use goal of 50-75 ml/hour with that goal CVP to start)  2. Biventricular CHF w/ Pulmonary Hypertension (severe)  3. COPD with hypercarbia  4. Chronic A fib with coagulopathy from warfarin    Camille Bal, MD [facility] [phone] pager 12/07/2012, 10:40 AM

## 2012-12-07 NOTE — Progress Notes (Signed)
PT Cancellation Note  Patient Details Name: Desiree Pierce MRN: [id number] DOB: 1931/10/10   Cancelled Treatment:    Reason Eval/Treat Not Completed: Patient not medically ready (pt beginning CVVHD with acute respiratory worsening) Will check back as pt medically appropriate. Thanks [name], PT [phone]

## 2012-12-07 NOTE — Progress Notes (Signed)
Pt requested to come off bipap. Placed pt on 4L nasal cannula.

## 2012-12-07 NOTE — Progress Notes (Addendum)
PULMONARY  / CRITICAL CARE MEDICINE  Name: Nicholle Falzon MRN: [id number] DOB: 06-Apr-1931    LOS: 18  REFERRING MD :   Bensimhon  CHIEF COMPLAINT:   Respiratory distress.   BRIEF PATIENT DESCRIPTION:  78 year old debilitated female admitted 12/9 with decompensated heart failure, volume overload and acute on chronic renal failure. PCCM asked to eval on 12/16 given concern for worsening respiratory failure.   LINES / TUBES: 11/29 right [place] CVL >>>  CULTURES: [facility] 12/11: neg staff BC 12/11>>> UC 12/12 multiple bact/ >100K.Marland Kitchen MRSA PCR 11/29: positive  \ ANTIBIOTICS: vanc 12/12>>> cipro 12/10>>>  SIGNIFICANT EVENTS:  78yo female with PMHx s/f chronic diastolic CHF, congenital solitary kidney, CKD (stage IV, baseline Cr 1.5-2.4), COPD + OSA + morbid obesity ->secondary pulmonary HTN, permanent atrial fibrillation, OSA, GERD who presents to [facility] ED on 12/9 with shortness of breath. She last followed up in CHF clinic in 05/2012. Baseline weight 200-205 lbs. She lives alone and no longer received home health services. She is quite limited by arthritis, and is essentially wheelchair bound.  10/2011 echo: LVEF 55-60%, moderate LVH, mod-severe biatrial enlargement, mild MR, mod-severe TR. Had right heart cath on 12/11 showed severe PAH, volume overload, and RV dysfunction. PCCM. She had failed inotrope and diuretics. Developed progressive acute on chronic renal failure and worsening respiratory distress. PCCM asked to see on 12/16 for increased respiratory failure.   VITAL SIGNS: Temp:  [95.5 F (35.3 C)-98.9 F (37.2 C)] 97.9 F (36.6 C) (12/17 0900) Pulse Rate:  [31-121] 102  (12/17 0900) Resp:  [11-28] 22  (12/17 0900) BP: (105-166)/(40-100) 145/52 mmHg (12/17 0930) SpO2:  [91 %-100 %] 96 % (12/17 0900) Weight:  [104.4 kg (230 lb 2.6 oz)] 104.4 kg (230 lb 2.6 oz) (12/17 0500) HEMODYNAMICS: CVP:  [34 mmHg-38 mmHg] 36 mmHg VENTILATOR SETTINGS:   INTAKE / OUTPUT: Intake/Output      12/16  0701 - 12/17 0700 12/17 0701 - 12/18 0700   P.O. 50    I.V. (mL/kg) 629.1 (6) 29.4 (0.3)   Blood 2230.2 300   IV Piggyback 360 200   Total Intake(mL/kg) 3269.2 (31.3) 529.4 (5.1)   Urine (mL/kg/hr) 222 (0.1) 50   Stool     Total Output 222 50   Net +3047.2 +479.4         PHYSICAL EXAMINATION: General:  Awake, alert, no focal def Neuro:  WNL, no focal def  HEENT:  [place], +++ JVD in sitting position  Cardiovascular:  IV/VI SEM Lungs:  Decreased t/o, no accessory muscle use  Abdomen:  Obese + bowel sounds  Musculoskeletal:  Intact  Skin:  + lower extremity edema   LABS: Cbc  Lab 12/07/12 0450 12/06/12 0450 12/05/12 0449  WBC 8.1 -- --  HGB 7.7* 8.3* 8.3*  HCT 25.7* 27.6* 27.9*  PLT 169 204 182   Chemistry  Lab 12/07/12 0450 12/06/12 0450 12/05/12 0449  NA 143 141 142  K 4.4 5.0 5.5*  CL 97 99 100  CO2 28 27 27   BUN 83* 83* 81*  CREATININE 4.71* 4.85* 4.38*  CALCIUM 9.5 9.2 9.3  MG 2.1 -- --  PHOS 5.9* 5.7* --  GLUCOSE 73 94 114*   Liver fxn  Lab 12/07/12 0450 12/06/12 0450  AST -- --  ALT -- --  ALKPHOS -- --  BILITOT -- --  PROT -- --  ALBUMIN 3.9 3.3*   coags  Lab 12/07/12 0450 12/07/12 0005 12/06/12 1822  APTT 50* -- --  INR  1.90* 1.92* 2.14*   Sepsis markers No results found for this basename: LATICACIDVEN:3,PROCALCITON:3 in the last 168 hours Cardiac markers No results found for this basename: CKTOTAL:3,CKMB:3,TROPONINI:3 in the last 168 hours BNP No results found for this basename: PROBNP:3 in the last 168 hours ABG  Lab 12/07/12 0500 12/05/12 0045 11/30/12 1537 11/30/12 1532  PHART 7.357 7.322* -- 7.462*  PCO2ART 51.2* 53.0* -- 49.7*  PO2ART 49.9* 57.3* -- 80.0  HCO3 28.0* 26.7* 33.4*31.2* --  TCO2 29.6 28.3 3533 --   CBG trend  Lab 12/01/12 1608  GLUCAP 205*   IMAGING: PCXR: volume excess, Right > left volume loss.  ECG:  DIAGNOSES: Active Problems:  Acute on chronic diastolic heart failure  Uncontrolled hypertension  Acute  on chronic renal failure  Acute renal failure  CKD (chronic kidney disease), stage IV  Acute respiratory failure  Warfarin-induced coagulopathy  Anemia  ASSESSMENT / PLAN:  PULMONARY  ASSESSMENT: Acute respiratory failure: in the setting of decompensated heart failure, renal failure and pulmonary edema. Currently her physical exam looks better than her abg MO, h/o OSA, prob OHS: this is complicating her current respiratory status.  PLAN:   Cont pulse ox. Avoid narcotics. Monitor O2 sats to avoid fluid overload.  Dialysis catheter to be inserted now and start CRRT prior to muscle fatigue as patient is getting more and more fluid with FFP to avoid bleeding (INR was 5).  Keep under close observation for hypoxic respiratory failure from fluid overload. Will make BiPAP available as needed.  CARDIOVASCULAR  ASSESSMENT:  Decompensated acute on chronic diastolic heart failure (milrinone and lasix stopped 12/11)  Pulmonary edema Chronic Atrial Fib (currently w/ CVR)  Severe secondary PAH (revatio stopped 12/12)  Cor pulmonale Followed by heart failure team. Presented w/ wt gain and volume excess. Not responding to diuretics and milrinone and got hypotensive.    PLAN:  Initiate HD for volume removal. Hemodynamics appear stable for now.  RENAL  ASSESSMENT:   Acute on chronic renal failure  Hyperkalemia  Progressive cr, refractory volume excess.  PLAN:   HD and lytes per neprohology, once HD catheter is in will CVVH today.  GASTROINTESTINAL  ASSESSMENT:   Obesity  PLAN:   NPO incase we have to intubate, if tolerates dialysis the may start diet. PPI. Tube feeds if intubated.  HEMATOLOGIC  ASSESSMENT:   Chronic anemia  Coumadin induced coagulopathy: got FFP and Vit K No evidence of bleeding  PLAN:  Monitor CBC. Correct INR and follow up coags prior to placement of HD catheter.  INFECTIOUS  ASSESSMENT:   UTI 1/2 coag neg BC. Think this reflects contamination.   PLAN:   F/u repeat Cultures, if neg would d/c vanc  Cipro, would finish a 7 d total rx   ENDOCRINE  ASSESSMENT:   DM PLAN:   ssi  NEUROLOGIC  ASSESSMENT:   No acute issue  PLAN:   Supportive care   CLINICAL SUMMARY:  78 year old female w/ known h/o diastolic HF, CAF, and CRI. Now admitted for decomp heart failure and volume overload, complicated by progressive renal failure. We will place HD cath after coags corrected to allow for HD. Worried that her over all prognosis is poor and that she is at high risk for intubation.   I have personally obtained a history, examined the patient, evaluated laboratory and imaging results, formulated the assessment and plan and placed orders.  CRITICAL CARE: The patient is critically ill with multiple organ systems failure and requires high complexity  decision making for assessment and support, frequent evaluation and titration of therapies, application of advanced monitoring technologies and extensive interpretation of multiple databases. Critical Care Time devoted to patient care services described in this note is 35 minutes   Alyson Reedy, M.D. [facility] Pulmonary/Critical Care Medicine. Pager: [phone]. After hours pager: [phone].

## 2012-12-07 NOTE — Progress Notes (Signed)
Advanced Heart Failure Rounding Note   Subjective:    Desiree Pierce is a 78yo female with PMHx s/f chronic diastolic CHF, congenital solitary kidney, CKD (stage IV, baseline Cr 1.5-2.4), COPD + OSA + morbid obesity ->secondary pulmonary HTN, permanent atrial fibrillation, OSA, GERD who presents to [facility] ED with shortness of breath.   She last followed up in CHF clinic in 05/2012. Baseline weight 200-205 lbs.  She lives alone and no longer received home health services. She is quite limited by arthritis, and is essentially wheelchair bound.  10/2011 echo: LVEF 55-60%, moderate LVH, mod-severe biatrial enlargement, mild MR, mod-severe TR, normal RV size and function.   Admitted with progressive SOB and worsening LE edema.  POC TnI 0.02. pBNP 7488.0 .CXR reveals cardiomegaly, small R pleural effusion, otherwise unremarkable. BMET reveals BUN 41, Cr 2.14. Admit weight 232 pounds.  11/19/12 Started on Lasix 80 mg IV bid. 11/21/12 Continue on Lasix 80 mg IV tid and  Metolalzone 5 mg daily.   11/22/12 ECHO EF 60% Moderate LVH Mild Mitral regurgitation. +RV dysfunction  12/01/12: RHC RA = 21  RV = 80/11/19  PA = 88/34 (56)  PCW = 24 v = 35  Fick cardiac output/index = 4.9/2.5  PVR = 7.3 woods  FA sat = 96%  PA sat = 55%, 57%  Started on lasix gtt, sildenafil and milrinone post-cath.  Since that time developed progressive renal failure. Now anuric. Creatinine leveling off. Cr 2.42>3.07>3.23>3.77>4.38>4.85>4.71. Received multiple units of FFP yesterday for INR > 5 in anticipation of trialysis catheter placement.   Remains dyspneic. Otherwise no complaints.  ABG 7.35/51/50/83% on 4L -> started on BiPap. INR 1.9   12/01/12 Blood cx- staphyl coccus (couagulase negatvie).  12/02/12 Urine >100,000  Multiple morphytes.  Day #4 of Cipro/ Day #3 Vancomycin.   CVP:  30-35!  Objective:    Vital Signs:   Temp:  [95.5 F (35.3 C)-98 F (36.7 C)] 97.6 F (36.4 C) (12/17 0400) Pulse Rate:  [31-121] 92   (12/17 0500) Resp:  [11-28] 22  (12/17 0500) BP: (105-166)/(40-100) 160/74 mmHg (12/17 0500) SpO2:  [91 %-100 %] 97 % (12/17 0500) Weight:  [104.4 kg (230 lb 2.6 oz)] 104.4 kg (230 lb 2.6 oz) (12/17 0500) Last BM Date: 12/05/12  Weight change: Filed Weights   12/06/12 0405 12/06/12 0455 12/07/12 0500  Weight: 98 kg (216 lb 0.8 oz) 97.3 kg (214 lb 8.1 oz) 104.4 kg (230 lb 2.6 oz)    Intake/Output:   Intake/Output Summary (Last 24 hours) at 12/07/12 0618 Last data filed at 12/07/12 0500  Gross per 24 hour  Intake 3239.83 ml  Output    204 ml  Net 3035.83 ml     Physical Exam:  General:  Lying in bed.  Dyspneic at rest HEENT: normal Neck: supple. JVP to jaw. Carotids 2+ bilat; no bruits. No lymphadenopathy or thryomegaly appreciated.  Cor: PMI nondisplaced. Irregular rate & rhythm. No rubs, gallops or murmurs. Lungs: diminished. tachypneic Abdomen: soft, nontender, nondistended. No hepatosplenomegaly. No bruits or masses. Good bowel sounds. Extremities: no cyanosis, clubbing, rash, 1+ edema. RA changes Neuro: alert & orientedx3, cranial nerves grossly intact. moves all 4 extremities w/o difficulty. Affect pleasant.  GU: Foley  Telemetry: A fib 110-120  Labs: Basic Metabolic Panel:  Lab 12/07/12 1610 12/06/12 0450 12/05/12 0449 12/04/12 0454 12/03/12 0500  NA 143 141 142 143 143  K 4.4 5.0 5.5* 4.6 4.8  CL 97 99 100 100 100  CO2 28 27 27  30  30  GLUCOSE 73 94 114* 109* 118*  BUN 83* 83* 81* 76* 71*  CREATININE 4.71* 4.85* 4.38* 3.77* 3.23*  CALCIUM 9.5 9.2 9.3 -- --  MG 2.1 -- -- -- --  PHOS 5.9* 5.7* -- -- --    Liver Function Tests:  Lab 12/07/12 0450 12/06/12 0450  AST -- --  ALT -- --  ALKPHOS -- --  BILITOT -- --  PROT -- --  ALBUMIN 3.9 3.3*   No results found for this basename: LIPASE:5,AMYLASE:5 in the last 168 hours No results found for this basename: AMMONIA:3 in the last 168 hours  CBC:  Lab 12/07/12 0450 12/06/12 0450 12/05/12 0449 12/04/12  0454 12/03/12 0500  WBC 8.1 7.8 9.0 8.2 7.2  NEUTROABS -- -- -- -- --  HGB 7.7* 8.3* 8.3* 8.7* 9.5*  HCT 25.7* 27.6* 27.9* 28.9* 32.5*  MCV 87.4 87.3 87.7 88.1 89.5  PLT 169 204 182 180 185    Cardiac Enzymes: No results found for this basename: CKTOTAL:5,CKMB:5,CKMBINDEX:5,TROPONINI:5 in the last 168 hours  BNP: BNP (last 3 results)  Basename 11/19/12 1655  PROBNP 7488.0*     Medications:     Scheduled Medications:    . antiseptic oral rinse  15 mL Mouth Rinse BID  . ciprofloxacin  200 mg Intravenous Q12H  . diltiazem  360 mg Oral Daily  . senna-docusate  1 tablet Oral Daily  . sodium chloride  10 mL Intravenous Q12H  . vancomycin  1,000 mg Intravenous Q24H  . Warfarin - Pharmacist Dosing Inpatient   Does not apply q1800    Infusions:    . sodium chloride 10 mL/hr at 12/06/12 2000  . DOPamine 2.5 mcg/kg/min (12/06/12 2059)  . heparin 10,000 units/ 20 mL infusion syringe    . dialysis replacement fluid (prismasate)    . dialysis replacement fluid (prismasate)    . dialysate (PRISMASATE)      PRN Medications: acetaminophen, ALPRAZolam, heparin, heparin, heparin, ondansetron (ZOFRAN) IV, traMADol   Assessment:   1. Acute on chronic diastolic CHF  2. Permanent atrial fibrillation  3. Morbid obesity  4. Acute/Chronic renal failure, stage 4       --solitary kidney  5. OSA  6. Physical debility/deconditioning - wheelchair bound  7. Noncompliance  8. Hypertension, poorly controlled 9. UTI, cipro started 12/10  Plan/Discussion:    Creatinine levsled off. However she remains anuric. CVP markedly elevated and she is hypoxic. Will need to move ahead with CVVHD today. (appreciate Renal and CCM help). We will need to watch her respiratory status closely. She may need short course of intubation. Both her and her daughter understand that her kidney may not recover and we will have to make a decision about long-term dialysis. We will wait and see.   UTI - continue  cipro until foley is removed.  Continue vancomycin for 1/2 blood cultures Staph coagulase negative. Suspect this may be contaminant however.   AF rate elevated in setting of worsening dyspnea.  The patient is critically ill with multiple organ systems failure and requires high complexity decision making for assessment and support, frequent evaluation and titration of therapies, application of advanced monitoring technologies and extensive interpretation of multiple databases.   Critical Care Time devoted to patient care services described in this note is 35 Minutes.    Length of Stay: 42  Desiree Pierce 12/07/2012 6:18 AM

## 2012-12-07 NOTE — Progress Notes (Signed)
INITIAL NUTRITION ASSESSMENT  DOCUMENTATION CODES Per approved criteria  -Morbid Obesity   INTERVENTION: 1. Will add Nepro shakes PRN 2. If requires intubation, will need TF 3. RD will continue to follow    NUTRITION DIAGNOSIS: Inadequate oral intake related to poor appetite as evidenced by RN report.   Goal: Meet >/=90% estimated nutrition needs  Monitor:  ? Intubation, labs, CRRT, po intake, weight trends  Reason for Assessment: RN verbal consult   78 y.o. female  Admitting Dx: CHF/resiratory failure  ASSESSMENT: Pt assessed by RD on 12/9, pt was eating 100% at that time. Per RN, pt now with poor appetite, eating very little and requiring encouragement.  Pt with increased respiratory distress, continues to b volume overloaded and will need short term vs long term HD. Possible need for intubation.  Baseline weight per CHF MD, 195 lbs. Weight now 230 lbs.   Height: Ht Readings from Last 1 Encounters:  12/07/12 5\' 2"  (1.575 m)    Weight: Wt Readings from Last 1 Encounters:  12/07/12 230 lb 2.6 oz (104.4 kg)    Ideal Body Weight: 50 kg   % Ideal Body Weight: 208%  Wt Readings from Last 10 Encounters:  12/07/12 230 lb 2.6 oz (104.4 kg)  12/07/12 230 lb 2.6 oz (104.4 kg)  06/17/12 197 lb 1.9 oz (89.413 kg)  04/12/12 205 lb 1.9 oz (93.042 kg)  03/10/12 200 lb 8 oz (90.946 kg)  02/06/12 188 lb 8 oz (85.503 kg)  10/27/11 199 lb 8 oz (90.493 kg)  05/29/11 207 lb 12.8 oz (94.257 kg)  11/20/10 228 lb 8 oz (103.647 kg)  04/26/10 218 lb (98.884 kg)   Usual Body Weight: 195 lbs   % Usual Body Weight: 118%  BMI:  Body mass index is 42.10 kg/(m^2). Morbid Obesity   Estimated Nutritional Needs: Kcal: 2050-2300 Protein: 123-160 gm  Fluid: per team  Skin: intact   Diet Order: Sodium Restricted  EDUCATION NEEDS: -Education not appropriate at this time Will likely need some low sodium education in the future, if continues on HD will need that education as well.     Intake/Output Summary (Last 24 hours) at 12/07/12 1024 Last data filed at 12/07/12 1000  Gross per 24 hour  Intake 3733.63 ml  Output    237 ml  Net 3496.63 ml    Last BM: 12/15   Labs:   Lab 12/07/12 0450 12/06/12 0450 12/05/12 0449  NA 143 141 142  K 4.4 5.0 5.5*  CL 97 99 100  CO2 28 27 27   BUN 83* 83* 81*  CREATININE 4.71* 4.85* 4.38*  CALCIUM 9.5 9.2 9.3  MG 2.1 -- --  PHOS 5.9* 5.7* --  GLUCOSE 73 94 114*    CBG (last 3)  No results found for this basename: GLUCAP:3 in the last 72 hours  Scheduled Meds:   . antiseptic oral rinse  15 mL Mouth Rinse BID  . ciprofloxacin  200 mg Intravenous Q12H  . diltiazem  360 mg Oral Daily  . senna-docusate  1 tablet Oral Daily  . sodium chloride  10 mL Intravenous Q12H  . vancomycin  1,000 mg Intravenous Q24H  . Warfarin - Pharmacist Dosing Inpatient   Does not apply q1800    Continuous Infusions:   . sodium chloride 10 mL/hr at 12/06/12 2000  . DOPamine 2.5 mcg/kg/min (12/06/12 2059)  . heparin 10,000 units/ 20 mL infusion syringe    . dialysis replacement fluid (prismasate)    . dialysis replacement  fluid (prismasate)    . dialysate [facility])      Past Medical History  Diagnosis Date  . Morbid obesity   . Atrial fibrillation   . Arthritis   . Allergy history unknown   . Hypertension     Poorly controlled  . CHF (congestive heart failure)   . Pulmonary hypertension   . GERD (gastroesophageal reflux disease)   . COPD (chronic obstructive pulmonary disease)   . OA (osteoarthritis)   . Solitary kidney, congenital     Past Surgical History  Procedure Date  . Total abdominal hysterectomy   . Total knee arthroplasty     Right knee  . Ankle fracture surgery     Right ankle repair      Clarene Duke RD, LDN Pager [phone] After Hours pager [phone]

## 2012-12-07 NOTE — Procedures (Signed)
Central Venous hemodialysis Catheter Insertion Procedure Note Taquila Leys [id number] June 19, 1931  Procedure: Insertion of Central Venous hemodialysis Catheter Indications: CRRT  Procedure Details Consent: Risks of procedure as well as the alternatives and risks of each were explained to the (patient/caregiver).  Consent for procedure obtained. Time Out: Verified patient identification, verified procedure, site/side was marked, verified correct patient position, special equipment/implants available, medications/allergies/relevent history reviewed, required imaging and test results available.  Performed Pre-oxygenated w/ 100%  Maximum sterile technique was used including antiseptics, cap, gloves, gown, hand hygiene, mask and sheet. Skin prep: Chlorhexidine; local anesthetic administered A antimicrobial bonded/coated triple lumen catheter was placed in the left internal jugular vein using the Seldinger technique. Real time [place] used to ID and cannulate the left IJ  Prepped and draped in the sitting position, due to dyspnea.  Head placed flat for cannulation of vein and cath insertion, after cath inserted, head of bed elevated again during suturing due to dyspnea.   Evaluation Blood flow good Complications: No apparent complications Patient did tolerate procedure well. Chest X-ray ordered to verify placement.  CXR: pending.  BABCOCK,PETE 12/07/2012, 11:18 AM  U/S used in placement.  Patient seen and examined, agree with above note.  I dictated the care and orders written for this patient under my direction.  Koren Bound, M.D. [phone]

## 2012-12-07 NOTE — Progress Notes (Signed)
Placed pt. On bipap as per order. Pt. Is on auto titrate and is tolerating well at this time.

## 2012-12-08 LAB — POCT ACTIVATED CLOTTING TIME
Activated Clotting Time: 187 seconds
Activated Clotting Time: 187 seconds
Activated Clotting Time: 187 seconds
Activated Clotting Time: 198 seconds
Activated Clotting Time: 209 seconds

## 2012-12-08 LAB — CULTURE, BLOOD (ROUTINE X 2)

## 2012-12-08 LAB — CBC
HCT: 25 % — ABNORMAL LOW (ref 36.0–46.0)
MCV: 88.7 fL (ref 78.0–100.0)
RDW: 17.2 % — ABNORMAL HIGH (ref 11.5–15.5)
WBC: 4.8 10*3/uL (ref 4.0–10.5)

## 2012-12-08 LAB — PREPARE FRESH FROZEN PLASMA: Unit division: 0

## 2012-12-08 LAB — APTT: aPTT: 79 seconds — ABNORMAL HIGH (ref 24–37)

## 2012-12-08 LAB — RENAL FUNCTION PANEL
Albumin: 3.6 g/dL (ref 3.5–5.2)
BUN: 54 mg/dL — ABNORMAL HIGH (ref 6–23)
CO2: 30 mEq/L (ref 19–32)
CO2: 29 mEq/L (ref 19–32)
Calcium: 9 mg/dL (ref 8.4–10.5)
Chloride: 100 mEq/L (ref 96–112)
Creatinine, Ser: 2.18 mg/dL — ABNORMAL HIGH (ref 0.50–1.10)
GFR calc Af Amer: 24 mL/min — ABNORMAL LOW (ref >90)
GFR calc non Af Amer: 20 mL/min — ABNORMAL LOW (ref >90)
Glucose, Bld: 100 mg/dL — ABNORMAL HIGH (ref 70–99)
Phosphorus: 4.1 mg/dL (ref 2.3–4.6)
Potassium: 4.2 mEq/L (ref 3.5–5.1)
Potassium: 4.2 mEq/L (ref 3.5–5.1)

## 2012-12-08 LAB — MAGNESIUM: Magnesium: 2.2 mg/dL (ref 1.5–2.5)

## 2012-12-08 MED ORDER — DEXTROSE 5 % IV SOLN
1.0000 g | INTRAVENOUS | Status: AC
  Administered 2012-12-09 – 2012-12-18 (×10): 1 g via INTRAVENOUS
  Filled 2012-12-08 (×10): qty 10

## 2012-12-08 MED ORDER — DEXTROSE 5 % IV SOLN
1.0000 g | INTRAVENOUS | Status: DC
  Filled 2012-12-08: qty 10

## 2012-12-08 MED ORDER — FERUMOXYTOL INJECTION 510 MG/17 ML
510.0000 mg | INTRAVENOUS | Status: AC
  Administered 2012-12-08 – 2012-12-11 (×2): 510 mg via INTRAVENOUS
  Filled 2012-12-08 (×3): qty 17

## 2012-12-08 MED ORDER — CEFTRIAXONE SODIUM 1 G IJ SOLR: 1.0000 g | INTRAMUSCULAR | Status: DC

## 2012-12-08 NOTE — Clinical Social Work Note (Signed)
Clinical Social Worker will continue to follow for discharge planning needs.   Rozetta Nunnery MSW, [employer] [phone]

## 2012-12-08 NOTE — Care Management Note (Addendum)
    Page 1 of 2   12/14/2012     4:13:28 PM   CARE MANAGEMENT NOTE 12/14/2012  Patient:  JAKEISHA, STRICKER   Account Number:  [id number]  Date Initiated:  11/21/2012  Documentation initiated by:  Darlyne Russian  Subjective/Objective Assessment:   Patient admitted with increasing SOB and lower extremity swelling. PMH:  CHF, COPD,CKD.     Action/Plan:   Progression of care and discharge planning   Anticipated DC Date:     Anticipated DC Plan:    In-house referral  Clinical Social Worker      DC Planning Services  CM consult      [facility] Choice  LONG TERM ACUTE CARE   Choice offered to / List presented to:             Status of service:  Completed, signed off Medicare Important Message given?   (If response is "NO", the following Medicare IM given date fields will be blank) Date Medicare IM given:   Date Additional Medicare IM given:    Discharge Disposition:    Per UR Regulation:  Reviewed for med. necessity/level of care/duration of stay  If discussed at Long Length of Stay Meetings, dates discussed:   11/25/2012  11/30/2012  12/07/2012  12/09/2012  12/14/2012    Comments:  12/24 1611 DEBBIE Lacrecia Delval RN,BSN [facility] bed. dr Jesusita Oka feels [facility] may be better til permacath placed. have asked [facility] to eval for elidg. [facility] has offered bed. await nephr and vas  surg for guidance when maybe ready for ltac vs snf.  12/23 1342 debbie Zunaira Lamy rn,bsn for perm dialysis cath then to snf  12/18 1450p debbie Nidal Rivet rn,bsn spoke w amy clegg who has spoken w dr Jones Broom and he would like [facility] eval for poss ltac next week. spoke w mike at [facility] and he will do eval. pt on crt. md to speak w fam first about poss of [facility].  12/16 12:30p debbie Yaman Grauberger rn,bsn on iv dopamine. for snf at disch.  12/01/12 1446 Henrietta Mayo RN BSN MSN CCM Noted PT now recommending CIR.  Per pt, she was living with dtr who had a massive stroke and is now @ [facility]. Another dtr is  currently in [place] to assist with d/c planning - pt states dtr is on her way to the hospital. 1639 Met with pt and dtr @ bedside.  Dtr states she is from [place] and will be returning home, so pt will not have 24/7 assistance.  Pt was offered a bed @ [facility], and they both feel pt will be better served by x-ferring to [facility].  11/23/12 1327 Henrietta Mayo RN BSN MSN CCM PT recommends SNF, pt agrees.  CSW following and has given bed offers, pt to d/c to SNF when medically stable.

## 2012-12-08 NOTE — Progress Notes (Signed)
Advanced Heart Failure Rounding Note   Subjective:    Desiree Pierce is a 78yo female with PMHx s/f chronic diastolic CHF, congenital solitary kidney, CKD (stage IV, baseline Cr 1.5-2.4), COPD + OSA + morbid obesity ->secondary pulmonary HTN, permanent atrial fibrillation, OSA, GERD who presents to [facility] ED with shortness of breath.   She last followed up in CHF clinic in 05/2012. Baseline weight 200-205 lbs.  She lives alone and no longer received home health services. She is quite limited by arthritis, and is essentially wheelchair bound.  10/2011 echo: LVEF 55-60%, moderate LVH, mod-severe biatrial enlargement, mild MR, mod-severe TR, normal RV size and function.   Admitted with progressive SOB and worsening LE edema.  POC TnI 0.02. pBNP 7488.0 .CXR reveals cardiomegaly, small R pleural effusion, otherwise unremarkable. BMET reveals BUN 41, Cr 2.14. Admit weight 232 pounds.  11/19/12 Started on Lasix 80 mg IV bid. 11/21/12 Continue on Lasix 80 mg IV tid and  Metolalzone 5 mg daily.   11/22/12 ECHO EF 60% Moderate LVH Mild Mitral regurgitation. +RV dysfunction  12/01/12: RHC RA = 21  RV = 80/11/19  PA = 88/34 (56)  PCW = 24 v = 35  Fick cardiac output/index = 4.9/2.5  PVR = 7.3 woods  FA sat = 96%  PA sat = 55%, 57%  Started on lasix gtt, sildenafil and milrinone post-cath.  Since that time developed progressive renal failure.  12/01/12 Blood cx- staphyl coccus (couagulase negatvie).  12/02/12 Urine >100,000  Multiple morphytes.  Day #6 of Cipro/ Day #5 Vancomycin. Creatinine leveling off. Cr 2.42>3.07>3.23>3.77>4.38>4.85>4.71. Received multiple 9 units of FFP 12/05/12 for INR > 5 in anticipation of trialysis catheter placement. 12/06/12 Central Venous HD Catheter placed. Started CVVHD 12/05/12. Remains on Dopamine at 2.5 mcg.   Pulling 125cc/hr with CVVHD. Remains anuric. Respiratory status much improved.  Cr 2.42>3.07>3.23>3.77>4.38>4.85>4.71>4.15>2.97  Hemoglobin 9.5>8.3>7.7>7.5     CVP:  30  Objective:    Vital Signs:   Temp:  [95 F (35 C)-98 F (36.7 C)] 96.4 F (35.8 C) (12/18 0400) Pulse Rate:  [74-91] 80  (12/18 0700) Resp:  [11-21] 11  (12/18 0700) BP: (114-139)/(58-78) 129/68 mmHg (12/18 0700) SpO2:  [91 %-98 %] 94 % (12/18 0700) FiO2 (%):  [50 %-96 %] 50 % (12/18 0400) Weight:  [100.3 kg (221 lb 1.9 oz)] 100.3 kg (221 lb 1.9 oz) (12/18 0455) Last BM Date: 12/05/12  Weight change: Filed Weights   12/06/12 0455 12/07/12 0500 12/08/12 0455  Weight: 97.3 kg (214 lb 8.1 oz) 104.4 kg (230 lb 2.6 oz) 100.3 kg (221 lb 1.9 oz)    Intake/Output:   Intake/Output Summary (Last 24 hours) at 12/08/12 1216 Last data filed at 12/08/12 1200  Gross per 24 hour  Intake  889.3 ml  Output   2938 ml  Net -2048.7 ml     Physical Exam:  General:  Lying in bed.  Dyspnea improved. With [employer].  HEENT: normal Neck: supple. JVP to ear. Carotids 2+ bilat; no bruits. No lymphadenopathy or thryomegaly appreciated.  Cor: PMI nondisplaced. Irregular rate & rhythm. No rubs, gallops or murmurs. Lungs: diminished.  Abdomen: soft, nontender, nondistended. No hepatosplenomegaly. No bruits or masses. Good bowel sounds. Extremities: no cyanosis, clubbing, rash, 1+ edema. RA changes Neuro: alert & orientedx3, cranial nerves grossly intact. moves all 4 extremities w/o difficulty. Affect pleasant.  GU: Foley-urine brown with sediment  Telemetry: A fib 70-80s  Labs: Basic Metabolic Panel:  Lab 12/08/12 3086 12/07/12 1800 12/07/12 0450 12/06/12  0450 12/05/12 0449  NA 139 142 143 141 142  K 4.2 4.2 4.4 5.0 5.5*  CL 97 97 97 99 100  CO2 30 30 28 27 27   GLUCOSE 100* 89 73 94 114*  BUN 54* 75* 83* 83* 81*  CREATININE 2.97* 4.15* 4.71* 4.85* 4.38*  CALCIUM 9.0 9.2 9.5 -- --  MG 2.2 -- 2.1 -- --  PHOS 4.1 5.6* 5.9* 5.7* --    Liver Function Tests:  Lab 12/08/12 0420 12/07/12 1800 12/07/12 0450 12/06/12 0450  AST -- -- -- --  ALT -- -- -- --  ALKPHOS -- -- -- --   BILITOT -- -- -- --  PROT -- -- -- --  ALBUMIN 3.6 3.7 3.9 3.3*   No results found for this basename: LIPASE:5,AMYLASE:5 in the last 168 hours No results found for this basename: AMMONIA:3 in the last 168 hours  CBC:  Lab 12/08/12 0420 12/07/12 0450 12/06/12 0450 12/05/12 0449 12/04/12 0454  WBC 4.8 8.1 7.8 9.0 8.2  NEUTROABS -- -- -- -- --  HGB 7.5* 7.7* 8.3* 8.3* 8.7*  HCT 25.0* 25.7* 27.6* 27.9* 28.9*  MCV 88.7 87.4 87.3 87.7 88.1  PLT 150 169 204 182 180    Cardiac Enzymes: No results found for this basename: CKTOTAL:5,CKMB:5,CKMBINDEX:5,TROPONINI:5 in the last 168 hours  BNP: BNP (last 3 results)  Basename 11/19/12 1655  PROBNP 7488.0*     Medications:     Scheduled Medications:    . antiseptic oral rinse  15 mL Mouth Rinse BID  . cefTRIAXone (ROCEPHIN)  IV  1 g Intravenous Q24H  . diltiazem  360 mg Oral Daily  . ferumoxytol  510 mg Intravenous Q3 days  . senna-docusate  1 tablet Oral Daily  . sodium chloride  10 mL Intravenous Q12H    Infusions:    . sodium chloride 10 mL/hr at 12/06/12 2000  . DOPamine 2.5 mcg/kg/min (12/06/12 2059)  . heparin 10,000 units/ 20 mL infusion syringe 1,000 Units/hr (12/08/12 1200)  . dialysis replacement fluid (prismasate) 300 mL/hr at 12/08/12 0631  . dialysis replacement fluid (prismasate) 200 mL/hr at 12/07/12 1450  . dialysate (PRISMASATE) 1,500 mL/hr at 12/08/12 1138    PRN Medications: acetaminophen, ALPRAZolam, feeding supplement (NEPRO CARB STEADY), heparin, heparin, heparin, ondansetron (ZOFRAN) IV, traMADol   Assessment:   1. Acute on chronic diastolic CHF  2. Permanent atrial fibrillation  3. Morbid obesity  4. Acute/Chronic renal failure, stage 4       --solitary kidney  5. OSA  6. Physical debility/deconditioning - wheelchair bound  7. Noncompliance  8. Hypertension, poorly controlled 9. UTI, cipro started 12/10 10. Acute respiratory failure 11. Anemia -  Plan/Discussion:    Volume status  improving slowly with CVVHD but CVP still markedly elevated. Remains anuric. Continue CVVHD.  Watch hemoglobin closely as it is trending down. 9.5 5 days ago and now to 7.5. If continues to drop will transfuse tomorrow.  AF rate controlled.   Appreciate Renal and CCM care.     Length of Stay: 88  Desiree Pierce 12/08/2012 12:16 PM

## 2012-12-08 NOTE — Progress Notes (Signed)
Subjective:  Resting comfortable on nasal cannula today CRRT finally started yesterday after catheter placement No issues thus far with filters, etc Tolerating 50-75 ml/hour neg neg UF Minimal UOP Ate all bfast per RN  Objective Vital signs in last 24 hours: Filed Vitals:   12/08/12 0500 12/08/12 0600 12/08/12 0622 12/08/12 0700  BP: 125/61  138/69 129/68  Pulse: 74 78 78 80  Temp:      TempSrc:      Resp: 12 16 14 11   Height:      Weight:      SpO2: 94% 96% 96% 94%   Weight change: -4.1 kg (-9 lb 0.6 oz)  Intake/Output Summary (Last 24 hours) at 12/08/12 0945 Last data filed at 12/08/12 0800  Gross per 24 hour  Intake 1303.4 ml  Output   2570 ml  Net -1266.6 ml   CVP 30  Weights: (note 200-205 lb usual outpt wt before current decomp)  12/08/12          100.3 kg (221 lb)  12/07/12 0500 104.4 kg (230 lb 2.6 oz) -- -- SP  12/06/12 0455 97.3 kg (214 lb 8.1 oz) -- -- AP  12/06/12 0405 98 kg (216 lb 0.8 oz) -- -- LM  12/05/12 0200 96.2 kg (212 lb 1.3 oz) -- -- AP  12/04/12 0500 99.3 kg (218 lb 14.7 oz) -- -- [facility]  12/02/12 2332 97.3 kg (214 lb 8.1 oz) -- -- PH  12/01/12 0619 95.6 kg (210 lb)   Physical Exam:  Blood pressure 129/68, pulse 80, temperature 96.4 F (35.8 C), temperature source Axillary, resp. rate 11, height 5\' 2"  (1.575 m), weight 100.3 kg (221 lb 1.9 oz), SpO2 94.00%. Resting comfortable Persistent JVD to jaw angle Irreg 70's No rub Decreased BS 1=2+ edema Minimal urine in foley  Labs: Basic Metabolic Panel:  Lab 12/08/12 4010 12/07/12 1800 12/07/12 0450 12/06/12 0450 12/05/12 0449 12/04/12 0454 12/03/12 0500  NA 139 142 143 141 142 143 143  K 4.2 4.2 4.4 5.0 5.5* 4.6 4.8  CL 97 97 97 99 100 100 100  CO2 30 30 28 27 27 30 30   GLUCOSE 100* 89 73 94 114* 109* 118*  BUN 54* 75* 83* 83* 81* 76* 71*  CREATININE 2.97* 4.15* 4.71* 4.85* 4.38* 3.77* 3.23*  ALB -- -- -- -- -- -- --  CALCIUM 9.0 9.2 9.5 9.2 9.3 9.5 9.5  PHOS 4.1 5.6* 5.9* 5.7* -- -- --    Liver Function Tests:  Lab 12/08/12 0420 12/07/12 1800 12/07/12 0450  AST -- -- --  ALT -- -- --  ALKPHOS -- -- --  BILITOT -- -- --  PROT -- -- --  ALBUMIN 3.6 3.7 3.9  CBC:  Lab 12/08/12 0420 12/07/12 0450 12/06/12 0450 12/05/12 0449  WBC 4.8 8.1 7.8 9.0  NEUTROABS -- -- -- --  HGB 7.5* 7.7* 8.3* 8.3*  HCT 25.0* 25.7* 27.6* 27.9*  MCV 88.7 87.4 87.3 87.7  PLT 150 169 204 182   CBG:  Lab 12/01/12 1608  GLUCAP 205*   Results for Desiree, Pierce (MRN [id number]) as of 12/08/2012 09:52  Ref. Range 12/07/2012 04:50  Iron Latest Range: 42-135 ug/dL 41 (L)  UIBC Latest Range: 125-400 ug/dL 034  TIBC Latest Range: 250-470 ug/dL 742  Saturation Ratios Latest Range: 20-55 % 10 (L)  Ferritin Latest Range: 10-291 ng/mL 200   Studies/Results: Dg Chest Port 1 View  12/07/2012  *RADIOLOGY REPORT*  Clinical Data: Left internal jugular hemodialysis catheter placement  PORTABLE CHEST - 1 VIEW  Comparison: Portable exam 1147 hours compared to 12/06/2012  Findings: Right subclavian line stable tip projecting over SVC. New left jugular dual-lumen central venous catheter tip projecting over SVC. Enlargement of cardiac silhouette with pulmonary vascular congestion. Rotated to the left. Minimal atelectasis right base. Question minimal perihilar edema. No gross pleural effusion or pneumothorax.  IMPRESSION: No pneumothorax following left jugular line placement. Enlargement of cardiac silhouette with pulmonary vascular congestion and question minimal perihilar edema.   Original Report Authenticated By: Ulyses Southward, M.D.    Dg Chest Port 1 View  12/06/2012  *RADIOLOGY REPORT*  Clinical Data: Shortness of breath, history of atrial fibrillation  PORTABLE CHEST - 1 VIEW  Comparison: Portable chest x-ray of 12/05/2037  Findings: Prominent interstitial markings appear stable throughout the lungs and most likely are chronic.  Cardiomegaly also is stable.  Right central venous line tip overlies the lower SVC.  There are degenerative changes throughout the thoracic spine.  IMPRESSION: Probable chronic interstitial change.  Stable cardiomegaly and central line position.   Original Report Authenticated By: Dwyane Dee, M.D.    Medications:    . sodium chloride 10 mL/hr at 12/06/12 2000  . DOPamine 2.5 mcg/kg/min (12/06/12 2059)  . heparin 10,000 units/ 20 mL infusion syringe 1,000 Units/hr (12/08/12 0800)  . dialysis replacement fluid (prismasate) 300 mL/hr at 12/08/12 0631  . dialysis replacement fluid (prismasate) 200 mL/hr at 12/07/12 1450  . dialysate (PRISMASATE) 1,500 mL/hr at 12/08/12 0806      . antiseptic oral rinse  15 mL Mouth Rinse BID  . ciprofloxacin  200 mg Intravenous Q12H  . diltiazem  360 mg Oral Daily  . senna-docusate  1 tablet Oral Daily  . sodium chloride  10 mL Intravenous Q12H  . vancomycin  1,000 mg Intravenous Q24H  . Warfarin - Pharmacist Dosing Inpatient   Does not apply q1800    I  have reviewed scheduled and prn medications.  ASSESSMENT/RECOMMENDATIONS   78 y.o. female with PMHx sig for chronic diastolic CHF, congenital solitary kidney, CKD (stage IV, baseline Cr about 2mg /dl/Dr. Lowell Guitar), COPD + OSA + morbid obesity, secondary pulmonary HTN, permanent atrial fibrillation, OSA, GERD with oligoanuric AKI on CKD in setting of severe CHF/diuresis (but still marked vol overload) :   1. Acute Kidney Injury  She has such severe right heart issues, with CKD 4 at baseline in a solitary kidney, there is a fair chance she will not recover renal function  CRRT started 12/17 (left IJ temp cath 12/17) Tolerating 50-75/hr UF Phos and potassium OK Dr. Teressa Lower wishes CVP 15-18 or "as much fluid as we can safely get"  Increase goal to 75-125 as tolerated, still with goal CVP 15-18  2. Biventricular CHF w/ Pulmonary Hypertension (severe)  3. COPD with hypercarbia  4. Chronic A fib with coagulopathy from warfarin  5. Anemia with severe iron deficiency - Feraheme 510 iV X 2 (3  days apart) (start 12/18); ? Transfuse?   Camille Bal, MD [facility] [phone] pager 12/08/2012, 9:45 AM

## 2012-12-08 NOTE — Progress Notes (Signed)
Pt does not want to wear bi-pap at this time. She did wear for about 20 minutes last night and took off. She said tonight that she did not feel that she needed it, it is uncomfortable. RT will continue to monitor. RN has been notified.

## 2012-12-08 NOTE — Progress Notes (Signed)
PULMONARY  / CRITICAL CARE MEDICINE  Name: Desiree Pierce MRN: [id number] DOB: 09-Sep-1931    LOS: 19  REFERRING MD :   Bensimhon  CHIEF COMPLAINT:   Respiratory distress.   BRIEF PATIENT DESCRIPTION:  78 year old debilitated female admitted 12/9 with decompensated heart failure, volume overload and acute on chronic renal failure. PCCM asked to eval on 12/16 given concern for worsening respiratory failure.   LINES / TUBES: 11/29 right [place] CVL >>>  CULTURES: BC 12/11>>>Coag negative staph in blood UC 12/12 multiple bact/ >100K. MRSA PCR 11/29: positive  \ ANTIBIOTICS: vanc 12/12>>> cipro 12/10>>>  SIGNIFICANT EVENTS:  78yo female with PMHx s/f chronic diastolic CHF, congenital solitary kidney, CKD (stage IV, baseline Cr 1.5-2.4), COPD + OSA + morbid obesity ->secondary pulmonary HTN, permanent atrial fibrillation, OSA, GERD who presents to [facility] ED on 12/9 with shortness of breath. She last followed up in CHF clinic in 05/2012. Baseline weight 200-205 lbs. She lives alone and no longer received home health services. She is quite limited by arthritis, and is essentially wheelchair bound.  10/2011 echo: LVEF 55-60%, moderate LVH, mod-severe biatrial enlargement, mild MR, mod-severe TR. Had right heart cath on 12/11 showed severe PAH, volume overload, and RV dysfunction. PCCM. She had failed inotrope and diuretics. Developed progressive acute on chronic renal failure and worsening respiratory distress. PCCM asked to see on 12/16 for increased respiratory failure.   VITAL SIGNS: Temp:  [95 F (35 C)-98 F (36.7 C)] 96.4 F (35.8 C) (12/18 0400) Pulse Rate:  [74-91] 80  (12/18 0700) Resp:  [11-22] 11  (12/18 0700) BP: (114-155)/(58-78) 129/68 mmHg (12/18 0700) SpO2:  [91 %-98 %] 94 % (12/18 0700) FiO2 (%):  [50 %-96 %] 50 % (12/18 0400) Weight:  [100.3 kg (221 lb 1.9 oz)] 100.3 kg (221 lb 1.9 oz) (12/18 0455) HEMODYNAMICS: CVP:  [13 mmHg-39 mmHg] 28 mmHg VENTILATOR SETTINGS: Vent  Mode:  [-]  FiO2 (%):  [50 %-96 %] 50 % INTAKE / OUTPUT: Intake/Output      12/17 0701 - 12/18 0700 12/18 0701 - 12/19 0700   P.O. 610    I.V. (mL/kg) 352.8 (3.5)    Blood 300    IV Piggyback 500    Total Intake(mL/kg) 1762.8 (17.6)    Urine (mL/kg/hr) 265 (0.1)    Other 2240 350   Total Output 2505 350   Net -742.2 -350         PHYSICAL EXAMINATION: General:  Awake, alert, no focal def. Neuro:  WNL, no focal def. HEENT:  [place], ++ JVD in sitting position. Cardiovascular:  IV/VI SEM. Lungs:  Decreased t/o, no accessory muscle use. Abdomen:  Obese + bowel sounds. Musculoskeletal:  Intact. Skin:  + lower extremity edema.  LABS: Cbc  Lab 12/08/12 0420 12/07/12 0450 12/06/12 0450  WBC 4.8 -- --  HGB 7.5* 7.7* 8.3*  HCT 25.0* 25.7* 27.6*  PLT 150 169 204   Chemistry  Lab 12/08/12 0420 12/07/12 1800 12/07/12 0450  NA 139 142 143  K 4.2 4.2 4.4  CL 97 97 97  CO2 30 30 28   BUN 54* 75* 83*  CREATININE 2.97* 4.15* 4.71*  CALCIUM 9.0 9.2 9.5  MG 2.2 -- 2.1  PHOS 4.1 5.6* 5.9*  GLUCOSE 100* 89 73   Liver fxn  Lab 12/08/12 0420 12/07/12 1800 12/07/12 0450  AST -- -- --  ALT -- -- --  ALKPHOS -- -- --  BILITOT -- -- --  PROT -- -- --  ALBUMIN  3.6 3.7 3.9   coags  Lab 12/08/12 0420 12/07/12 0450 12/07/12 0005  APTT 79* 50* --  INR 1.57* 1.90* 1.92*   Sepsis markers No results found for this basename: LATICACIDVEN:3,PROCALCITON:3 in the last 168 hours Cardiac markers No results found for this basename: CKTOTAL:3,CKMB:3,TROPONINI:3 in the last 168 hours BNP No results found for this basename: PROBNP:3 in the last 168 hours ABG  Lab 12/07/12 0500 12/05/12 0045  PHART 7.357 7.322*  PCO2ART 51.2* 53.0*  PO2ART 49.9* 57.3*  HCO3 28.0* 26.7*  TCO2 29.6 28.3   CBG trend  Lab 12/01/12 1608  GLUCAP 205*   IMAGING: PCXR: volume excess, Right > left volume loss.  ECG:  DIAGNOSES: Active Problems:  Acute on chronic diastolic heart failure  Uncontrolled  hypertension  Acute on chronic renal failure  Acute renal failure  CKD (chronic kidney disease), stage IV  Acute respiratory failure  Warfarin-induced coagulopathy  Anemia  ASSESSMENT / PLAN:  PULMONARY  ASSESSMENT: Acute respiratory failure: in the setting of decompensated heart failure, renal failure and pulmonary edema. Currently her physical exam looks better than her abg MO, h/o OSA, prob OHS: this is complicating her current respiratory status.  PLAN:   Cont pulse ox. Avoid narcotics. BiPAP as needed.  CARDIOVASCULAR  ASSESSMENT:  Decompensated acute on chronic diastolic heart failure (milrinone and lasix stopped 12/11)  Pulmonary edema Chronic Atrial Fib (currently w/ CVR)  Severe secondary PAH (revatio stopped 12/12)  Cor pulmonale Followed by heart failure team. Presented w/ wt gain and volume excess. Not responding to diuretics and milrinone and got hypotensive.    PLAN:  Continue CRRT at 75-125 ml/hr. Continue dopamine at 2.5 mcg/kg/min.  RENAL  ASSESSMENT:   Acute on chronic renal failure  Hyperkalemia  Progressive cr, refractory volume excess.  PLAN:   Continue CRRT at 75-125 ml/hr.  GASTROINTESTINAL  ASSESSMENT:   Obesity  PLAN:   Continue diet. PPI.  HEMATOLOGIC  ASSESSMENT:   Chronic anemia  Coumadin induced coagulopathy: got FFP and Vit K No evidence of bleeding  PLAN:  Monitor CBC. Would transfuse a single unit pRBC, but will defer to cards given CAD. Heparin with CRRT, will defer further anticoagulation to cardiology.  INFECTIOUS  ASSESSMENT:   UTI 1/2 coag neg BC. Think this reflects contamination.  PLAN:   F/u repeat Cultures. D/C vanc since coag negative staph, start rocephin. D/C cipro and continue rocephin for 10 days total.  ENDOCRINE  ASSESSMENT:   DM PLAN:   SSI  NEUROLOGIC  ASSESSMENT:   No acute issue  PLAN:   Supportive care   Will need PT/OT once transition to HD.  Change abx as above.  Would  transfuse 1 unit pRBC.  CLINICAL SUMMARY:  78 year old female w/ known h/o diastolic HF, CAF, and CRI. Now admitted for decomp heart failure and volume overload, complicated by progressive renal failure. We will place HD cath after coags corrected to allow for HD. Worried that her over all prognosis is poor and that she is at high risk for intubation.   I have personally obtained a history, examined the patient, evaluated laboratory and imaging results, formulated the assessment and plan and placed orders.  CRITICAL CARE: The patient is critically ill with multiple organ systems failure and requires high complexity decision making for assessment and support, frequent evaluation and titration of therapies, application of advanced monitoring technologies and extensive interpretation of multiple databases. Critical Care Time devoted to patient care services described in this note is 64  minutes   Alyson Reedy, M.D. [facility] Pulmonary/Critical Care Medicine. Pager: [phone]. After hours pager: [phone].

## 2012-12-09 DIAGNOSIS — D649 Anemia, unspecified: Secondary | ICD-10-CM

## 2012-12-09 LAB — RENAL FUNCTION PANEL
CO2: 28 mEq/L (ref 19–32)
Calcium: 9.2 mg/dL (ref 8.4–10.5)
Chloride: 100 mEq/L (ref 96–112)
GFR calc Af Amer: 32 mL/min — ABNORMAL LOW (ref >90)
GFR calc non Af Amer: 28 mL/min — ABNORMAL LOW (ref >90)
Glucose, Bld: 81 mg/dL (ref 70–99)
Potassium: 4.6 mEq/L (ref 3.5–5.1)
Sodium: 138 mEq/L (ref 135–145)

## 2012-12-09 LAB — POCT ACTIVATED CLOTTING TIME
Activated Clotting Time: 214 seconds
Activated Clotting Time: 225 seconds
Activated Clotting Time: 225 seconds

## 2012-12-09 LAB — PROTIME-INR: Prothrombin Time: 18.3 seconds — ABNORMAL HIGH (ref 11.6–15.2)

## 2012-12-09 LAB — APTT: aPTT: 121 seconds — ABNORMAL HIGH (ref 24–37)

## 2012-12-09 LAB — CBC
MCH: 26.5 pg (ref 26.0–34.0)
Platelets: 148 10*3/uL — ABNORMAL LOW (ref 150–400)
RBC: 3.06 MIL/uL — ABNORMAL LOW (ref 3.87–5.11)

## 2012-12-09 LAB — MAGNESIUM: Magnesium: 2.4 mg/dL (ref 1.5–2.5)

## 2012-12-09 MED ORDER — NEPRO/CARBSTEADY PO LIQD
237.0000 mL | Freq: Two times a day (BID) | ORAL | Status: DC
  Administered 2012-12-09 – 2012-12-19 (×16): 237 mL via ORAL
  Filled 2012-12-09 (×17): qty 237

## 2012-12-09 MED ORDER — DARBEPOETIN ALFA-POLYSORBATE 100 MCG/0.5ML IJ SOLN
100.0000 ug | INTRAMUSCULAR | Status: DC
  Administered 2012-12-09: 100 ug via SUBCUTANEOUS
  Filled 2012-12-09: qty 0.5

## 2012-12-09 NOTE — Progress Notes (Signed)
PULMONARY  / CRITICAL CARE MEDICINE  Name: Adelin Ventrella MRN: [id number] DOB: 01-Oct-1931    LOS: 20  REFERRING MD :   Bensimhon  CHIEF COMPLAINT:   Respiratory distress.   BRIEF PATIENT DESCRIPTION:  78 year old debilitated female admitted 12/9 with decompensated heart failure, volume overload and acute on chronic renal failure. PCCM asked to eval on 12/16 given concern for worsening respiratory failure.   LINES / TUBES: 11/29 right [place] CVL >>>  CULTURES: BC 12/11>>>Coag negative staph in blood UC 12/12 multiple bact/ >100K. MRSA PCR 11/29: positive  \ ANTIBIOTICS: vanc 12/12>>>12/18 cipro 12/10>>>12/18 Rocephin 12/18>>>12/22  SIGNIFICANT EVENTS:  78yo female with PMHx s/f chronic diastolic CHF, congenital solitary kidney, CKD (stage IV, baseline Cr 1.5-2.4), COPD + OSA + morbid obesity ->secondary pulmonary HTN, permanent atrial fibrillation, OSA, GERD who presents to [facility] ED on 12/9 with shortness of breath. She last followed up in CHF clinic in 05/2012. Baseline weight 200-205 lbs. She lives alone and no longer received home health services. She is quite limited by arthritis, and is essentially wheelchair bound.  10/2011 echo: LVEF 55-60%, moderate LVH, mod-severe biatrial enlargement, mild MR, mod-severe TR. Had right heart cath on 12/11 showed severe PAH, volume overload, and RV dysfunction. PCCM. She had failed inotrope and diuretics. Developed progressive acute on chronic renal failure and worsening respiratory distress. PCCM asked to see on 12/16 for increased respiratory failure.   VITAL SIGNS: Temp:  [94.6 F (34.8 C)-97.9 F (36.6 C)] 96.1 F (35.6 C) (12/19 0738) Pulse Rate:  [70-85] 84  (12/19 1000) Resp:  [12-20] 17  (12/19 1000) BP: (122-149)/(64-82) 143/74 mmHg (12/19 1000) SpO2:  [72 %-100 %] 94 % (12/19 1000) Weight:  [100.1 kg (220 lb 10.9 oz)] 100.1 kg (220 lb 10.9 oz) (12/19 0500) HEMODYNAMICS: CVP:  [27 mmHg-31 mmHg] 27 mmHg VENTILATOR SETTINGS:    INTAKE / OUTPUT: Intake/Output      12/18 0701 - 12/19 0700 12/19 0701 - 12/20 0700   P.O.  60   I.V. (mL/kg) 352.8 (3.5) 44.1 (0.4)   Blood     IV Piggyback     Total Intake(mL/kg) 352.8 (3.5) 104.1 (1)   Urine (mL/kg/hr) 34 (0)    Other 3240 489   Total Output 3274 489   Net -2921.2 -384.9         PHYSICAL EXAMINATION: General:  Awake, alert, no focal def. Neuro:  WNL, no focal def. HEENT:  [place], ++ JVD in sitting position. Cardiovascular:  IV/VI SEM. Lungs:  Decreased t/o, no accessory muscle use. Abdomen:  Obese + bowel sounds. Musculoskeletal:  Intact. Skin:  + lower extremity edema.  LABS: Cbc  Lab 12/09/12 0415 12/08/12 0420 12/07/12 0450  WBC 6.2 -- --  HGB 8.1* 7.5* 7.7*  HCT 27.2* 25.0* 25.7*  PLT 148* 150 169   Chemistry  Lab 12/09/12 0415 12/08/12 1628 12/08/12 0420 12/07/12 0450  NA 138 138 139 --  K 4.6 4.2 4.2 --  CL 100 100 97 --  CO2 28 29 30  --  BUN 27* 38* 54* --  CREATININE 1.69* 2.18* 2.97* --  CALCIUM 9.2 9.1 9.0 --  MG 2.4 -- 2.2 2.1  PHOS 2.5 3.0 4.1 --  GLUCOSE 81 92 100* --   Liver fxn  Lab 12/09/12 0415 12/08/12 1628 12/08/12 0420  AST -- -- --  ALT -- -- --  ALKPHOS -- -- --  BILITOT -- -- --  PROT -- -- --  ALBUMIN 3.5 3.6 3.6  coags  Lab 12/09/12 0415 12/08/12 0420 12/07/12 0450  APTT 121* 79* 50*  INR 1.57* 1.57* 1.90*   Sepsis markers No results found for this basename: LATICACIDVEN:3,PROCALCITON:3 in the last 168 hours Cardiac markers No results found for this basename: CKTOTAL:3,CKMB:3,TROPONINI:3 in the last 168 hours BNP No results found for this basename: PROBNP:3 in the last 168 hours ABG  Lab 12/07/12 0500 12/05/12 0045  PHART 7.357 7.322*  PCO2ART 51.2* 53.0*  PO2ART 49.9* 57.3*  HCO3 28.0* 26.7*  TCO2 29.6 28.3   CBG trend No results found for this basename: GLUCAP:5 in the last 168 hours IMAGING: PCXR: volume excess, Right > left volume loss.  ECG:  DIAGNOSES: Active Problems:  Acute on  chronic diastolic heart failure  Uncontrolled hypertension  Acute on chronic renal failure  Acute renal failure  CKD (chronic kidney disease), stage IV  Acute respiratory failure  Warfarin-induced coagulopathy  Anemia  ASSESSMENT / PLAN:  PULMONARY  ASSESSMENT: Acute respiratory failure: in the setting of decompensated heart failure, renal failure and pulmonary edema.  MO, h/o OSA, prob OHS: this is complicating her current respiratory status.  PLAN:   Cont pulse ox. Avoid narcotics. D/C BiPAP, patient is much improved.  CARDIOVASCULAR  ASSESSMENT:  Decompensated acute on chronic diastolic heart failure (milrinone and lasix stopped 12/11)  Pulmonary edema Chronic Atrial Fib (currently w/ CVR)  Severe secondary PAH (revatio stopped 12/12)  Cor pulmonale Followed by heart failure team. Presented w/ wt gain and volume excess. Not responding to diuretics and milrinone and got hypotensive.    PLAN:  Increased CRRT at 125-150 ml/hr. Continue dopamine at 2.5 mcg/kg/min. CVP remains 27-30 cmH2O.  RENAL  ASSESSMENT:   Acute on chronic renal failure  Hyperkalemia  Progressive cr, refractory volume excess.  PLAN:   Continue CRRT at 125-150 ml/hr.  GASTROINTESTINAL  ASSESSMENT:   Obesity  PLAN:   Continue diet. PPI.  HEMATOLOGIC  ASSESSMENT:   Chronic anemia  Coumadin induced coagulopathy: got FFP and Vit K No evidence of bleeding  PLAN:  Monitor CBC. Would transfuse a single unit pRBC, but will defer to cards given CAD. Heparin with CRRT, will defer further anticoagulation to cardiology.  INFECTIOUS  ASSESSMENT:   UTI 1/2 coag neg BC. Think this reflects contamination.  PLAN:   F/u repeat Cultures. D/C vanc since coag negative staph, start rocephin. D/C cipro and continue rocephin for 10 days total abx days last day should be 12/22.  ENDOCRINE  ASSESSMENT:   DM PLAN:   SSI  NEUROLOGIC  ASSESSMENT:   No acute issue  PLAN:   Supportive care    CLINICAL SUMMARY:  78 year old female w/ known h/o diastolic HF, CAF, and CRI. Now admitted for decomp heart failure and volume overload, complicated by progressive renal failure. We will place HD cath after coags corrected to allow for HD. Worried that her over all prognosis remains very poor.   I have personally obtained a history, examined the patient, evaluated laboratory and imaging results, formulated the assessment and plan and placed orders.  Alyson Reedy, M.D. [facility] Pulmonary/Critical Care Medicine. Pager: [phone]. After hours pager: [phone].

## 2012-12-09 NOTE — Plan of Care (Signed)
Problem: Phase I Progression Outcomes Goal: OOB as tolerated unless otherwise ordered Outcome: Not Progressing Patient placed on CVVH - unable to get out of due to dialysis. Limited mobility per patient report prior to hospitalization.   Problem: Phase II Progression Outcomes Goal: Tolerating diet Outcome: Not Progressing Patient has poor appetite. Needs to be feed by nursing staff due to  limit mobility in both hands.

## 2012-12-09 NOTE — Progress Notes (Signed)
Subjective:  Comfortable CRRT (12/17) Tolerating current UFR 3 liters off yesterday Minimal UOP CVP still 27-30  Objective Vital signs in last 24 hours: Filed Vitals:   12/09/12 0500 12/09/12 0600 12/09/12 0700 12/09/12 0738  BP: 140/80  129/76   Pulse: 77 73  79  Temp:    96.1 F (35.6 C)  TempSrc:    Oral  Resp: 19 18    Height:      Weight: 100.1 kg (220 lb 10.9 oz)     SpO2: 98% 72%  94%   Weight change: -0.2 kg (-7.1 oz)  Intake/Output Summary (Last 24 hours) at 12/09/12 0757 Last data filed at 12/09/12 0700  Gross per 24 hour  Intake  352.8 ml  Output   3274 ml  Net -2921.2 ml   Physical Exam:  Blood pressure 129/76, pulse 79, temperature 96.1 F (35.6 C), temperature source Oral, resp. rate 18, height 5\' 2"  (1.575 m), weight 100.1 kg (220 lb 10.9 oz), SpO2 94.00%.  Weights: (note 200-205 lb usual outpt wt before current decomp)   12/09/12  100.1 kg (220 lb)  12/08/12 100.3 kg (221 lb)  12/07/12 0500 104.4 kg (230 lb 2.6 oz) -- -- SP  12/06/12 0455 97.3 kg (214 lb 8.1 oz) -- -- AP  12/06/12 0405 98 kg (216 lb 0.8 oz) -- -- LM  12/05/12 0200 96.2 kg (212 lb 1.3 oz) -- -- AP  12/04/12 0500 99.3 kg (218 lb 14.7 oz) -- -- [facility]  12/02/12 2332 97.3 kg (214 lb 8.1 oz) -- -- [facility]  12/01/12 0619 95.6 kg (210 lb)   Persistent JVD to jaw angle  Irreg 70's  No rub  Decreased BS  1=2+ edema  Minimal urine in foley   Labs: Basic Metabolic Panel:  Lab 12/09/12 1610 12/08/12 1628 12/08/12 0420 12/07/12 1800 12/07/12 0450 12/06/12 0450 12/05/12 0449  NA 138 138 139 142 143 141 142  K 4.6 4.2 4.2 4.2 4.4 5.0 5.5*  CL 100 100 97 97 97 99 100  CO2 28 29 30 30 28 27 27   GLUCOSE 81 92 100* 89 73 94 114*  BUN 27* 38* 54* 75* 83* 83* 81*  CREATININE 1.69* 2.18* 2.97* 4.15* 4.71* 4.85* 4.38*  ALB -- -- -- -- -- -- --  CALCIUM 9.2 9.1 9.0 9.2 9.5 9.2 9.3  PHOS 2.5 3.0 4.1 5.6* 5.9* 5.7* --   Liver Function Tests:  Lab 12/09/12 0415 12/08/12 1628 12/08/12 0420  AST --  -- --  ALT -- -- --  ALKPHOS -- -- --  BILITOT -- -- --  PROT -- -- --  ALBUMIN 3.5 3.6 3.6  CBC:  Lab 12/09/12 0415 12/08/12 0420 12/07/12 0450 12/06/12 0450  WBC 6.2 4.8 8.1 7.8  NEUTROABS -- -- -- --  HGB 8.1* 7.5* 7.7* 8.3*  HCT 27.2* 25.0* 25.7* 27.6*  MCV 88.9 88.7 87.4 87.3  PLT 148* 150 169 204  CBG: No results found for this basename: GLUCAP:5 in the last 168 hours Results for SHERBY, MONCAYO (MRN [id number]) as of 12/09/2012 07:59  Ref. Range 12/07/2012 04:50  Iron Latest Range: 42-135 ug/dL 41 (L)  UIBC Latest Range: 125-400 ug/dL 119  TIBC Latest Range: 250-470 ug/dL 147  Saturation Ratios Latest Range: 20-55 % 10 (L)  Ferritin Latest Range: 10-291 ng/mL 200   Studies/Results: Dg Chest Port 1 View  12/07/2012  *RADIOLOGY REPORT*  Clinical Data: Left internal jugular hemodialysis catheter placement  PORTABLE CHEST - 1 VIEW  Comparison:  Portable exam 1147 hours compared to 12/06/2012  Findings: Right subclavian line stable tip projecting over SVC. New left jugular dual-lumen central venous catheter tip projecting over SVC. Enlargement of cardiac silhouette with pulmonary vascular congestion. Rotated to the left. Minimal atelectasis right base. Question minimal perihilar edema. No gross pleural effusion or pneumothorax.  IMPRESSION: No pneumothorax following left jugular line placement. Enlargement of cardiac silhouette with pulmonary vascular congestion and question minimal perihilar edema.   Original Report Authenticated By: Ulyses Southward, M.D.    Medications:    . sodium chloride 10 mL/hr at 12/09/12 0408  . DOPamine 2.5 mcg/kg/min (12/09/12 0405)  . heparin 10,000 units/ 20 mL infusion syringe 1,000 Units/hr (12/09/12 0700)  . dialysis replacement fluid (prismasate) 300 mL/hr at 12/09/12 0047  . dialysis replacement fluid (prismasate) 200 mL/hr at 12/07/12 1450  . dialysate (PRISMASATE) 1,500 mL/hr at 12/09/12 0524      . antiseptic oral rinse  15 mL Mouth Rinse BID   . cefTRIAXone (ROCEPHIN)  IV  1 g Intravenous Q24H  . diltiazem  360 mg Oral Daily  . ferumoxytol  510 mg Intravenous Q3 days  . senna-docusate  1 tablet Oral Daily  . sodium chloride  10 mL Intravenous Q12H    I  have reviewed scheduled and prn medications.  ASSESSMENT/RECOMMENDATIONS   78 y.o. female with PMHx sig for chronic diastolic CHF, congenital solitary kidney, CKD (stage IV, baseline Cr about 2mg /dl/Dr. Lowell Guitar), COPD + OSA + morbid obesity, secondary pulmonary HTN, permanent atrial fibrillation, OSA, GERD with oligoanuric AKI on CKD in setting of severe CHF/diuresis (but still marked vol overload) :   1. Acute Kidney Injury   She has such severe right heart issues, with CKD 4 at baseline in a solitary kidney, there is a fair chance she will  not recover renal function   CRRT started 12/17 (left IJ temp cath 12/17)   Tolerating 50-75/hr UF   Dr. Teressa Lower wishes CVP 15-18 or "as much fluid as we can safely get"   Increase goal to 125-150 net negative as tolerated, still with goal CVP 15-18  Agree with Dr. Teressa Lower that weights are likely not correct  2. Biventricular CHF w/ Pulmonary Hypertension (severe)  3. COPD with hypercarbia  4. Chronic A fib with coagulopathy from warfarin  5. Anemia with severe iron deficiency   Feraheme 510 iV X 2 (3 days apart) (start 12/18)   ? Transfuse?  Add Aranesp (100/wk  12/19)     Camille Bal, MD [facility] [phone] pager 12/09/2012, 7:57 AM

## 2012-12-09 NOTE — Plan of Care (Signed)
Problem: Phase I Progression Outcomes Goal: OOB as tolerated unless otherwise ordered Outcome: Not Progressing Patient remains on CVVHDF  Problem: Phase II Progression Outcomes Goal: Dialysis initiated Outcome: Completed/Met Date Met:  12/07/12 CVVHDF initiated 12/07/2012

## 2012-12-09 NOTE — Progress Notes (Signed)
Advanced Heart Failure Rounding Note   Subjective:    Desiree Pierce is a 78yo female with PMHx s/f chronic diastolic CHF ( Baseline weight 200-205 lbs.), congenital solitary kidney, CKD (stage IV, baseline Cr 1.5-2.4), COPD + OSA + morbid obesity ->secondary pulmonary HTN, permanent atrial fibrillation, OSA, GERD who presents to [facility] ED with shortness of breath.   She is quite limited by arthritis, and is essentially wheelchair bound.  Admitted with progressive SOB and worsening LE edema.. Admit weight 232 pounds.  11/19/12 Started on Lasix 80 mg IV bid. 11/21/12 Continue on Lasix 80 mg IV tid and  Metolalzone 5 mg daily.   11/22/12 ECHO EF 60% Moderate LVH Mild Mitral regurgitation. +RV dysfunction  12/01/12: RHC RA = 21  RV = 80/11/19  PA = 88/34 (56)  PCW = 24 v = 35  Fick cardiac output/index = 4.9/2.5  PVR = 7.3 woods  FA sat = 96%  PA sat = 55%, 57%  Started on lasix gtt, sildenafil and milrinone post-cath.  Since that time developed progressive renal failure.  12/01/12 Blood cx- staphyl coccus (couagulase negatvie).  12/02/12 Urine >100,000  Multiple morphytes.  Day #6 of Cipro/ Day #5 Vancomycin. Developed worsening renal failure.  Cr 2.42>3.07>3.23>3.77>4.38>4.85>4.71. Received multiple 9 units of FFP 12/05/12 for INR > 5 in anticipation of trialysis catheter placement. 12/06/12 Central Venous HD Catheter placed. Started CVVHD 12/05/12.  Pulling 150cc/hr with CVVHD. Remains anuric. Respiratory status much improved. Much more alert today. On [employer]. Hgb improved at 8.5. Weight unchanged   CVP remains  30 (checked personally)  Objective:    Vital Signs:   Temp:  [94.6 F (34.8 C)-97.9 F (36.6 C)] 97.5 F (36.4 C) (12/19 0400) Pulse Rate:  [70-84] 77  (12/19 0500) Resp:  [11-20] 19  (12/19 0500) BP: (115-149)/(64-94) 140/80 mmHg (12/19 0500) SpO2:  [94 %-100 %] 98 % (12/19 0500) Weight:  [100.1 kg (220 lb 10.9 oz)] 100.1 kg (220 lb 10.9 oz) (12/19 0500) Last BM Date:  12/05/12  Weight change: Filed Weights   12/07/12 0500 12/08/12 0455 12/09/12 0500  Weight: 104.4 kg (230 lb 2.6 oz) 100.3 kg (221 lb 1.9 oz) 100.1 kg (220 lb 10.9 oz)    Intake/Output:   Intake/Output Summary (Last 24 hours) at 12/09/12 0551 Last data filed at 12/09/12 0500  Gross per 24 hour  Intake  352.8 ml  Output   3151 ml  Net -2798.2 ml     Physical Exam:  General:  Lying in bed.  Dyspnea improved. With [employer].  HEENT: normal Neck: supple. JVP to ear. Carotids 2+ bilat; no bruits. No lymphadenopathy or thryomegaly appreciated. LIJ catheter Cor: PMI nondisplaced. Irregular rate & rhythm. No rubs, gallops or murmurs. Lungs: diminished.  Abdomen: soft, nontender, nondistended. No hepatosplenomegaly. No bruits or masses. Good bowel sounds. Extremities: no cyanosis, clubbing, rash, 1+ edema. RA changes Neuro: alert & orientedx3, cranial nerves grossly intact. moves all 4 extremities w/o difficulty. Affect pleasant.  GU: Foley-urine brown with sediment  Telemetry: A fib 70-80s  Labs: Basic Metabolic Panel:  Lab 12/09/12 1610 12/08/12 1628 12/08/12 0420 12/07/12 1800 12/07/12 0450  NA 138 138 139 142 143  K 4.6 4.2 4.2 4.2 4.4  CL 100 100 97 97 97  CO2 28 29 30 30 28   GLUCOSE 81 92 100* 89 73  BUN 27* 38* 54* 75* 83*  CREATININE 1.69* 2.18* 2.97* 4.15* 4.71*  CALCIUM 9.2 9.1 9.0 -- --  MG 2.4 -- 2.2 --  2.1  PHOS 2.5 3.0 4.1 5.6* 5.9*    Liver Function Tests:  Lab 12/09/12 0415 12/08/12 1628 12/08/12 0420 12/07/12 1800 12/07/12 0450  AST -- -- -- -- --  ALT -- -- -- -- --  ALKPHOS -- -- -- -- --  BILITOT -- -- -- -- --  PROT -- -- -- -- --  ALBUMIN 3.5 3.6 3.6 3.7 3.9   No results found for this basename: LIPASE:5,AMYLASE:5 in the last 168 hours No results found for this basename: AMMONIA:3 in the last 168 hours  CBC:  Lab 12/09/12 0415 12/08/12 0420 12/07/12 0450 12/06/12 0450 12/05/12 0449  WBC 6.2 4.8 8.1 7.8 9.0  NEUTROABS -- -- -- -- --  HGB  8.1* 7.5* 7.7* 8.3* 8.3*  HCT 27.2* 25.0* 25.7* 27.6* 27.9*  MCV 88.9 88.7 87.4 87.3 87.7  PLT 148* 150 169 204 182    Cardiac Enzymes: No results found for this basename: CKTOTAL:5,CKMB:5,CKMBINDEX:5,TROPONINI:5 in the last 168 hours  BNP: BNP (last 3 results)  Basename 11/19/12 1655  PROBNP 7488.0*     Medications:     Scheduled Medications:    . antiseptic oral rinse  15 mL Mouth Rinse BID  . cefTRIAXone (ROCEPHIN)  IV  1 g Intravenous Q24H  . diltiazem  360 mg Oral Daily  . ferumoxytol  510 mg Intravenous Q3 days  . senna-docusate  1 tablet Oral Daily  . sodium chloride  10 mL Intravenous Q12H    Infusions:    . sodium chloride 10 mL/hr at 12/09/12 0408  . DOPamine 2.5 mcg/kg/min (12/09/12 0405)  . heparin 10,000 units/ 20 mL infusion syringe 1,000 Units/hr (12/09/12 0500)  . dialysis replacement fluid (prismasate) 300 mL/hr at 12/09/12 0047  . dialysis replacement fluid (prismasate) 200 mL/hr at 12/07/12 1450  . dialysate (PRISMASATE) 1,500 mL/hr at 12/09/12 0524    PRN Medications: acetaminophen, ALPRAZolam, feeding supplement (NEPRO CARB STEADY), heparin, heparin, heparin, ondansetron (ZOFRAN) IV, traMADol   Assessment:   1. Acute on chronic diastolic CHF  2. Permanent atrial fibrillation  3. Morbid obesity  4. Acute/Chronic renal failure, stage 4       --solitary kidney  5. OSA  6. Physical debility/deconditioning - wheelchair bound  7. Noncompliance  8. Hypertension, poorly controlled 9. UTI, cipro started 12/10 10. Acute respiratory failure 11. Anemia -  Plan/Discussion:    Volume status improving slowly with CVVHD but CVP still markedly elevated. Remains anuric. Continue CVVHD. BP tolerating well. Not sure weights are accurate but potentially could consider increasing fluid removal rate.   Hgb stabilized.   AF rate controlled. On heparin.  Appreciate Renal and CCM care.     Length of Stay: 20  [name] 12/09/2012 5:51  AM

## 2012-12-09 NOTE — Progress Notes (Signed)
PT Cancellation Note  Patient Details Name: Desiree Pierce MRN: [id number] DOB: [birth year]   Cancelled Treatment:    Reason Eval/Treat Not Completed: Patient not medically ready (Pt remains on CVVHD will check back monday)   [name], PT [phone]

## 2012-12-09 NOTE — Progress Notes (Signed)
NUTRITION FOLLOW UP  Intervention:   1. Continue Nepro BID 2. RD will continue to follow    Nutrition Dx:   Inadequate oral intake related to poor appetite as evidenced by RN report.    Goal:   Meet >/=90% estimated nutrition needs. Unmet   Monitor:   PO intake, weight trends, labs, education  Assessment:   Now has a permanent HD cath, on CRRT.  Pt reports poor appetite, did try the Nepro and likes them.  Will need HD diet education prior to d/c, not appropriate at this time.   Height: Ht Readings from Last 1 Encounters:  12/07/12 5\' 2"  (1.575 m)    Weight Status:   Wt Readings from Last 1 Encounters:  12/09/12 220 lb 10.9 oz (100.1 kg)    Re-estimated needs:  Kcal: 2000-2200 Protein: 125-145 gm  Fluid: 2-2.2 L   Skin: intact   Diet Order: Sodium Restricted   Intake/Output Summary (Last 24 hours) at 12/09/12 1526 Last data filed at 12/09/12 1500  Gross per 24 hour  Intake  522.8 ml  Output   3811 ml  Net -3288.2 ml    Last BM: 12/15   Labs:   Lab 12/09/12 0415 12/08/12 1628 12/08/12 0420 12/07/12 0450  NA 138 138 139 --  K 4.6 4.2 4.2 --  CL 100 100 97 --  CO2 28 29 30  --  BUN 27* 38* 54* --  CREATININE 1.69* 2.18* 2.97* --  CALCIUM 9.2 9.1 9.0 --  MG 2.4 -- 2.2 2.1  PHOS 2.5 3.0 4.1 --  GLUCOSE 81 92 100* --    CBG (last 3)  No results found for this basename: GLUCAP:3 in the last 72 hours  Scheduled Meds:   . antiseptic oral rinse  15 mL Mouth Rinse BID  . cefTRIAXone (ROCEPHIN)  IV  1 g Intravenous Q24H  . darbepoetin (ARANESP) injection - NON-DIALYSIS  100 mcg Subcutaneous Q Thu-1800  . diltiazem  360 mg Oral Daily  . feeding supplement (NEPRO CARB STEADY)  237 mL Oral BID BM  . ferumoxytol  510 mg Intravenous Q3 days  . senna-docusate  1 tablet Oral Daily  . sodium chloride  10 mL Intravenous Q12H    Continuous Infusions:   . sodium chloride 10 mL/hr at 12/09/12 0408  . DOPamine 2.5 mcg/kg/min (12/09/12 0405)  . heparin  10,000 units/ 20 mL infusion syringe 1,000 Units/hr (12/09/12 1500)  . dialysis replacement fluid (prismasate) 300 mL/hr at 12/09/12 0047  . dialysis replacement fluid (prismasate) 200 mL/hr at 12/07/12 1450  . dialysate (PRISMASATE) 1,500 mL/hr at 12/09/12 0858    Clarene Duke RD, LDN Pager [phone] After Hours pager [phone]

## 2012-12-10 LAB — RENAL FUNCTION PANEL
Albumin: 3.5 g/dL (ref 3.5–5.2)
Albumin: 3.4 g/dL — ABNORMAL LOW (ref 3.5–5.2)
BUN: 16 mg/dL (ref 6–23)
CO2: 28 mEq/L (ref 19–32)
Calcium: 9.2 mg/dL (ref 8.4–10.5)
Chloride: 100 mEq/L (ref 96–112)
Chloride: 100 mEq/L (ref 96–112)
Creatinine, Ser: 1.18 mg/dL — ABNORMAL HIGH (ref 0.50–1.10)
Creatinine, Ser: 1.04 mg/dL (ref 0.50–1.10)
GFR calc Af Amer: 49 mL/min — ABNORMAL LOW (ref >90)
GFR calc non Af Amer: 43 mL/min — ABNORMAL LOW (ref >90)
GFR calc non Af Amer: 50 mL/min — ABNORMAL LOW (ref >90)
Glucose, Bld: 123 mg/dL — ABNORMAL HIGH (ref 70–99)
Phosphorus: 2.1 mg/dL — ABNORMAL LOW (ref 2.3–4.6)
Potassium: 4.2 mEq/L (ref 3.5–5.1)
Potassium: 4.3 mEq/L (ref 3.5–5.1)
Sodium: 136 mEq/L (ref 135–145)

## 2012-12-10 LAB — CBC
HCT: 29.1 % — ABNORMAL LOW (ref 36.0–46.0)
Platelets: 156 10*3/uL (ref 150–400)
RBC: 3.21 MIL/uL — ABNORMAL LOW (ref 3.87–5.11)
RDW: 16.9 % — ABNORMAL HIGH (ref 11.5–15.5)
WBC: 6 10*3/uL (ref 4.0–10.5)

## 2012-12-10 LAB — POCT ACTIVATED CLOTTING TIME
Activated Clotting Time: 220 seconds
Activated Clotting Time: 203 seconds

## 2012-12-10 LAB — APTT: aPTT: 128 seconds — ABNORMAL HIGH (ref 24–37)

## 2012-12-10 LAB — PROTIME-INR: INR: 1.62 — ABNORMAL HIGH (ref 0.00–1.49)

## 2012-12-10 MED ORDER — HEPARIN SODIUM (PORCINE) 5000 UNIT/ML IJ SOLN
5000.0000 [IU] | Freq: Three times a day (TID) | INTRAMUSCULAR | Status: DC
  Administered 2012-12-10 – 2012-12-15 (×16): 5000 [IU] via SUBCUTANEOUS
  Filled 2012-12-10 (×19): qty 1

## 2012-12-10 MED ORDER — TEMAZEPAM 7.5 MG PO CAPS
7.5000 mg | ORAL_CAPSULE | Freq: Once | ORAL | Status: AC
  Administered 2012-12-10: 7.5 mg via ORAL
  Filled 2012-12-10: qty 1

## 2012-12-10 NOTE — Progress Notes (Signed)
Subjective:  Awake and alert Feels better Tolerating CRRT 4 liters off yesterday Weight coming down CVP still about 30  Objective Vital signs in last 24 hours: Filed Vitals:   12/10/12 0700 12/10/12 0800 12/10/12 0811 12/10/12 0900  BP: 114/75  123/67 116/64  Pulse: 63 64 63 66  Temp:      TempSrc:      Resp: 15 16 16 16   Height:      Weight:      SpO2: 94% 95% 96% 97%   Weight change: -3.8 kg (-8 lb 6 oz)  Intake/Output Summary (Last 24 hours) at 12/10/12 0958 Last data filed at 12/10/12 0900  Gross per 24 hour  Intake  678.1 ml  Output   4734 ml  Net -4055.9 ml   Physical Exam:  Blood pressure 116/64, pulse 66, temperature 97 F (36.1 C), temperature source Oral, resp. rate 16, height 5\' 2"  (1.575 m), weight 96.3 kg (212 lb 4.9 oz), SpO2 97.00%. CVP 30 Weights: (note 200-205 lb usual outpt wt before current decomp)  12/10/12   93 kg (212 lb) 12/09/12 100.1 kg (220 lb)  12/08/12 100.3 kg (221 lb)  12/07/12 0500 104.4 kg (230 lb   12/06/12 0455 97.3 kg (214 lb 8.1 oz) 12/06/12 0405 98 kg (216 lb 0.8 oz)  12/05/12 0200 96.2 kg (212 lb 1.3 oz)   12/04/12 0500 99.3 kg (218 lb 14.7 oz)  12/02/12 2332 97.3 kg (214 lb 8.1 oz) 12/01/12 0619 95.6 kg (210 lb)   Persistent JVD to jaw angle  Irreg 70's  No rub  Decreased BS  1+ edema improved Minimal dark urine in foley (5 ml/24 hrs)  Labs: Basic Metabolic Panel:  Lab 12/10/12 1610 12/09/12 0415 12/08/12 1628 12/08/12 0420 12/07/12 1800 12/07/12 0450 12/06/12 0450  NA 136 138 138 139 142 143 141  K 4.2 4.6 4.2 4.2 4.2 4.4 5.0  CL 100 100 100 97 97 97 99  CO2 28 28 29 30 30 28 27   GLUCOSE 123* 81 92 100* 89 73 94  BUN 16 27* 38* 54* 75* 83* 83*  CREATININE 1.18* 1.69* 2.18* 2.97* 4.15* 4.71* 4.85*  ALB -- -- -- -- -- -- --  CALCIUM 9.2 9.2 9.1 9.0 9.2 9.5 9.2  PHOS 2.1* 2.5 3.0 4.1 5.6* 5.9* 5.7*   Liver Function Tests:  Lab 12/10/12 0400 12/09/12 0415 12/08/12 1628  AST -- -- --  ALT -- -- --  ALKPHOS --  -- --  BILITOT -- -- --  PROT -- -- --  ALBUMIN 3.5 3.5 3.6   CBC:  Lab 12/10/12 0400 12/09/12 0415 12/08/12 0420 12/07/12 0450  WBC 6.0 6.2 4.8 8.1  NEUTROABS -- -- -- --  HGB 8.5* 8.1* 7.5* 7.7*  HCT 29.1* 27.2* 25.0* 25.7*  MCV 90.7 88.9 88.7 87.4  PLT 156 148* 150 169  CBG: No results found for this basename: GLUCAP:5 in the last 168 hours  Iron Studies:  Lab 12/07/12 0450  IRON 41*  TIBC 419  TRANSFERRIN --  FERRITIN 200   Studies/Results: No results found. Medications:    . sodium chloride 10 mL/hr at 12/09/12 0408  . DOPamine 2.5 mcg/kg/min (12/09/12 2000)  . heparin 10,000 units/ 20 mL infusion syringe 850 Units/hr (12/10/12 0900)  . dialysis replacement fluid (prismasate) 300 mL/hr at 12/09/12 2000  . dialysis replacement fluid (prismasate) 200 mL/hr at 12/07/12 1450  . dialysate (PRISMASATE) 1,500 mL/hr at 12/10/12 9604      . antiseptic oral  rinse  15 mL Mouth Rinse BID  . cefTRIAXone (ROCEPHIN)  IV  1 g Intravenous Q24H  . darbepoetin (ARANESP) injection - NON-DIALYSIS  100 mcg Subcutaneous Q Thu-1800  . diltiazem  360 mg Oral Daily  . feeding supplement (NEPRO CARB STEADY)  237 mL Oral BID BM  . ferumoxytol  510 mg Intravenous Q3 days  . heparin subcutaneous  5,000 Units Subcutaneous Q8H  . senna-docusate  1 tablet Oral Daily  . sodium chloride  10 mL Intravenous Q12H    I  have reviewed scheduled and prn medications.  ASSESSMENT/RECOMMENDATIONS  78 y.o. female with PMHx sig for chronic diastolic CHF, congenital solitary kidney, CKD (stage IV, baseline Cr about 2mg /dl/Dr. Lowell Guitar), COPD + OSA + morbid obesity, secondary pulmonary HTN, permanent atrial fibrillation, OSA, GERD with oligoanuric AKI on CKD in setting of severe CHF/diuresis (but still marked vol overload) :   1. Acute Kidney Injury   She has such severe right heart issues, with CKD 4 at baseline in a solitary kidney, there is a fair chance she will not recover renal function   CRRT  started 12/17 (left IJ temp cath 12/17)   Tolerating UFR 125-150/hour   Dr. Teressa Lower wished CVP 15-18 or "as much fluid as we can safely get"   I think chance of renal recovery is very slim  Will stop CRRT tonight  Evaluate in AM for trial of intermittent HD over the weekend to see if can even tolerate  Patient wants to try it - so will proceed and if cannot tolerate, the Palliative care would be appropriate 2. Biventricular CHF w/ Pulmonary Hypertension (severe)  3. COPD with hypercarbia  4. Chronic A fib with coagulopathy from warfarin  5. Anemia with severe iron deficiency   Feraheme 510 iV X 2 (3 days apart) (start 12/18)   ? Transfuse?   Aranesp (100/wk 12/19)      Camille Bal, MD [facility] [phone] pager 12/10/2012, 9:58 AM

## 2012-12-10 NOTE — Progress Notes (Signed)
Pt does not wish to wear CPAP tonight. Pt is tolerating well on 2L [place]. Sats 98%. Pt states that she does not like the machine. RT explain importance of machine, PT still refused. RT will continue to monitor. RN made aware.

## 2012-12-10 NOTE — Progress Notes (Signed)
Advanced Heart Failure Rounding Note   Subjective:    Desiree Pierce is a 78 yo female with PMHx s/f chronic diastolic CHF ( Baseline weight 200-205 lbs.), congenital solitary kidney, CKD (stage IV, baseline Cr 1.5-2.4), COPD + OSA + morbid obesity ->secondary pulmonary HTN, permanent atrial fibrillation, OSA, GERD who presents to [facility] ED with shortness of breath.   She is quite limited by arthritis, and is essentially wheelchair bound.  Admitted with progressive SOB and worsening LE edema.. Admit weight 232 pounds.  11/19/12 Started on Lasix 80 mg IV bid. 11/21/12 Continue on Lasix 80 mg IV tid and  Metolalzone 5 mg daily.   11/22/12 ECHO EF 60% Moderate LVH Mild Mitral regurgitation. +RV dysfunction  12/01/12: RHC RA = 21  RV = 80/11/19  PA = 88/34 (56)  PCW = 24 v = 35  Fick cardiac output/index = 4.9/2.5  PVR = 7.3 woods  FA sat = 96%  PA sat = 55%, 57%  Started on lasix gtt, sildenafil and milrinone post-cath.  Since that time developed progressive renal failure.  12/01/12 Blood cx- staphyl coccus (couagulase negatvie).  12/02/12 Urine >100,000  Multiple morphytes.  Day #6 of Cipro/ Day #5 Vancomycin. Developed worsening renal failure.  Cr 2.42>3.07>3.23>3.77>4.38>4.85>4.71. Received multiple 9 units of FFP 12/05/12 for INR > 5 in anticipation of trialysis catheter placement. 12/06/12 Central Venous HD Catheter placed. Started CVVHD 12/05/12.  Pulling 150cc/hr with CVVHD. Remains anuric.   Anxious over night. Last night she was given Xanax and Rrestoril. Drowsy this am. Hgb stable  at 8.5. Weight down 8 pounds. (down 18 pounds from initiation of CVVHD)   CVP remains  24-25 (checked personally)  Objective:    Vital Signs:   Temp:  [96.5 F (35.8 C)-97.2 F (36.2 C)] 97 F (36.1 C) (12/20 0400) Pulse Rate:  [63-86] 63  (12/20 0700) Resp:  [14-19] 15  (12/20 0700) BP: (114-146)/(56-90) 114/75 mmHg (12/20 0700) SpO2:  [90 %-98 %] 94 % (12/20 0700) Weight:  [96.3 kg (212 lb 4.9  oz)] 96.3 kg (212 lb 4.9 oz) (12/20 0500) Last BM Date: 12/05/12  Weight change: Filed Weights   12/08/12 0455 12/09/12 0500 12/10/12 0500  Weight: 100.3 kg (221 lb 1.9 oz) 100.1 kg (220 lb 10.9 oz) 96.3 kg (212 lb 4.9 oz)    Intake/Output:   Intake/Output Summary (Last 24 hours) at 12/10/12 0824 Last data filed at 12/10/12 0700  Gross per 24 hour  Intake  643.4 ml  Output   4501 ml  Net -3857.6 ml     Physical Exam:  General:  Lying in bed.  Dyspnea improved. With [employer]. Drowsy HEENT: normal Neck: supple. JVP to ear. Carotids 2+ bilat; no bruits. No lymphadenopathy or thryomegaly appreciated. LIJ catheter Cor: PMI nondisplaced. Irregular rate & rhythm. No rubs, gallops or murmurs. Lungs: diminished. On 2 liters [place] Abdomen: soft, nontender, nondistended. No hepatosplenomegaly. No bruits or masses. Sluggish bowel sounds. Extremities: no cyanosis, clubbing, rash, 1+ edema. RA changes Neuro: alert & orientedx3, cranial nerves grossly intact. moves all 4 extremities w/o difficulty. Affect pleasant. Intermittently confused GU: Foley-urine brown with sediment  Telemetry: A fib 70-80s  Labs: Basic Metabolic Panel:  Lab 12/10/12 1610 12/09/12 0415 12/08/12 1628 12/08/12 0420 12/07/12 1800 12/07/12 0450  NA 136 138 138 139 142 --  K 4.2 4.6 4.2 4.2 4.2 --  CL 100 100 100 97 97 --  CO2 28 28 29 30 30  --  GLUCOSE 123* 81 92 100* 89 --  BUN 16 27* 38* 54* 75* --  CREATININE 1.18* 1.69* 2.18* 2.97* 4.15* --  CALCIUM 9.2 9.2 9.1 -- -- --  MG 2.4 2.4 -- 2.2 -- 2.1  PHOS 2.1* 2.5 3.0 4.1 5.6* --    Liver Function Tests:  Lab 12/10/12 0400 12/09/12 0415 12/08/12 1628 12/08/12 0420 12/07/12 1800  AST -- -- -- -- --  ALT -- -- -- -- --  ALKPHOS -- -- -- -- --  BILITOT -- -- -- -- --  PROT -- -- -- -- --  ALBUMIN 3.5 3.5 3.6 3.6 3.7   No results found for this basename: LIPASE:5,AMYLASE:5 in the last 168 hours No results found for this basename: AMMONIA:3 in the last 168  hours  CBC:  Lab 12/10/12 0400 12/09/12 0415 12/08/12 0420 12/07/12 0450 12/06/12 0450  WBC 6.0 6.2 4.8 8.1 7.8  NEUTROABS -- -- -- -- --  HGB 8.5* 8.1* 7.5* 7.7* 8.3*  HCT 29.1* 27.2* 25.0* 25.7* 27.6*  MCV 90.7 88.9 88.7 87.4 87.3  PLT 156 148* 150 169 204    Cardiac Enzymes: No results found for this basename: CKTOTAL:5,CKMB:5,CKMBINDEX:5,TROPONINI:5 in the last 168 hours  BNP: BNP (last 3 results)  Basename 11/19/12 1655  PROBNP 7488.0*     Medications:     Scheduled Medications:    . antiseptic oral rinse  15 mL Mouth Rinse BID  . cefTRIAXone (ROCEPHIN)  IV  1 g Intravenous Q24H  . darbepoetin (ARANESP) injection - NON-DIALYSIS  100 mcg Subcutaneous Q Thu-1800  . diltiazem  360 mg Oral Daily  . feeding supplement (NEPRO CARB STEADY)  237 mL Oral BID BM  . ferumoxytol  510 mg Intravenous Q3 days  . senna-docusate  1 tablet Oral Daily  . sodium chloride  10 mL Intravenous Q12H    Infusions:    . sodium chloride 10 mL/hr at 12/09/12 0408  . DOPamine 2.5 mcg/kg/min (12/09/12 2000)  . heparin 10,000 units/ 20 mL infusion syringe 850 Units/hr (12/10/12 0600)  . dialysis replacement fluid (prismasate) 300 mL/hr at 12/09/12 2000  . dialysis replacement fluid (prismasate) 200 mL/hr at 12/07/12 1450  . dialysate (PRISMASATE) 1,500 mL/hr at 12/10/12 0613    PRN Medications: acetaminophen, ALPRAZolam, feeding supplement (NEPRO CARB STEADY), heparin, heparin, heparin, ondansetron (ZOFRAN) IV, traMADol   Assessment:   1. Acute on chronic diastolic CHF  2. Permanent atrial fibrillation  3. Morbid obesity  4. Acute/Chronic renal failure, stage 4       --solitary kidney  5. OSA  6. Physical debility/deconditioning - wheelchair bound  7. Noncompliance  8. Hypertension, poorly controlled 9. UTI, cipro started 12/10 10. Acute respiratory failure 11. Anemia -  Plan/Discussion:    Volume status improving slowly with CVVHD. CVP  down to 24.  Remains anuric.  Continue CVVHD for now. Prospect for renal recovery seems diminishingly small. Will need to discuss goals of care with her and family. Will ask Renal if they think she would even be candidate for HD.    Appreciate Renal and CCM care.    Length of Stay: 21 Daniel Bensimhon,MD 8:30 AM

## 2012-12-10 NOTE — Progress Notes (Signed)
Nursing: Patient has increase agitation and restlessness. No change in assessment. Patient reports generalized pain in hips. Ultram 50 mg PO given for pain. Xanax given for restlessness and agitation. Foley huddle performed with charge nurse Reymundo Poll and Biomedical scientist. Decision was made to remove urinary foley catheter. Additionally no urine out put reported since 1800 with only 8 ml of urine present. Internal balloon deflated ans 10 ml of clean solution removed. Patient instructed to take a deep breath during removal. Did as instructed. Catheter removed without difficulty and tip of catheter was intact. Peri care was completed and new bed pads applied. ELink nurse Loraine Leriche made aware of patient's current assessment and removal of urinary catheter. Will continue to assess.

## 2012-12-10 NOTE — Progress Notes (Signed)
PULMONARY  / CRITICAL CARE MEDICINE  Name: Desiree Pierce MRN: [id number] DOB: [birth year]    LOS: 21  REFERRING MD :   Bensimhon  CHIEF COMPLAINT:   Respiratory distress.   BRIEF PATIENT DESCRIPTION:  78 year old debilitated female admitted 12/9 with decompensated heart failure, volume overload and acute on chronic renal failure. PCCM asked to eval on 12/16 given concern for worsening respiratory failure.   LINES / TUBES: 11/29 right [place] CVL >>>  CULTURES: BC 12/11>>>Coag negative staph in blood UC 12/12 multiple bact/ >100K. MRSA PCR 11/29: positive  \ ANTIBIOTICS: vanc 12/12>>>12/18 cipro 12/10>>>12/18 Rocephin 12/18>>>12/22  SIGNIFICANT EVENTS:  78yo female with PMHx s/f chronic diastolic CHF, congenital solitary kidney, CKD (stage IV, baseline Cr 1.5-2.4), COPD + OSA + morbid obesity ->secondary pulmonary HTN, permanent atrial fibrillation, OSA, GERD who presents to [facility] ED on 12/9 with shortness of breath. She last followed up in CHF clinic in 05/2012. Baseline weight 200-205 lbs. She lives alone and no longer received home health services. She is quite limited by arthritis, and is essentially wheelchair bound.  10/2011 echo: LVEF 55-60%, moderate LVH, mod-severe biatrial enlargement, mild MR, mod-severe TR. Had right heart cath on 12/11 showed severe PAH, volume overload, and RV dysfunction. PCCM. She had failed inotrope and diuretics. Developed progressive acute on chronic renal failure and worsening respiratory distress. PCCM asked to see on 12/16 for increased respiratory failure.   VITAL SIGNS: Temp:  [96 F (35.6 C)-97.2 F (36.2 C)] 96 F (35.6 C) (12/20 0800) Pulse Rate:  [63-86] 63  (12/20 1000) Resp:  [14-20] 20  (12/20 1000) BP: (114-143)/(56-90) 118/61 mmHg (12/20 1000) SpO2:  [90 %-98 %] 97 % (12/20 1000) Weight:  [96.3 kg (212 lb 4.9 oz)] 96.3 kg (212 lb 4.9 oz) (12/20 0500) HEMODYNAMICS: CVP:  [21 mmHg-36 mmHg] 31 mmHg VENTILATOR SETTINGS:   INTAKE /  OUTPUT: Intake/Output      12/19 0701 - 12/20 0700 12/20 0701 - 12/21 0700   P.O. 270 240   I.V. (mL/kg) 338.1 (3.5) 64.1 (0.7)   IV Piggyback 50    Total Intake(mL/kg) 658.1 (6.8) 304.1 (3.2)   Urine (mL/kg/hr) 40 (0) 5   Other 4610 548   Total Output 4650 553   Net -3991.9 -248.9         PHYSICAL EXAMINATION: General:  Awake, alert, no focal def. Neuro:  WNL, no focal def. HEENT:  [place], ++ JVD in sitting position. Cardiovascular:  IV/VI SEM. Lungs:  Decreased t/o, no accessory muscle use. Abdomen:  Obese + bowel sounds. Musculoskeletal:  Intact. Skin:  + lower extremity edema.  LABS: Cbc  Lab 12/10/12 0400 12/09/12 0415 12/08/12 0420  WBC 6.0 -- --  HGB 8.5* 8.1* 7.5*  HCT 29.1* 27.2* 25.0*  PLT 156 148* 150   Chemistry  Lab 12/10/12 0400 12/09/12 0415 12/08/12 1628 12/08/12 0420  NA 136 138 138 --  K 4.2 4.6 4.2 --  CL 100 100 100 --  CO2 28 28 29  --  BUN 16 27* 38* --  CREATININE 1.18* 1.69* 2.18* --  CALCIUM 9.2 9.2 9.1 --  MG 2.4 2.4 -- 2.2  PHOS 2.1* 2.5 3.0 --  GLUCOSE 123* 81 92 --   Liver fxn  Lab 12/10/12 0400 12/09/12 0415 12/08/12 1628  AST -- -- --  ALT -- -- --  ALKPHOS -- -- --  BILITOT -- -- --  PROT -- -- --  ALBUMIN 3.5 3.5 3.6   coags  Lab 12/10/12 0400  12/09/12 0415 12/08/12 0420  APTT 128* 121* 79*  INR 1.62* 1.57* 1.57*   Sepsis markers No results found for this basename: LATICACIDVEN:3,PROCALCITON:3 in the last 168 hours Cardiac markers No results found for this basename: CKTOTAL:3,CKMB:3,TROPONINI:3 in the last 168 hours BNP No results found for this basename: PROBNP:3 in the last 168 hours ABG  Lab 12/07/12 0500 12/05/12 0045  PHART 7.357 7.322*  PCO2ART 51.2* 53.0*  PO2ART 49.9* 57.3*  HCO3 28.0* 26.7*  TCO2 29.6 28.3   CBG trend No results found for this basename: GLUCAP:5 in the last 168 hours IMAGING: PCXR: volume excess, Right > left volume loss.  ECG:  DIAGNOSES: Active Problems:  Acute on chronic  diastolic heart failure  Uncontrolled hypertension  Acute on chronic renal failure  Acute renal failure  CKD (chronic kidney disease), stage IV  Acute respiratory failure  Warfarin-induced coagulopathy  Anemia  ASSESSMENT / PLAN:  PULMONARY  ASSESSMENT: Acute respiratory failure: in the setting of decompensated heart failure, renal failure and pulmonary edema.  MO, h/o OSA, prob OHS: this is complicating her current respiratory status.  PLAN:   - Cont pulse ox. - Avoid narcotics as able.  CARDIOVASCULAR  ASSESSMENT:  Decompensated acute on chronic diastolic heart failure (milrinone and lasix stopped 12/11)  Pulmonary edema Chronic Atrial Fib (currently w/ CVR)  Severe secondary PAH (revatio stopped 12/12)  Cor pulmonale Followed by heart failure team. Presented w/ wt gain and volume excess. Not responding to diuretics and milrinone and got hypotensive.    PLAN:  - Increased CRRT at 125-150 ml/hr, will defer to renal. - Continue dopamine at 2.5 mcg/kg/min, will defer to cardiology. - CVP remains elevated.  RENAL  ASSESSMENT:   Acute on chronic renal failure  Hyperkalemia  Progressive cr, refractory volume excess.  PLAN:   Continue CRRT per renal.  GASTROINTESTINAL  ASSESSMENT:   Obesity  PLAN:   Continue diet. PPI.  HEMATOLOGIC  ASSESSMENT:   Chronic anemia  Coumadin induced coagulopathy: got FFP and Vit K No evidence of bleeding  PLAN:  Monitor CBC. Heparin with CRRT, will defer further anticoagulation to cardiology.  INFECTIOUS  ASSESSMENT:   UTI 1/2 coag neg BC. Think this reflects contamination.  PLAN:   F/u repeat Cultures. D/C vanc since coag negative staph, start rocephin. D/C cipro and continue rocephin for 10 days total abx days last day should be 12/22.  ENDOCRINE  ASSESSMENT:   DM PLAN:   SSI  NEUROLOGIC  ASSESSMENT:   No acute issue  PLAN:   Supportive care   CLINICAL SUMMARY:  78 year old female w/ known h/o diastolic  HF, CAF, and CRI. Now admitted for decomp heart failure and volume overload, complicated by progressive renal failure. We will place HD cath after coags corrected to allow for HD. Worried that her over all prognosis remains very poor.  Recommend NCB and palliation, I do not foresee patient going back to any reasonable quality of life.  PCCM will sign off, please call back if needed.  I have personally obtained a history, examined the patient, evaluated laboratory and imaging results, formulated the assessment and plan and placed orders.  Alyson Reedy, M.D. [facility] Pulmonary/Critical Care Medicine. Pager: [phone]. After hours pager: [phone].

## 2012-12-10 NOTE — Progress Notes (Signed)
eLink Physician-Brief Progress Note Patient Name: Desiree Pierce DOB: Apr 20, 1931 MRN: [id number]  Date of Service  12/10/2012   HPI/Events of Note  Insomnia   eICU Interventions  Restoril 7.5 mg po times one   Intervention Category Minor Interventions: Other: (insomnia)  [name] 12/10/2012, 1:47 AM

## 2012-12-11 LAB — RENAL FUNCTION PANEL
BUN: 18 mg/dL (ref 6–23)
CO2: 27 mEq/L (ref 19–32)
Chloride: 100 mEq/L (ref 96–112)
Creatinine, Ser: 1.65 mg/dL — ABNORMAL HIGH (ref 0.50–1.10)
Glucose, Bld: 107 mg/dL — ABNORMAL HIGH (ref 70–99)

## 2012-12-11 LAB — CBC
HCT: 29.3 % — ABNORMAL LOW (ref 36.0–46.0)
MCV: 91.3 fL (ref 78.0–100.0)
RDW: 17.3 % — ABNORMAL HIGH (ref 11.5–15.5)
WBC: 7.7 10*3/uL (ref 4.0–10.5)

## 2012-12-11 NOTE — Plan of Care (Signed)
Problem: Nutrition Goal: Nutritional status is improving Monitor and assess patient for malnutrition (ex- brittle hair, bruises, dry skin, pale skin and conjunctiva, muscle wasting, smooth red tongue, and disorientation). Collaborate with interdisciplinary team and initiate plan and interventions as ordered. Monitor patient's weight and dietary intake as ordered or per policy. Utilize nutrition screening tool and intervene per policy. Determine patient's food preferences and provide high-protein, high-caloric foods as appropriate.  Outcome: Not Progressing PT/OT consult requested.  Pt. Is unable to hold utensils d/t hand contractures.  She continues to insist on feeding herself (unsuccessfully) and refuses to be fed.  Consequently, her intake is poor and consists only of things that she can grab between her wrists such as part of a bagel at breakfast.

## 2012-12-11 NOTE — Progress Notes (Addendum)
Subjective:  Off CRRT since last evening No urine output No weight yet today Confused during the night with some agitation Now says "sleepy sleepy sleepy"  Objective Vital signs in last 24 hours: Filed Vitals:   12/11/12 0500 12/11/12 0600 12/11/12 0644 12/11/12 0700  BP: 117/61  127/57 131/65  Pulse: 77 74 77 80  Temp:      TempSrc:      Resp: 20 15 17 18   Height:      Weight:      SpO2: 97% 99% 97% 98%   Weight change:   Intake/Output Summary (Last 24 hours) at 12/11/12 0900 Last data filed at 12/11/12 0700  Gross per 24 hour  Intake 1563.4 ml  Output   2374 ml  Net -810.6 ml   CVP 22  Weights: (note 200-205 lb usual outpt wt before current decomp)   12/21 - pending 12/10/12 93 kg (212 lb)  12/09/12 100.1 kg (220 lb)  12/08/12 100.3 kg (221 lb)  12/07/12 0500 104.4 kg (230 lb)  12/06/12 0455 97.3 kg (214 lb 8.1 oz)  12/06/12 0405 98 kg (216 lb 0.8 oz)  12/05/12 0200 96.2 kg (212 lb 1.3 oz)  12/04/12 0500 99.3 kg (218 lb 14.7 oz)  12/02/12 2332 97.3 kg (214 lb 8.1 oz)  12/01/12 0619 95.6 kg (210 lb)   Physical Exam:  Blood pressure 131/65, pulse 80, temperature 96.9 F (36.1 C), temperature source Axillary, resp. rate 18, height 5\' 2"  (1.575 m), weight 96.3 kg (212 lb 4.9 oz), SpO2 98.00%. CVP 22 JVD to jaw angle Pleasant BF NAD Oriented to person and place but conversation a little tangential Left IJ HD cath in place Irreg S1S2 no S3 2/6 m left sternal border Abdomen soft without focal tenderness Woody changes/wrinkling LE's with improved but persistent edema  Labs: Basic Metabolic Panel:  Lab 12/11/12 1610 12/10/12 1600 12/10/12 0400 12/09/12 0415 12/08/12 1628 12/08/12 0420 12/07/12 1800  NA 136 137 136 138 138 139 142  K 4.7 4.3 4.2 4.6 4.2 4.2 4.2  CL 100 100 100 100 100 97 97  CO2 27 28 28 28 29 30 30   GLUCOSE 107* 91 123* 81 92 100* 89  BUN 18 12 16  27* 38* 54* 75*  CREATININE 1.65* 1.04 1.18* 1.69* 2.18* 2.97* 4.15*  ALB -- -- -- -- -- -- --   CALCIUM 9.2 9.1 9.2 9.2 9.1 9.0 9.2  PHOS 2.1* 1.8* 2.1* 2.5 3.0 4.1 5.6*   Liver Function Tests:  Lab 12/11/12 0441 12/10/12 1600 12/10/12 0400  AST -- -- --  ALT -- -- --  ALKPHOS -- -- --  BILITOT -- -- --  PROT -- -- --  ALBUMIN 3.2* 3.4* 3.5   CBC:  Lab 12/11/12 0441 12/10/12 0400 12/09/12 0415 12/08/12 0420  WBC 7.7 6.0 6.2 4.8  NEUTROABS -- -- -- --  HGB 8.7* 8.5* 8.1* 7.5*  HCT 29.3* 29.1* 27.2* 25.0*  MCV 91.3 90.7 88.9 88.7  PLT 164 156 148* 150   Iron Studies:  Lab 12/07/12 0450  IRON 41*  TIBC 419  TRANSFERRIN --  FERRITIN 200   Studies/Results: No results found. Medications:    . sodium chloride 10 mL/hr at 12/09/12 0408  . DOPamine 2.5 mcg/kg/min (12/10/12 2000)      . antiseptic oral rinse  15 mL Mouth Rinse BID  . cefTRIAXone (ROCEPHIN)  IV  1 g Intravenous Q24H  . darbepoetin (ARANESP) injection - NON-DIALYSIS  100 mcg Subcutaneous Q Thu-1800  .  diltiazem  360 mg Oral Daily  . feeding supplement (NEPRO CARB STEADY)  237 mL Oral BID BM  . ferumoxytol  510 mg Intravenous Q3 days  . heparin subcutaneous  5,000 Units Subcutaneous Q8H  . senna-docusate  1 tablet Oral Daily  . sodium chloride  10 mL Intravenous Q12H    I  have reviewed scheduled and prn medications.  ASSESSMENT/RECOMMENDATIONS   78 y.o. female with PMHx sig for chronic diastolic CHF, congenital solitary kidney, CKD (stage IV, baseline Cr about 2mg /dl/Dr. Lowell Guitar), COPD + OSA + morbid obesity, secondary pulmonary HTN, permanent atrial fibrillation, OSA, GERD with oligoanuric AKI on CKD in setting of severe CHF/diuresis (but still marked vol overload) :   1. Acute Kidney Injury on CKD4   She has such severe right heart issues, with CKD 4 at baseline in a solitary kidney, there is a fair chance she will not recover    CRRT  12/17 - 12/20  (left IJ temp cath 12/17) stopped last evening  No urine output   I think chance of renal recovery is very slim   Plan trial of intermittent HD  - not needed today - will plan for tomorrow  to see if can even tolerate   Patient wants to try it - so will proceed and if cannot tolerate, the Palliative care would be appropriate  2. Biventricular CHF w/ Pulmonary Hypertension (severe)  3. COPD with hypercarbia  4. Chronic A fib with coagulopathy from warfarin  5. Anemia with severe iron deficiency   Feraheme 510 iV X 2 (3 days apart) (start 12/18)   Aranesp (100/wk 12/19)  6. Confusion/agitation  A little more clear than last night  Avoid sedatives   Camille Bal, MD [facility] [phone] pager 12/11/2012, 9:00 AM

## 2012-12-11 NOTE — Progress Notes (Signed)
Nursing: Patient still agitated and restless. Yelling out names of her children since 9pm. When asked why she is doing this she state" They need to go home, why wouldn't they go home?". Discussed with patient at lengths that family has gone home and reorient her to time. She is able to state place, and purpose. Since midnight she has asked to sit on side of bed. Discussed with patient that she has not been out of bed in several days and was limited mobility prior to admission. Two total person assist the patient was placed in bedside chair. Vital signs remain stable. Patient reported that sitting in the chair was "nice". Patient still yelling for daughter at times , appears to be drowsy. Lights remain on for safety reasons with television on as requested by patient. Mrs. Desiree Pierce given Nepro shake and consumed 150 ml of shake.

## 2012-12-11 NOTE — Progress Notes (Signed)
Advanced Heart Failure Rounding Note   Subjective:    Desiree Pierce is a 78 yo female with PMHx s/f chronic diastolic CHF ( Baseline weight 200-205 lbs.), congenital solitary kidney, CKD (stage IV, baseline Cr 1.5-2.4), COPD + OSA + morbid obesity ->secondary pulmonary HTN, permanent atrial fibrillation, OSA, GERD.Admitted with progressive SOB and worsening LE edema.. Admit weight 232 pounds.   She is quite limited by arthritis, and is essentially wheelchair bound.   11/22/12 ECHO EF 60% Moderate LVH Mild Mitral regurgitation. +RV dysfunction  12/01/12: RHC RA = 21  RV = 80/11/19  PA = 88/34 (56)  PCW = 24 v = 35  Fick cardiac output/index = 4.9/2.5  PVR = 7.3 woods  FA sat = 96%  PA sat = 55%, 57%  Started on lasix gtt, sildenafil and milrinone post-cath.  Since that time developed progressive renal failure.Developed worsening renal failure. Started CVVHD 12/05/12.  CVVHD stopped last night (12/20). Delirious last night. Last night she was given Xanax and Restoril. Drowsy this am but alert and oriented. Remains anuric.   CVP 22 (checked personally)  Objective:    Vital Signs:   Temp:  [95 F (35 C)-98.8 F (37.1 C)] 96.9 F (36.1 C) (12/21 0313) Pulse Rate:  [63-81] 80  (12/21 0700) Resp:  [14-26] 18  (12/21 0700) BP: (105-133)/(55-67) 131/65 mmHg (12/21 0700) SpO2:  [93 %-100 %] 98 % (12/21 0700) Last BM Date: 12/05/12  Weight change: Filed Weights   12/08/12 0455 12/09/12 0500 12/10/12 0500  Weight: 100.3 kg (221 lb 1.9 oz) 100.1 kg (220 lb 10.9 oz) 96.3 kg (212 lb 4.9 oz)    Intake/Output:   Intake/Output Summary (Last 24 hours) at 12/11/12 0854 Last data filed at 12/11/12 0700  Gross per 24 hour  Intake 1578.1 ml  Output   2567 ml  Net -988.9 ml     Physical Exam:  General:  sitting in chair HEENT: normal Neck: supple. JVP to ear. Carotids 2+ bilat; no bruits. No lymphadenopathy or thryomegaly appreciated. LIJ catheter Cor: PMI nondisplaced. Irregular  rate & rhythm. No rubs, gallops or murmurs. Lungs: diminished. On 2 liters [place] Abdomen: soft, nontender, nondistended. No hepatosplenomegaly. No bruits or masses. Sluggish bowel sounds. Extremities: no cyanosis, clubbing, rash, Tr edema. RA changes Neuro: alert & orientedx3, cranial nerves grossly intact. moves all 4 extremities w/o difficulty. Affect pleasant. Intermittently confused  Telemetry: A fib 70-80s  Labs: Basic Metabolic Panel:  Lab 12/11/12 1914 12/10/12 1600 12/10/12 0400 12/09/12 0415 12/08/12 1628 12/08/12 0420 12/07/12 0450  NA 136 137 136 138 138 -- --  K 4.7 4.3 4.2 4.6 4.2 -- --  CL 100 100 100 100 100 -- --  CO2 27 28 28 28 29  -- --  GLUCOSE 107* 91 123* 81 92 -- --  BUN 18 12 16  27* 38* -- --  CREATININE 1.65* 1.04 1.18* 1.69* 2.18* -- --  CALCIUM 9.2 9.1 9.2 -- -- -- --  MG 2.5 -- 2.4 2.4 -- 2.2 2.1  PHOS 2.1* 1.8* 2.1* 2.5 3.0 -- --    Liver Function Tests:  Lab 12/11/12 0441 12/10/12 1600 12/10/12 0400 12/09/12 0415 12/08/12 1628  AST -- -- -- -- --  ALT -- -- -- -- --  ALKPHOS -- -- -- -- --  BILITOT -- -- -- -- --  PROT -- -- -- -- --  ALBUMIN 3.2* 3.4* 3.5 3.5 3.6   No results found for this basename: LIPASE:5,AMYLASE:5 in the last 168 hours No results  found for this basename: AMMONIA:3 in the last 168 hours  CBC:  Lab 12/11/12 0441 12/10/12 0400 12/09/12 0415 12/08/12 0420 12/07/12 0450  WBC 7.7 6.0 6.2 4.8 8.1  NEUTROABS -- -- -- -- --  HGB 8.7* 8.5* 8.1* 7.5* 7.7*  HCT 29.3* 29.1* 27.2* 25.0* 25.7*  MCV 91.3 90.7 88.9 88.7 87.4  PLT 164 156 148* 150 169    Cardiac Enzymes: No results found for this basename: CKTOTAL:5,CKMB:5,CKMBINDEX:5,TROPONINI:5 in the last 168 hours  BNP: BNP (last 3 results)  Basename 11/19/12 1655  PROBNP 7488.0*     Medications:     Scheduled Medications:    . antiseptic oral rinse  15 mL Mouth Rinse BID  . cefTRIAXone (ROCEPHIN)  IV  1 g Intravenous Q24H  . darbepoetin (ARANESP) injection -  NON-DIALYSIS  100 mcg Subcutaneous Q Thu-1800  . diltiazem  360 mg Oral Daily  . feeding supplement (NEPRO CARB STEADY)  237 mL Oral BID BM  . ferumoxytol  510 mg Intravenous Q3 days  . heparin subcutaneous  5,000 Units Subcutaneous Q8H  . senna-docusate  1 tablet Oral Daily  . sodium chloride  10 mL Intravenous Q12H    Infusions:    . sodium chloride 10 mL/hr at 12/09/12 0408  . DOPamine 2.5 mcg/kg/min (12/10/12 2000)    PRN Medications: acetaminophen, ALPRAZolam, feeding supplement (NEPRO CARB STEADY), traMADol   Assessment:   1. Acute on chronic diastolic CHF  2. Permanent atrial fibrillation  3. Morbid obesity  4. Acute/Chronic renal failure, stage 4       --solitary kidney  5. OSA  6. Physical debility/deconditioning - wheelchair bound  7. Noncompliance  8. Hypertension, poorly controlled 9. UTI, cipro started 12/10 10. Acute respiratory failure 11. Anemia - 12. Acute delirium  Plan/Discussion:    Volume status improved with CVVHD but CVP still in low 20s due to combination of RV dysfunction and restrictive CM. Remains anuric. I told her that it is very unlikely her kidney will recover. Will plan to start HD in next couple of days. She is Ok with this plan. Hopefully she will tolerate.  Would avoid sedatives. PT/OT consult placed.    Length of Stay: 22 Daniel Bensimhon,MD 8:54 AM

## 2012-12-12 LAB — MAGNESIUM: Magnesium: 2.4 mg/dL (ref 1.5–2.5)

## 2012-12-12 LAB — HEPATITIS B CORE ANTIBODY, TOTAL: Hep B Core Total Ab: NEGATIVE

## 2012-12-12 LAB — RENAL FUNCTION PANEL
Albumin: 3.1 g/dL — ABNORMAL LOW (ref 3.5–5.2)
CO2: 25 mEq/L (ref 19–32)
Chloride: 101 mEq/L (ref 96–112)
GFR calc Af Amer: 22 mL/min — ABNORMAL LOW (ref >90)
GFR calc non Af Amer: 19 mL/min — ABNORMAL LOW (ref >90)
Sodium: 137 mEq/L (ref 135–145)

## 2012-12-12 LAB — CBC
Hemoglobin: 8.6 g/dL — ABNORMAL LOW (ref 12.0–15.0)
RBC: 3.28 MIL/uL — ABNORMAL LOW (ref 3.87–5.11)
WBC: 8 10*3/uL (ref 4.0–10.5)

## 2012-12-12 LAB — HEPATITIS B SURFACE ANTIBODY,QUALITATIVE: Hep B S Ab: REACTIVE — AB

## 2012-12-12 LAB — PROTIME-INR
INR: 1.61 — ABNORMAL HIGH (ref 0.00–1.49)
Prothrombin Time: 18.6 seconds — ABNORMAL HIGH (ref 11.6–15.2)

## 2012-12-12 MED ORDER — DARBEPOETIN ALFA-POLYSORBATE 100 MCG/0.5ML IJ SOLN: 100.0000 ug | INTRAMUSCULAR | Status: DC

## 2012-12-12 MED ORDER — HEPARIN SODIUM (PORCINE) 1000 UNIT/ML IJ SOLN
1000.0000 [IU] | Freq: Once | INTRAMUSCULAR | Status: AC
  Administered 2012-12-12: 1000 [IU] via INTRAVENOUS

## 2012-12-12 MED ORDER — NA FERRIC GLUC CPLX IN SUCROSE 12.5 MG/ML IV SOLN
125.0000 mg | INTRAVENOUS | Status: DC
  Filled 2012-12-12 (×2): qty 10

## 2012-12-12 MED ORDER — DOXERCALCIFEROL 4 MCG/2ML IV SOLN: 2.0000 ug | INTRAVENOUS | Status: DC

## 2012-12-12 NOTE — Procedures (Signed)
I have personally attended this patient's dialysis session. Tolerating first hemo well.  1.9 liters off with 3 liter goal and BP 137/74.  Left IJ temporary catheter.     Desiree Pierce B

## 2012-12-12 NOTE — Progress Notes (Signed)
Dr, Eliott Nine aware no bmm x 5 days.

## 2012-12-12 NOTE — Progress Notes (Signed)
Subjective:  Patient off CRRT X 24 hours Getting first hemodialysis today Surprisingly well tolerated thus far Pre weight about 208 lbs  Objective Vital signs in last 24 hours: Filed Vitals:   12/12/12 0732 12/12/12 0800 12/12/12 0830 12/12/12 0900  BP: 126/75 136/80 132/72 137/74  Pulse: 84 89 89 91  Temp:      TempSrc:      Resp:  20 16 20   Height:      Weight:      SpO2: 100% 100%  100%   Weight change:   Intake/Output Summary (Last 24 hours) at 12/12/12 8295 Last data filed at 12/12/12 0200  Gross per 24 hour  Intake  404.7 ml  Output      0 ml  Net  404.7 ml   Physical Exam:  Blood pressure 137/74, pulse 91, temperature 97.8 F (36.6 C), temperature source Oral, resp. rate 20, height 5\' 2"  (1.575 m), weight 94.6 kg (208 lb 8.9 oz), SpO2 100.00%.  Weights: (note 200-205 lb usual outpt wt before current decomp)   12/12/12 94.6 kg             12/10/12 93 kg (212 lb)  12/09/12 100.1 kg (220 lb)  12/08/12 100.3 kg (221 lb)  12/07/12 0500 104.4 kg (230 lb)  12/06/12 0455 97.3 kg (214) 12/06/12 0405 98 kg (216) 12/05/12 0200 96.2 kg (212 lb)  12/04/12 0500 99.3 kg (218 lb   12/02/12 2332 97.3 kg (214 lb 12/01/12 0619 95.6 kg (210 lb)   JVD to jaw angle  Pleasant BF  NAD Oriented to person and place and situation Left IJ HD temp cath in place  Irreg S1S2 no S3 2/6 m left sternal border  Abdomen soft without focal tenderness  Woody changes/wrinkling LE's with improved but persistent edema   Labs: Basic Metabolic Panel:  Lab 12/12/12 6213 12/11/12 0441 12/10/12 1600 12/10/12 0400 12/09/12 0415 12/08/12 1628 12/08/12 0420  NA 137 136 137 136 138 138 139  K 4.4 4.7 4.3 4.2 4.6 4.2 4.2  CL 101 100 100 100 100 100 97  CO2 25 27 28 28 28 29 30   GLUCOSE 75 107* 91 123* 81 92 100*  BUN 28* 18 12 16  27* 38* 54*  CREATININE 2.33* 1.65* 1.04 1.18* 1.69* 2.18* 2.97*  ALB -- -- -- -- -- -- --  CALCIUM 9.5 9.2 9.1 9.2 9.2 9.1 9.0  PHOS 3.3 2.1* 1.8* 2.1* 2.5 3.0 4.1    Liver Function Tests:  Lab 12/12/12 0820 12/12/12 0345 12/11/12 0441 12/10/12 1600  AST -- -- -- --  ALT 21 -- -- --  ALKPHOS -- -- -- --  BILITOT -- -- -- --  PROT -- -- -- --  ALBUMIN -- 3.1* 3.2* 3.4*  CBC:  Lab 12/12/12 0345 12/11/12 0441 12/10/12 0400 12/09/12 0415  WBC 8.0 7.7 6.0 6.2  NEUTROABS -- -- -- --  HGB 8.6* 8.7* 8.5* 8.1*  HCT 30.2* 29.3* 29.1* 27.2*  MCV 92.1 91.3 90.7 88.9  PLT 147* 164 156 148*  CBG:  Lab 12/12/12 0826  GLUCAP 89    Iron Studies:  Lab 12/07/12 0450  IRON 41*  TIBC 419  TRANSFERRIN --  FERRITIN 200   Results for Desiree Pierce, Desiree Pierce (MRN [id number]) as of 12/12/2012 09:30  Ref. Range 12/07/2012 04:50  PTH Latest Range: 14.0-72.0 pg/mL 668.7 (H)   Studies/Results: No results found. Medications:    . sodium chloride Stopped (12/12/12 0100)      . antiseptic oral  rinse  15 mL Mouth Rinse BID  . cefTRIAXone (ROCEPHIN)  IV  1 g Intravenous Q24H  . darbepoetin (ARANESP) injection - NON-DIALYSIS  100 mcg Subcutaneous Q Thu-1800  . diltiazem  360 mg Oral Daily  . feeding supplement (NEPRO CARB STEADY)  237 mL Oral BID BM  . heparin subcutaneous  5,000 Units Subcutaneous Q8H  . senna-docusate  1 tablet Oral Daily  . sodium chloride  10 mL Intravenous Q12H    I  have reviewed scheduled and prn medications.  ASSESSMENT/RECOMMENDATIONS  78 y.o. female with PMHx sig for chronic diastolic CHF, congenital solitary kidney, CKD (stage IV, baseline Cr about 2mg /dl/Dr. Lowell Guitar), COPD + OSA + morbid obesity, secondary pulmonary HTN, permanent atrial fibrillation, OSA, GERD with oligoanuric AKI on CKD in setting of severe CHF/diuresis (but still marked vol overload)    1. Acute Kidney Injury on CKD4 (I suspect now new ESRD)  No urine output   I think chance of renal recovery is very slim   She has such severe right heart issues, with CKD 4 at baseline in a solitary kidney, there is a fair chance she will not recover   CRRT 12/17 - 12/20 (left IJ  temp cath 12/17)   First hemodialysis 12/22 - doing surprisingly well so far and patient wishes to continue  Next HD for Tuesday  Will need permcath this week  Will go ahead and get vein mapping done (ordered)/save left arm  If tolerates hemo, will need to get AVF or AVG (have not yet called [facility])   2. Biventricular CHF w/ Pulmonary Hypertension (severe)  3. COPD with hypercarbia  4. Chronic A fib with coagulopathy from warfarin  5. Anemia with severe iron deficiency   Feraheme 510 iV X 2 (3 days apart) (start 12/18)   Aranesp (100/wk 12/19) can give with HD now along with weekly ferrlecit (ordered for Tuesday HD) 6. Confusion/agitation   Much clearer today  Avoid sedatives 7. S/p staph coag negative + blood culture  S/p vanco 12/14-12/18  Now on rocephin (12/18)  ?when ATBS's to stop 8. CKD-MBD  Hyperparathyroidism - start Hectorol 2 mcg QHD  No binders needed at this time  Camille Bal, MD [facility] [phone] pager 12/12/2012, 9:22 AM

## 2012-12-12 NOTE — Progress Notes (Addendum)
Advanced Heart Failure Rounding Note   Subjective:    Ms. Mulkern is a 78 yo female with PMHx s/f chronic diastolic CHF ( Baseline weight 200-205 lbs.), congenital solitary kidney, CKD (stage IV, baseline Cr 1.5-2.4), COPD + OSA + morbid obesity ->secondary pulmonary HTN, permanent atrial fibrillation, OSA, GERD.Admitted with progressive SOB and worsening LE edema.. Admit weight 232 pounds.   She is quite limited by arthritis, and is essentially wheelchair bound.   11/22/12 ECHO EF 60% Moderate LVH Mild Mitral regurgitation. +RV dysfunction  12/01/12: RHC RA = 21  RV = 80/11/19  PA = 88/34 (56)  PCW = 24 v = 35  Fick cardiac output/index = 4.9/2.5  PVR = 7.3 woods  FA sat = 96%  PA sat = 55%, 57%  Started on lasix gtt, sildenafil and milrinone post-cath.  Since that time developed progressive renal failure.Developed worsening renal failure. CVVHD 12/05/12-12/20 . Patient seen in HD. Tolerating HD very well. 2.5L out. BP stable. Feels better. More alert. No SOB or orthopnea.  Objective:    Vital Signs:   Temp:  [96 F (35.6 C)-97.8 F (36.6 C)] 97.8 F (36.6 C) (12/22 0700) Pulse Rate:  [68-95] 93  (12/22 1000) Resp:  [13-21] 21  (12/22 1000) BP: (114-148)/(55-90) 148/80 mmHg (12/22 1000) SpO2:  [90 %-100 %] 100 % (12/22 0930) Weight:  [93.5 kg (206 lb 2.1 oz)-94.7 kg (208 lb 12.4 oz)] 94.6 kg (208 lb 8.9 oz) (12/22 0700) Last BM Date: 12/05/12  Weight change: Filed Weights   12/11/12 1100 12/12/12 0500 12/12/12 0700  Weight: 93.5 kg (206 lb 2.1 oz) 94.7 kg (208 lb 12.4 oz) 94.6 kg (208 lb 8.9 oz)    Intake/Output:   Intake/Output Summary (Last 24 hours) at 12/12/12 1027 Last data filed at 12/12/12 0200  Gross per 24 hour  Intake    390 ml  Output      0 ml  Net    390 ml     Physical Exam:  General:  sitting in chair HEENT: normal Neck: supple. JVP to ear. Carotids 2+ bilat; no bruits. No lymphadenopathy or thryomegaly appreciated. LIJ catheter Cor: PMI  nondisplaced. Irregular rate & rhythm. No rubs, gallops or murmurs. Lungs: diminished. On 2 liters [place] Abdomen: soft, nontender, nondistended. No hepatosplenomegaly. No bruits or masses. Sluggish bowel sounds. Extremities: no cyanosis, clubbing, rash, Tr edema. RA changes Neuro: alert & orientedx3, cranial nerves grossly intact. moves all 4 extremities w/o difficulty. Affect pleasant. Intermittently confused  Telemetry: A fib 70-80s  Labs: Basic Metabolic Panel:  Lab 12/12/12 1610 12/11/12 0441 12/10/12 1600 12/10/12 0400 12/09/12 0415 12/08/12 0420  NA 137 136 137 136 138 --  K 4.4 4.7 4.3 4.2 4.6 --  CL 101 100 100 100 100 --  CO2 25 27 28 28 28  --  GLUCOSE 75 107* 91 123* 81 --  BUN 28* 18 12 16  27* --  CREATININE 2.33* 1.65* 1.04 1.18* 1.69* --  CALCIUM 9.5 9.2 9.1 -- -- --  MG 2.4 2.5 -- 2.4 2.4 2.2  PHOS 3.3 2.1* 1.8* 2.1* 2.5 --    Liver Function Tests:  Lab 12/12/12 0820 12/12/12 0345 12/11/12 0441 12/10/12 1600 12/10/12 0400 12/09/12 0415  AST -- -- -- -- -- --  ALT 21 -- -- -- -- --  ALKPHOS -- -- -- -- -- --  BILITOT -- -- -- -- -- --  PROT -- -- -- -- -- --  ALBUMIN -- 3.1* 3.2* 3.4* 3.5 3.5  No results found for this basename: LIPASE:5,AMYLASE:5 in the last 168 hours No results found for this basename: AMMONIA:3 in the last 168 hours  CBC:  Lab 12/12/12 0345 12/11/12 0441 12/10/12 0400 12/09/12 0415 12/08/12 0420  WBC 8.0 7.7 6.0 6.2 4.8  NEUTROABS -- -- -- -- --  HGB 8.6* 8.7* 8.5* 8.1* 7.5*  HCT 30.2* 29.3* 29.1* 27.2* 25.0*  MCV 92.1 91.3 90.7 88.9 88.7  PLT 147* 164 156 148* 150    Cardiac Enzymes: No results found for this basename: CKTOTAL:5,CKMB:5,CKMBINDEX:5,TROPONINI:5 in the last 168 hours  BNP: BNP (last 3 results)  Basename 11/19/12 1655  PROBNP 7488.0*     Medications:     Scheduled Medications:    . antiseptic oral rinse  15 mL Mouth Rinse BID  . cefTRIAXone (ROCEPHIN)  IV  1 g Intravenous Q24H  . darbepoetin (ARANESP)  injection - DIALYSIS  100 mcg Intravenous Q Tue-HD  . diltiazem  360 mg Oral Daily  . doxercalciferol  2 mcg Intravenous Q T,Th,Sa-HD  . feeding supplement (NEPRO CARB STEADY)  237 mL Oral BID BM  . ferric gluconate (FERRLECIT/NULECIT) IV  125 mg Intravenous Q Tue-HD  . heparin subcutaneous  5,000 Units Subcutaneous Q8H  . senna-docusate  1 tablet Oral Daily  . sodium chloride  10 mL Intravenous Q12H    Infusions:    . sodium chloride Stopped (12/12/12 0100)    PRN Medications: acetaminophen, ALPRAZolam, feeding supplement (NEPRO CARB STEADY), traMADol   Assessment:   1. Acute on chronic diastolic CHF  2. Permanent atrial fibrillation  3. Morbid obesity  4. Acute/Chronic renal failure, stage 4       --solitary kidney  5. OSA  6. Physical debility/deconditioning - wheelchair bound  7. Noncompliance  8. Hypertension, poorly controlled 9. UTI, cipro started 12/10 10. Acute respiratory failure 11. Anemia - 12. Acute delirium  Plan/Discussion:    She is now dialysis dependent. Fortunately she is tolerating HD well. Will need Perm cath and permanent access placed this week. Then will plan d/c to SNF with arrangements for outpatient HD. Will d/w Case Manager in am. Appreciate Renal's care.   Will resume coumadin for AF after all access placed.  Length of Stay: 33 Daniel Bensimhon,MD 10:27 AM

## 2012-12-12 NOTE — Progress Notes (Signed)
SPOKE WITH PAM DAY NURSE IS ALSO AWARE DID NOT BRINGCVP PRESSURE BAG AND LINE UPSTAIRS. RT IJ HAS EXCELLENT BLOOD RETURN FLUSHED WELL AND CAPPED.

## 2012-12-13 DIAGNOSIS — N19 Unspecified kidney failure: Secondary | ICD-10-CM

## 2012-12-13 LAB — CBC
HCT: 29 % — ABNORMAL LOW (ref 36.0–46.0)
HCT: 29.9 % — ABNORMAL LOW (ref 36.0–46.0)
Hemoglobin: 8.5 g/dL — ABNORMAL LOW (ref 12.0–15.0)
MCH: 26.9 pg (ref 26.0–34.0)
MCH: 26.9 pg (ref 26.0–34.0)
MCHC: 29.3 g/dL — ABNORMAL LOW (ref 30.0–36.0)
MCHC: 29.1 g/dL — ABNORMAL LOW (ref 30.0–36.0)
MCV: 91.8 fL (ref 78.0–100.0)
MCV: 92.6 fL (ref 78.0–100.0)
Platelets: 94 10*3/uL — ABNORMAL LOW (ref 150–400)
RDW: 18 % — ABNORMAL HIGH (ref 11.5–15.5)
RDW: 18.6 % — ABNORMAL HIGH (ref 11.5–15.5)
WBC: 7.5 10*3/uL (ref 4.0–10.5)

## 2012-12-13 LAB — RENAL FUNCTION PANEL
Albumin: 3 g/dL — ABNORMAL LOW (ref 3.5–5.2)
BUN: 18 mg/dL (ref 6–23)
Calcium: 9.1 mg/dL (ref 8.4–10.5)
Creatinine, Ser: 1.85 mg/dL — ABNORMAL HIGH (ref 0.50–1.10)
Glucose, Bld: 80 mg/dL (ref 70–99)
Phosphorus: 2.6 mg/dL (ref 2.3–4.6)

## 2012-12-13 LAB — PROTIME-INR: INR: 1.5 — ABNORMAL HIGH (ref 0.00–1.49)

## 2012-12-13 MED ORDER — HEPARIN SODIUM (PORCINE) 1000 UNIT/ML DIALYSIS
100.0000 [IU]/kg | INTRAMUSCULAR | Status: DC | PRN
  Filled 2012-12-13: qty 10

## 2012-12-13 MED ORDER — NEPRO/CARBSTEADY PO LIQD
237.0000 mL | ORAL | Status: DC | PRN
  Filled 2012-12-13: qty 237

## 2012-12-13 MED ORDER — RENA-VITE PO TABS
1.0000 | ORAL_TABLET | Freq: Every day | ORAL | Status: DC
  Administered 2012-12-13 – 2012-12-18 (×5): 1 via ORAL
  Filled 2012-12-13 (×8): qty 1

## 2012-12-13 MED ORDER — HEPARIN SODIUM (PORCINE) 1000 UNIT/ML DIALYSIS: 1000.0000 [IU] | INTRAMUSCULAR | Status: DC | PRN

## 2012-12-13 MED ORDER — LIDOCAINE HCL (PF) 1 % IJ SOLN: 5.0000 mL | INTRAMUSCULAR | Status: DC | PRN

## 2012-12-13 MED ORDER — SODIUM CHLORIDE 0.9 % IV SOLN: 100.0000 mL | INTRAVENOUS | Status: DC | PRN

## 2012-12-13 MED ORDER — LIDOCAINE-PRILOCAINE 2.5-2.5 % EX CREA
1.0000 "application " | TOPICAL_CREAM | CUTANEOUS | Status: DC | PRN
  Filled 2012-12-13: qty 5

## 2012-12-13 MED ORDER — TRAMADOL HCL 50 MG PO TABS
50.0000 mg | ORAL_TABLET | Freq: Two times a day (BID) | ORAL | Status: DC
  Administered 2012-12-13 – 2012-12-19 (×12): 50 mg via ORAL
  Filled 2012-12-13 (×17): qty 1

## 2012-12-13 MED ORDER — PENTAFLUOROPROP-TETRAFLUOROETH EX AERO: 1.0000 "application " | INHALATION_SPRAY | CUTANEOUS | Status: DC | PRN

## 2012-12-13 MED ORDER — DARBEPOETIN ALFA-POLYSORBATE 150 MCG/0.3ML IJ SOLN
150.0000 ug | INTRAMUSCULAR | Status: DC
  Filled 2012-12-13: qty 0.3

## 2012-12-13 MED ORDER — DOXERCALCIFEROL 4 MCG/2ML IV SOLN
4.0000 ug | INTRAVENOUS | Status: DC
  Administered 2012-12-14: 4 ug via INTRAVENOUS
  Filled 2012-12-13 (×3): qty 2

## 2012-12-13 MED ORDER — DILTIAZEM HCL ER COATED BEADS 120 MG PO CP24
120.0000 mg | ORAL_CAPSULE | Freq: Every day | ORAL | Status: DC
  Administered 2012-12-13 – 2012-12-18 (×6): 120 mg via ORAL
  Filled 2012-12-13 (×7): qty 1

## 2012-12-13 MED ORDER — HEPARIN SODIUM (PORCINE) 1000 UNIT/ML DIALYSIS: 40.0000 [IU]/kg | Freq: Once | INTRAMUSCULAR | Status: DC

## 2012-12-13 MED ORDER — ALTEPLASE 2 MG IJ SOLR
2.0000 mg | Freq: Once | INTRAMUSCULAR | Status: AC | PRN
  Filled 2012-12-13: qty 2

## 2012-12-13 NOTE — Clinical Social Work Note (Signed)
Clinical Social Worker received consult for SNF placement and patient's preference for [facility]. CSW spoke with patient and confirmed that this facility is preference. CSW sent [facility] updated clinicals for review for placement. CSW spoke with Lowella Bandy at [facility] and they will need patient to be placed at their dialysis center for m, w, f and if she is ready for discharge tomorrow, then d/c summary will need be done before noon tomorrow. Lowella Bandy will review updated clinicals for possible admission. CSW will continue to follow.   Rozetta Nunnery MSW, [employer] [phone]

## 2012-12-13 NOTE — Progress Notes (Signed)
Subjective: Interval History: none.  Objective: Vital signs in last 24 hours: Temp:  [97.4 F (36.3 C)-98.6 F (37 C)] 98.1 F (36.7 C) (12/23 0730) Pulse Rate:  [82-97] 86  (12/23 0700) Resp:  [13-26] 17  (12/23 0700) BP: (116-155)/(52-90) 126/64 mmHg (12/23 0700) SpO2:  [99 %-100 %] 100 % (12/23 0700) Weight:  [91.7 kg (202 lb 2.6 oz)-91.9 kg (202 lb 9.6 oz)] 91.9 kg (202 lb 9.6 oz) (12/23 0500) Weight change: -1.8 kg (-3 lb 15.5 oz)  Intake/Output from previous day: 12/22 0701 - 12/23 0700 In: 810 [P.O.:710; IV Piggyback:100] Out: 3000  Intake/Output this shift:    General appearance: cooperative and moderately obese Back: rales in bases Cardio: S1, S2 normal and systolic murmur: holosystolic 2/6, blowing at apex GI: pos bs,obese, liver down 5 cm,  Extremities: edema 2+ Skin: peau d orange skin on abdm  Lab Results:  Basename 12/13/12 0400 12/12/12 0345  WBC 7.4 8.0  HGB 8.5* 8.6*  HCT 29.0* 30.2*  PLT 96* 147*   BMET:  Basename 12/13/12 0400 12/12/12 0345  NA 135 137  K 4.1 4.4  CL 97 101  CO2 29 25  GLUCOSE 80 75  BUN 18 28*  CREATININE 1.85* 2.33*  CALCIUM 9.1 9.5   No results found for this basename: PTH:2 in the last 72 hours Iron Studies: No results found for this basename: IRON,TIBC,TRANSFERRIN,FERRITIN in the last 72 hours  Studies/Results: No results found.  I have reviewed the patient's current medications.  Assessment/Plan: 1 CKD/AKI dis well on HD, will plan in am.  Vol xs, ? If making urine. Solute and acid base ok. Will see how progresses prior to access. 2 anemia will give epo and fe 3 HPTH ^ vit D 4 GERD 5 CM stabel 6 Hx AF lower Cardiazem with lower press 7 HTN not an issue 8 COPD  P HD,epo,fe, follow urine, educate, access    LOS: 24 days   Oluwadara Gorman L 12/13/2012,7:52 AM

## 2012-12-13 NOTE — Progress Notes (Signed)
Physical Therapy Treatment Patient Details Name: Desiree Pierce MRN: [id number] DOB: November 27, 1931 Today's Date: 12/13/2012 Time: 1478-2956 PT Time Calculation (min): 27 min  PT Assessment / Plan / Recommendation Comments on Treatment Session  pt rpesents with CHF and LE edema.  pt motivated for OOB, however very limited mobility requiring extensive 2 person A.      Follow Up Recommendations  SNF     Does the patient have the potential to tolerate intense rehabilitation     Barriers to Discharge        Equipment Recommendations  None recommended by PT    Recommendations for Other Services    Frequency Min 3X/week   Plan Discharge plan remains appropriate;Frequency remains appropriate    Precautions / Restrictions Precautions Precautions: Fall Restrictions Weight Bearing Restrictions: No   Pertinent Vitals/Pain Indicate pain in Bil knees with flexion secondary to arthritis per pt.      Mobility  Bed Mobility Bed Mobility: Supine to Sit;Sitting - Scoot to Edge of Bed Supine to Sit: 3: Mod assist;With rails;HOB elevated Sitting - Scoot to Edge of Bed: 2: Max assist Details for Bed Mobility Assistance: cues for UE sue, sequencing, encouragement.   Transfers Transfers: Sit to Stand;Stand to Sit;Stand Pivot Transfers Sit to Stand: 1: +2 Total assist;With upper extremity assist;From bed Sit to Stand: Patient Percentage: 30% Stand to Sit: 1: +2 Total assist;With upper extremity assist;To chair/3-in-1 Stand to Sit: Patient Percentage: 30% Stand Pivot Transfers: 1: +2 Total assist Stand Pivot Transfers: Patient Percentage: 30% Details for Transfer Assistance: Attempted to stand with RW, however pt unable to fully clear hips from bed.  Without RW pt able to SPT to chair with extensive A.  Cues for UE use, anterior wt shift with sit to stand and movement through pivot.   Ambulation/Gait Ambulation/Gait Assistance: Not tested (comment) Stairs: No Wheelchair Mobility Wheelchair  Mobility: No    Exercises     PT Diagnosis:    PT Problem List:   PT Treatment Interventions:     PT Goals Acute Rehab PT Goals Time For Goal Achievement: 12/15/12 Potential to Achieve Goals: Good PT Goal: Supine/Side to Sit - Progress: Progressing toward goal PT Goal: Sit to Stand - Progress: Progressing toward goal PT Transfer Goal: Bed to Chair/Chair to Bed - Progress: Progressing toward goal  Visit Information  Last PT Received On: 12/13/12 Assistance Needed: +2 PT/OT Co-Evaluation/Treatment: Yes Reason Eval/Treat Not Completed: Patient at procedure or test/unavailable    Subjective Data  Subjective: I'd like to get up.     Cognition  Overall Cognitive Status: Appears within functional limits for tasks assessed/performed Arousal/Alertness: Awake/alert Orientation Level: Appears intact for tasks assessed Behavior During Session: [facility] for tasks performed    Balance  Balance Balance Assessed: No  End of Session PT - End of Session Equipment Utilized During Treatment: Gait belt;Oxygen Activity Tolerance: Patient limited by pain;Patient limited by fatigue Patient left: in chair;with call bell/phone within reach Nurse Communication: Mobility status;Need for lift equipment   GP     Sunny Schlein, [place] [phone] 12/13/2012, 12:15 PM

## 2012-12-13 NOTE — Evaluation (Signed)
Occupational Therapy Evaluation Patient Details Name: Desiree Pierce MRN: [id number] DOB: 1931-05-02 Today's Date: 12/13/2012 Time: 4098-1191 OT Time Calculation (min): 69 min  OT Assessment / Plan / Recommendation Clinical Impression  Pt admitted 11/19/12 with SOB and edema, diagnosed with HF.   Pt has baseline arthritis, COPD, CKD.  Hospital course complicated by renal failure requiring HD and respiratory failure.  Pt required assist for ADL and mobility PTA.  Essentially, she was ambulating only short distances to the bathroom with a rollator.  Now requiring +2 assist for transfers.  Will need SNF upon d/c.  Will follow to address self feeding, ADL, and pre ADL skills.    OT Assessment  Patient needs continued OT Services    Follow Up Recommendations  SNF    Barriers to Discharge      Equipment Recommendations  None recommended by OT    Recommendations for Other Services    Frequency  Min 2X/week    Precautions / Restrictions Precautions Precautions: Fall Restrictions Weight Bearing Restrictions: No   Pertinent Vitals/Pain Chronic arthritic pain, knees, repositioned    ADL  Eating/Feeding: Performed;Other (comment);Minimal assistance (issued and instructed pt in use of u-cuff) Where Assessed - Eating/Feeding: Chair Grooming: Wash/dry face;Performed;Minimal assistance Where Assessed - Grooming: Supported sitting Upper Body Bathing: +1 Total assistance Where Assessed - Upper Body Bathing: Unsupported sitting Lower Body Bathing: +1 Total assistance Where Assessed - Lower Body Bathing: Rolling right and/or left Upper Body Dressing: Moderate assistance Where Assessed - Upper Body Dressing: Unsupported sitting Lower Body Dressing: +1 Total assistance Where Assessed - Lower Body Dressing: Supine, head of bed up Equipment Used: Gait belt;Other (comment) (universal cuff) Transfers/Ambulation Related to ADLs: +2 total assist pt 30% bed to chair. ADL Comments: Pt with goal of  self feeding    OT Diagnosis: Generalized weakness  OT Problem List: Decreased range of motion;Decreased activity tolerance;Decreased strength;Impaired balance (sitting and/or standing);Obesity;Pain;Impaired UE functional use;Cardiopulmonary status limiting activity OT Treatment Interventions: Self-care/ADL training;DME and/or AE instruction;Therapeutic activities;Therapeutic exercise;Patient/family education   OT Goals Acute Rehab OT Goals OT Goal Formulation: With patient Time For Goal Achievement: 12/27/12 Potential to Achieve Goals: Good ADL Goals Pt Will Perform Eating: with set-up;with assist to don/doff brace/orthosis;with adaptive utensils;Other (comment) (universal cuff) ADL Goal: Eating - Progress: Goal set today Pt Will Perform Grooming: with min assist;Sitting, chair;with adaptive equipment ADL Goal: Grooming - Progress: Goal set today Pt Will Transfer to Toilet: with 2+ total assist;with DME;3-in-1;Stand pivot transfer;Other (comment) (pt 50%) ADL Goal: Toilet Transfer - Progress: Goal set today Miscellaneous OT Goals Miscellaneous OT Goal #1: Pt will perform AROM B UEs all areas x 10 reps with rest breaks as needed. OT Goal: Miscellaneous Goal #1 - Progress: Goal set today Miscellaneous OT Goal #2: Pt will perform supine to sit at EOB with min assist, HOB flat in prep for ADL at EOB. OT Goal: Miscellaneous Goal #2 - Progress: Goal set today  Visit Information  Last OT Received On: 12/13/12 Assistance Needed: +2 PT/OT Co-Evaluation/Treatment: Yes    Subjective Data  Subjective: "I like being able to feed myself." Patient Stated Goal: Feed myself.   Prior Functioning     Home Living Lives With: Daughter Available Help at Discharge: Family;Available 24 hours/day Type of Home: House Home Access: Stairs to enter [employer] of Steps: 1 Entrance Stairs-Rails: None Home Layout: One level Bathroom Shower/Tub: Health visitor:  Standard Bathroom Accessibility: Yes How Accessible: Accessible via walker Home Adaptive Equipment: Bedside commode/3-in-1;Grab bars in shower;Shower chair  with back;Walker - four wheeled;Wheelchair - manual Prior Function Level of Independence: Independent with assistive device(s);Needs assistance Needs Assistance: Bathing;Meal Prep;Light Housekeeping;Gait;Transfers;Dressing Bath: Moderate Dressing: Moderate Meal Prep: Total Light Housekeeping: Total Gait Assistance: Min assist  Transfer Assistance: Was independent at one point, recently needs min assist Able to Take Stairs?: No Driving: No Vocation: Retired Musician: No difficulties Dominant Hand: Right         Vision/Perception     Cognition  Overall Cognitive Status: Appears within functional limits for tasks assessed/performed Arousal/Alertness: Awake/alert Orientation Level: Appears intact for tasks assessed Behavior During Session: [facility] for tasks performed    Extremity/Trunk Assessment Right Upper Extremity Assessment RUE ROM/Strength/Tone: Deficits RUE ROM/Strength/Tone Deficits: Decreased ROM - significant arthritic changes in hands and UE's; strength 4-/5 RUE Coordination: Deficits Left Upper Extremity Assessment LUE ROM/Strength/Tone: Deficits LUE ROM/Strength/Tone Deficits: Decreased ROM - significant arthritic changes in hands and UE's; strength 4-/5 LUE Coordination: Deficits     Mobility Bed Mobility Bed Mobility: Supine to Sit;Sitting - Scoot to Edge of Bed Supine to Sit: 3: Mod assist;With rails;HOB elevated Sitting - Scoot to Edge of Bed: 2: Max assist Details for Bed Mobility Assistance: cues for UE sue, sequencing, encouragement.   Transfers Sit to Stand: 1: +2 Total assist;With upper extremity assist;From bed Sit to Stand: Patient Percentage: 30% Stand to Sit: 1: +2 Total assist;With upper extremity assist;To chair/3-in-1 Stand to Sit: Patient Percentage: 30% Details for  Transfer Assistance: Attempted to stand with RW, however pt unable to fully clear hips from bed.  Without RW pt able to SPT to chair with extensive A.  Cues for UE use, anterior wt shift with sit to stand and movement through pivot.       Shoulder Instructions     Exercise     Balance Balance Balance Assessed: No   End of Session OT - End of Session Activity Tolerance: Patient limited by fatigue Patient left: in chair;with call bell/phone within reach Nurse Communication: Other (comment);Mobility status (use of universal cuff)  GO     Evern Bio 12/13/2012, 1:19 PM [phone]

## 2012-12-13 NOTE — Progress Notes (Signed)
PT/OT Cancellation Note  Patient Details Name: Soliyana Mcchristian MRN: [id number] DOB: 1931/09/17   Cancelled Treatment:    Reason Eval/Treat Not Completed: Patient at procedure or test/unavailable.  First attempt pt being fed breakfast, second attempt pt currently undergoing vein mapping.  Will f/u another time.     Sunny Schlein, [place] [phone] 12/13/2012, 9:23 AM

## 2012-12-13 NOTE — Progress Notes (Signed)
12/13/2012 Roselle Norton, OTR/L Pager: [phone] 

## 2012-12-13 NOTE — Progress Notes (Signed)
Advanced Heart Failure Rounding Note   Subjective:    Desiree Pierce is a 78 yo female with PMHx s/f chronic diastolic CHF ( Baseline weight 200-205 lbs.), congenital solitary kidney, CKD (stage IV, baseline Cr 1.5-2.4), COPD + OSA + morbid obesity ->secondary pulmonary HTN, permanent atrial fibrillation, OSA, GERD.Admitted with progressive SOB and worsening LE edema.. Admit weight 232 pounds.   She is quite limited by arthritis, and is essentially wheelchair bound.   11/22/12 ECHO EF 60% Moderate LVH Mild Mitral regurgitation. +RV dysfunction  12/01/12: RHC RA = 21  RV = 80/11/19  PA = 88/34 (56)  PCW = 24 v = 35  Fick cardiac output/index = 4.9/2.5  PVR = 7.3 woods  FA sat = 96%  PA sat = 55%, 57%  Started on lasix gtt, sildenafil and milrinone post-cath.  Since that time developed progressive renal failure.Developed worsening renal failure. CVVHD 12/05/12-12/20.  Started on HD on 12/22, tolerated well.  Patient complains of arthritis pain.  No dyspnea, orthopnea or PND.     Objective:    Vital Signs:   Temp:  [97.4 F (36.3 C)-98.6 F (37 C)] 98.1 F (36.7 C) (12/23 0730) Pulse Rate:  [82-97] 86  (12/23 0700) Resp:  [13-26] 17  (12/23 0700) BP: (116-155)/(52-90) 126/64 mmHg (12/23 0700) SpO2:  [99 %-100 %] 100 % (12/23 0700) Weight:  [91.7 kg (202 lb 2.6 oz)-91.9 kg (202 lb 9.6 oz)] 91.9 kg (202 lb 9.6 oz) (12/23 0500) Last BM Date: 12/05/12  Weight change: Filed Weights   12/12/12 0700 12/12/12 1140 12/13/12 0500  Weight: 94.6 kg (208 lb 8.9 oz) 91.7 kg (202 lb 2.6 oz) 91.9 kg (202 lb 9.6 oz)    Intake/Output:   Intake/Output Summary (Last 24 hours) at 12/13/12 0750 Last data filed at 12/12/12 2300  Gross per 24 hour  Intake    810 ml  Output   3000 ml  Net  -2190 ml     Physical Exam:  General:  sitting in chair HEENT: normal Neck: supple. JVP 10-11. Carotids 2+ bilat; no bruits. No lymphadenopathy or thryomegaly appreciated. LIJ catheter Cor: PMI  nondisplaced. Irregular rate & rhythm. No rubs, gallops or murmurs. Lungs: diminished. On 2 liters [place] Abdomen: soft, nontender, nondistended. No hepatosplenomegaly. No bruits or masses. Sluggish bowel sounds. Extremities: no cyanosis, clubbing, rash, Tr edema. RA changes Neuro: alert & orientedx3, cranial nerves grossly intact. moves all 4 extremities w/o difficulty. Affect pleasant. Intermittently confused  Telemetry: A fib 70-80s  Labs: Basic Metabolic Panel:  Lab 12/13/12 2130 12/12/12 0345 12/11/12 0441 12/10/12 1600 12/10/12 0400 12/09/12 0415 12/08/12 0420  NA 135 137 136 137 136 -- --  K 4.1 4.4 4.7 4.3 4.2 -- --  CL 97 101 100 100 100 -- --  CO2 29 25 27 28 28  -- --  GLUCOSE 80 75 107* 91 123* -- --  BUN 18 28* 18 12 16  -- --  CREATININE 1.85* 2.33* 1.65* 1.04 1.18* -- --  CALCIUM 9.1 9.5 9.2 -- -- -- --  MG -- 2.4 2.5 -- 2.4 2.4 2.2  PHOS 2.6 3.3 2.1* 1.8* 2.1* -- --    Liver Function Tests:  Lab 12/13/12 0400 12/12/12 0820 12/12/12 0345 12/11/12 0441 12/10/12 1600 12/10/12 0400  AST -- -- -- -- -- --  ALT -- 21 -- -- -- --  ALKPHOS -- -- -- -- -- --  BILITOT -- -- -- -- -- --  PROT -- -- -- -- -- --  ALBUMIN  3.0* -- 3.1* 3.2* 3.4* 3.5   No results found for this basename: LIPASE:5,AMYLASE:5 in the last 168 hours No results found for this basename: AMMONIA:3 in the last 168 hours  CBC:  Lab 12/13/12 0400 12/12/12 0345 12/11/12 0441 12/10/12 0400 12/09/12 0415  WBC 7.4 8.0 7.7 6.0 6.2  NEUTROABS -- -- -- -- --  HGB 8.5* 8.6* 8.7* 8.5* 8.1*  HCT 29.0* 30.2* 29.3* 29.1* 27.2*  MCV 91.8 92.1 91.3 90.7 88.9  PLT 96* 147* 164 156 148*    Cardiac Enzymes: No results found for this basename: CKTOTAL:5,CKMB:5,CKMBINDEX:5,TROPONINI:5 in the last 168 hours  BNP: BNP (last 3 results)  Basename 11/19/12 1655  PROBNP 7488.0*     Medications:     Scheduled Medications:    . antiseptic oral rinse  15 mL Mouth Rinse BID  . cefTRIAXone (ROCEPHIN)  IV  1 g  Intravenous Q24H  . darbepoetin (ARANESP) injection - DIALYSIS  150 mcg Intravenous Q Tue-HD  . diltiazem  120 mg Oral QHS  . doxercalciferol  2 mcg Intravenous Q T,Th,Sa-HD  . feeding supplement (NEPRO CARB STEADY)  237 mL Oral BID BM  . ferric gluconate (FERRLECIT/NULECIT) IV  125 mg Intravenous Q Tue-HD  . heparin subcutaneous  5,000 Units Subcutaneous Q8H  . multivitamin  1 tablet Oral QHS  . senna-docusate  1 tablet Oral Daily  . sodium chloride  10 mL Intravenous Q12H  . traMADol  50 mg Oral Q12H    Infusions:    . sodium chloride Stopped (12/12/12 0100)    PRN Medications: acetaminophen, ALPRAZolam, feeding supplement (NEPRO CARB STEADY)   Assessment:   1. Acute on chronic diastolic CHF  2. Permanent atrial fibrillation  3. Morbid obesity  4. Acute/Chronic renal failure, stage 4       --solitary kidney  5. OSA  6. Physical debility/deconditioning - wheelchair bound  7. Noncompliance  8. Hypertension, poorly controlled 9. UTI, cipro started 12/10 10. Acute respiratory failure 11. Anemia - 12. Acute delirium 13. Arthritis - Lt knee  Plan/Discussion:    She is now dialysis dependent. Tolerated HD well. Weight down to 202.  Will plan for dialysis again on Tuesday.  Awaiting perm cath and permanent access placed this week.   Then will plan d/c to SNF with arrangements for outpatient HD. Will d/w Case Manager today. Appreciate Renal's care.   Will resume coumadin for AF after all access placed.  Use tramadol for arthritis pain.  Length of Stay: 24    Desiree Caracci,MD 7:50 AM

## 2012-12-13 NOTE — Progress Notes (Signed)
VASCULAR LAB PRELIMINARY  PRELIMINARY  PRELIMINARY  PRELIMINARY  Right  Upper Extremity Vein Map    Cephalic  Segment Diameter Depth Comment  1. Axilla 3.69mm mm   2. Mid upper arm 3.75mm mm Branch  3. Above AC 3.3mm mm   4. In [facility] 5.67mm mm   5. Below AC 4.66mm mm   6. Mid forearm 4.93mm mm   7. Wrist 3.56mm mm Multiple branching distal to this measurement   mm mm    mm mm    mm mm    Basilic  Segment Diameter Depth Comment  1. Axilla mm mm   2. Mid upper arm 4.53mm 10.37mm   3. Above [facility] 1.42mm 5.67mm   4. In [facility] 2.32mm 3.58mm   5. Below AC 1.20mm mm   6. Mid forearm mm mm   7. Wrist mm mm    mm mm    mm mm    mm mm    Left Upper Extremity Vein Map    Cephalic  Segment Diameter Depth Comment  1. Axilla 3.12mm mm   2. Mid upper arm 3.67mm mm   3. Above AC 3.11mm mm   4. In [facility] 5.72mm mm   5. Below AC 4.88mm mm   6. Mid forearm 4.22mm mm   7. Wrist 3.32mm mm    mm mm    mm mm    mm mm    Basilic  Segment Diameter Depth Comment  1. Axilla mm mm   2. Mid upper arm 4.28mm 23.56mm   3. Above [facility] 3.60mm 13mm   4. In AC 2.43mm 8.50mm   5. Below AC 1.43mm mm   6. Mid forearm mm mm   7. Wrist mm mm    mm mm    mm mm    mm mm     Odester Nilson, RVT 12/13/2012, 9:36 AM

## 2012-12-14 LAB — RENAL FUNCTION PANEL
Albumin: 3 g/dL — ABNORMAL LOW (ref 3.5–5.2)
BUN: 32 mg/dL — ABNORMAL HIGH (ref 6–23)
Calcium: 9.3 mg/dL (ref 8.4–10.5)
Chloride: 102 mEq/L (ref 96–112)
Creatinine, Ser: 2.55 mg/dL — ABNORMAL HIGH (ref 0.50–1.10)

## 2012-12-14 LAB — CBC
HCT: 29.2 % — ABNORMAL LOW (ref 36.0–46.0)
MCH: 27 pg (ref 26.0–34.0)
MCV: 92.7 fL (ref 78.0–100.0)
Platelets: 98 10*3/uL — ABNORMAL LOW (ref 150–400)
RDW: 18.7 % — ABNORMAL HIGH (ref 11.5–15.5)
WBC: 8 10*3/uL (ref 4.0–10.5)

## 2012-12-14 MED ORDER — DOXERCALCIFEROL 4 MCG/2ML IV SOLN
INTRAVENOUS | Status: AC
  Administered 2012-12-14: 4 ug via INTRAVENOUS
  Filled 2012-12-14: qty 2

## 2012-12-14 MED ORDER — HYDRALAZINE HCL 20 MG/ML IJ SOLN
10.0000 mg | INTRAMUSCULAR | Status: DC | PRN
  Administered 2012-12-14: 10 mg via INTRAVENOUS
  Filled 2012-12-14: qty 0.5
  Filled 2012-12-14: qty 1

## 2012-12-14 MED ORDER — PRO-STAT SUGAR FREE PO LIQD
30.0000 mL | Freq: Two times a day (BID) | ORAL | Status: DC
  Administered 2012-12-14 – 2012-12-19 (×9): 30 mL via ORAL
  Filled 2012-12-14 (×13): qty 30

## 2012-12-14 NOTE — Progress Notes (Signed)
NUTRITION FOLLOW UP  Intervention:   1. Continue Nepro BID 2. Add 30 ml Prostat po BID, each supplement provides 100 kcal and 15 grams protein. 3. RD will continue to follow   Nutrition Dx:   Inadequate oral intake related to poor appetite as evidenced by RN report. Ongoing  Goal:   Meet >/=90% estimated nutrition needs. Unmet   Monitor:   PO intake, weight trends, labs, education  Assessment:   CRRT discontinued on 12/20. First HD treatment on 12/22. Per team, pt is now HD dependent. Pt planning to d/c to SNF. Will need permcath this week. Per [facility], plan for diatek catheter placement on Thursday.  Per RN note, pt is unable to hold utensils 2/2 hand contractures. Pt insists on feeding herself (unsuccessfully) and refuses to be fed, subsequently her intake is poor and consists only of things that she can grab between her wrists such as part of a bagel at breakfast. Currently eating 50% of meals. Currently ordered for Nepro Shakes PO BID. Appears to be taking at least half the time.  Height: Ht Readings from Last 1 Encounters:  12/07/12 5\' 2"  (1.575 m)    Weight Status:  Continues to trend down with HD Wt Readings from Last 1 Encounters:  12/14/12 193 lb 5.5 oz (87.7 kg)    Re-estimated needs:  Kcal: 2000-2200 Protein: 105-120 gm  Fluid: 1.2 L   Skin: intact   Diet Order: Renal 80-90   Intake/Output Summary (Last 24 hours) at 12/14/12 1124 Last data filed at 12/14/12 1036  Gross per 24 hour  Intake    420 ml  Output   3150 ml  Net  -2730 ml    Last BM: 12/23  Labs:   Lab 12/14/12 0500 12/13/12 0400 12/12/12 0345 12/11/12 0441 12/10/12 0400  NA 139 135 137 -- --  K 3.9 4.1 4.4 -- --  CL 102 97 101 -- --  CO2 28 29 25  -- --  BUN 32* 18 28* -- --  CREATININE 2.55* 1.85* 2.33* -- --  CALCIUM 9.3 9.1 9.5 -- --  MG -- -- 2.4 2.5 2.4  PHOS 3.3 2.6 3.3 -- --  GLUCOSE 87 80 75 -- --    CBG (last 3)   Basename 12/12/12 0826  GLUCAP 89    Scheduled Meds:     . antiseptic oral rinse  15 mL Mouth Rinse BID  . cefTRIAXone (ROCEPHIN)  IV  1 g Intravenous Q24H  . darbepoetin (ARANESP) injection - DIALYSIS  150 mcg Intravenous Q Tue-HD  . diltiazem  120 mg Oral QHS  . doxercalciferol  4 mcg Intravenous Q T,Th,Sa-HD  . feeding supplement (NEPRO CARB STEADY)  237 mL Oral BID BM  . ferric gluconate (FERRLECIT/NULECIT) IV  125 mg Intravenous Q Tue-HD  . heparin subcutaneous  5,000 Units Subcutaneous Q8H  . multivitamin  1 tablet Oral QHS  . senna-docusate  1 tablet Oral Daily  . sodium chloride  10 mL Intravenous Q12H  . traMADol  50 mg Oral Q12H    Continuous Infusions:    . sodium chloride 10 mL/hr at 12/13/12 [address] [place], [place], [place] Pager: [phone] After-hours pager: [phone]

## 2012-12-14 NOTE — Progress Notes (Signed)
Physical Therapy Treatment Patient Details Name: Rion Schnitzer MRN: [id number] DOB: 1931/04/23 Today's Date: 12/14/2012 Time: 1209-1239 PT Time Calculation (min): 30 min  PT Assessment / Plan / Recommendation Comments on Treatment Session  Limited by pain and fatigue. Unable to clear hips for standing so used maxi-sky for transfer. Need to premedicate next visit. Limited significantly by arthritis pain.     Follow Up Recommendations  SNF     Does the patient have the potential to tolerate intense rehabilitation     Barriers to Discharge        Equipment Recommendations  None recommended by PT    Recommendations for Other Services    Frequency     Plan Discharge plan remains appropriate;Frequency remains appropriate    Precautions / Restrictions Precautions Precautions: Fall Restrictions Weight Bearing Restrictions: No       Mobility  Bed Mobility Bed Mobility: Supine to Sit Supine to Sit: 1: +2 Total assist Supine to Sit: Patient Percentage: 40% Sitting - Scoot to Edge of Bed: 2: Max assist Details for Bed Mobility Assistance: pt initiationg movement of lower extremities but needing facilitation with pad to scoot hips around to EOB and max facilitation to bring trunk upright Transfers Transfers: Sit to Stand;Stand to [employer] via Lift Equipment: Maxisky Details for Transfer Assistance: sit->stand attempted x3 but pt unable to clear hips again today with complaints of pain and pt gaurding/resistant due to pain in feet, knees and hips (arthritis); maxi-sky utilized to get pt up to chair    Exercises General Exercises - Lower Extremity Ankle Circles/Pumps: AROM;Both;20 reps;Supine    PT Goals Acute Rehab PT Goals PT Goal: Supine/Side to Sit - Progress: Progressing toward goal PT Goal: Sit to Stand - Progress: Progressing toward goal PT Transfer Goal: Bed to Chair/Chair to Bed - Progress: Progressing toward goal  Visit Information  Last PT Received On:  12/14/12 Assistance Needed: +2    Subjective Data  Subjective: I need some drugs.    Cognition  Overall Cognitive Status: Appears within functional limits for tasks assessed/performed Arousal/Alertness: Awake/alert Orientation Level: Appears intact for tasks assessed Behavior During Session: [facility] for tasks performed    Balance  Balance Balance Assessed: Yes Static Sitting Balance Static Sitting - Balance Support: Bilateral upper extremity supported Static Sitting - Level of Assistance: 5: Stand by assistance;4: Min assist Static Sitting - Comment/# of Minutes: initially pt needing min-modA to maintain balance (posterior pelvic tilt with rounded back), cues for tall posture and with her upper extremities assisting pt only needing stand by assist  Dynamic Sitting Balance Dynamic Sitting - Level of Assistance: 3: Mod assist Dynamic Sitting Balance - Compensations: mod facilitation to shift weight and stabilize laterally and lean outside BOS  End of Session PT - End of Session Equipment Utilized During Treatment: Gait belt Activity Tolerance: Patient limited by fatigue;Patient limited by pain Patient left: in chair;with call bell/phone within reach Nurse Communication: Mobility status   GP     [facility] HELEN 12/14/2012, 12:51 PM

## 2012-12-14 NOTE — Progress Notes (Signed)
Advanced Heart Failure Rounding Note   Subjective:    Desiree Pierce is a 78 yo female with PMHx s/f chronic diastolic CHF ( Baseline weight 200-205 lbs.), congenital solitary kidney, CKD (stage IV, baseline Cr 1.5-2.4), COPD + OSA + morbid obesity ->secondary pulmonary HTN, permanent atrial fibrillation, OSA, GERD.Admitted with progressive SOB and worsening LE edema.. Admit weight 232 pounds.   She is quite limited by arthritis, and is essentially wheelchair bound.   11/22/12 ECHO EF 60% Moderate LVH Mild Mitral regurgitation. +RV dysfunction  12/01/12: RHC RA = 21  RV = 80/11/19  PA = 88/34 (56)  PCW = 24 v = 35  Fick cardiac output/index = 4.9/2.5  PVR = 7.3 woods  FA sat = 96%  PA sat = 55%, 57%  Started on lasix gtt, sildenafil and milrinone post-cath.  Since that time developed progressive renal failure.Developed worsening renal failure. CVVHD 12/05/12-12/20.  Started on HD on 12/22, tolerated well.  In HD. Tolerating well. Doesn't appear to be making urine. No CP/SOB.    Objective:    Vital Signs:   Temp:  [97.4 F (36.3 C)-98.3 F (36.8 C)] 97.9 F (36.6 C) (12/24 0727) Pulse Rate:  [83-97] 93  (12/24 0733) Resp:  [12-23] 13  (12/24 0733) BP: (121-159)/(54-98) 152/83 mmHg (12/24 0733) SpO2:  [95 %-100 %] 100 % (12/24 0733) Weight:  [91.6 kg (201 lb 15.1 oz)] 91.6 kg (201 lb 15.1 oz) (12/24 0727) Last BM Date: 12/13/12  Weight change: Filed Weights   12/13/12 0500 12/14/12 0444 12/14/12 0727  Weight: 91.9 kg (202 lb 9.6 oz) 91.6 kg (201 lb 15.1 oz) 91.6 kg (201 lb 15.1 oz)    Intake/Output:   Intake/Output Summary (Last 24 hours) at 12/14/12 1610 Last data filed at 12/13/12 2200  Gross per 24 hour  Intake    590 ml  Output      0 ml  Net    590 ml     Physical Exam:  General:  sitting in chair HEENT: normal Neck: supple. JVP 10-11. Carotids 2+ bilat; no bruits. No lymphadenopathy or thryomegaly appreciated. LIJ catheter Cor: PMI nondisplaced. Irregular  rate & rhythm. No rubs, gallops or murmurs. Lungs: diminished. On 2 liters Elma Abdomen: soft, nontender, nondistended. No hepatosplenomegaly. No bruits or masses. Sluggish bowel sounds. Extremities: no cyanosis, clubbing, rash, Tr edema. RA changes Neuro: alert & orientedx3, cranial nerves grossly intact. moves all 4 extremities w/o difficulty. Affect pleasant. Intermittently confused  Telemetry: A fib 70-80s  Labs: Basic Metabolic Panel:  Lab 12/14/12 9604 12/13/12 0400 12/12/12 0345 12/11/12 0441 12/10/12 1600 12/10/12 0400 12/09/12 0415 12/08/12 0420  NA 139 135 137 136 137 -- -- --  K 3.9 4.1 4.4 4.7 4.3 -- -- --  CL 102 97 101 100 100 -- -- --  CO2 28 29 25 27 28  -- -- --  GLUCOSE 87 80 75 107* 91 -- -- --  BUN 32* 18 28* 18 12 -- -- --  CREATININE 2.55* 1.85* 2.33* 1.65* 1.04 -- -- --  CALCIUM 9.3 9.1 9.5 -- -- -- -- --  MG -- -- 2.4 2.5 -- 2.4 2.4 2.2  PHOS 3.3 2.6 3.3 2.1* 1.8* -- -- --    Liver Function Tests:  Lab 12/14/12 0500 12/13/12 0400 12/12/12 0820 12/12/12 0345 12/11/12 0441 12/10/12 1600  AST -- -- -- -- -- --  ALT -- -- 21 -- -- --  ALKPHOS -- -- -- -- -- --  BILITOT -- -- -- -- -- --  PROT -- -- -- -- -- --  ALBUMIN 3.0* 3.0* -- 3.1* 3.2* 3.4*   No results found for this basename: LIPASE:5,AMYLASE:5 in the last 168 hours No results found for this basename: AMMONIA:3 in the last 168 hours  CBC:  Lab 12/14/12 0435 12/13/12 1322 12/13/12 0400 12/12/12 0345 12/11/12 0441  WBC 8.0 7.5 7.4 8.0 7.7  NEUTROABS -- -- -- -- --  HGB 8.5* 8.7* 8.5* 8.6* 8.7*  HCT 29.2* 29.9* 29.0* 30.2* 29.3*  MCV 92.7 92.6 91.8 92.1 91.3  PLT 98* 94* 96* 147* 164    Cardiac Enzymes: No results found for this basename: CKTOTAL:5,CKMB:5,CKMBINDEX:5,TROPONINI:5 in the last 168 hours  BNP: BNP (last 3 results)  Basename 11/19/12 1655  PROBNP 7488.0*     Medications:     Scheduled Medications:    . antiseptic oral rinse  15 mL Mouth Rinse BID  . cefTRIAXone  (ROCEPHIN)  IV  1 g Intravenous Q24H  . darbepoetin (ARANESP) injection - DIALYSIS  150 mcg Intravenous Q Tue-HD  . diltiazem  120 mg Oral QHS  . doxercalciferol  4 mcg Intravenous Q T,Th,Sa-HD  . feeding supplement (NEPRO CARB STEADY)  237 mL Oral BID BM  . ferric gluconate (FERRLECIT/NULECIT) IV  125 mg Intravenous Q Tue-HD  . heparin  40 Units/kg Dialysis Once in dialysis  . heparin subcutaneous  5,000 Units Subcutaneous Q8H  . multivitamin  1 tablet Oral QHS  . senna-docusate  1 tablet Oral Daily  . sodium chloride  10 mL Intravenous Q12H  . traMADol  50 mg Oral Q12H    Infusions:    . sodium chloride 10 mL/hr at 12/13/12 1033    PRN Medications: sodium chloride, sodium chloride, acetaminophen, ALPRAZolam, feeding supplement (NEPRO CARB STEADY), feeding supplement (NEPRO CARB STEADY), heparin, heparin, heparin, lidocaine, lidocaine-prilocaine, pentafluoroprop-tetrafluoroeth   Assessment:   1. Acute on chronic diastolic CHF  2. Permanent atrial fibrillation  3. Morbid obesity  4. Acute/Chronic renal failure, stage 4       --solitary kidney  5. OSA  6. Physical debility/deconditioning - wheelchair bound  7. Noncompliance  8. Hypertension, poorly controlled 9. UTI, cipro started 12/10 10. Acute respiratory failure 11. Anemia - 12. Acute delirium 13. Arthritis - Lt knee  Plan/Discussion:    She is now dialysis dependent. Tolerated HD well. Weight down to 201.  Awaiting perm cath and permanent access placed. Hopefully can be done soon. (Can we consider transfer to [facility] while awaiting access placement?)  Then will plan d/c to SNF with arrangements for outpatient HD. Will d/w Case Manager today. Appreciate Renal's care.   Will resume coumadin for AF after all access placed.   Length of Stay: 25    Shalina Norfolk,MD 8:12 AM

## 2012-12-14 NOTE — Consult Note (Signed)
[facility] [place] CONSULT NOTE 12/14/2012 DOB: [id number] MRN : [id number]  CC:CKD Referring Physician:James L Deterding, MD   History of Present Illness:Desiree Pierce is a 78 y.o. female with PMHx s/f chronic diastolic CHF, congenital solitary kidney, CKD (stage IV, baseline Cr about 2mg /dl), COPD + OSA + morbid obesity, secondary pulmonary HTN, permanent atrial fibrillation, OSA, GERD who presents to [facility] ED on 11/19/12 with shortness of breath. Baseline weight in June 195 lbs and weight at admission at 232 lbs. She was treated with IV diuretics for acute on chronic CHF. Has had worsening of renal function with elevation CVP. She underwent cardiac cath revealing severe pulmonary hypertension, and PCWP of 24 and was started on trial of milrinone and sildenafil with poor UOP and low BP. We are asked to see now due to rising serum creatinine to 4.38. Diuretics have been held. I s and O s reveal -[id number] ccs since admission. Current UOP has diminished to 250 cc last 24 hrs and 75 cc recorded today.  We have been consulted for diatek catheter placement and evaluation for access via fistula or graft.       Past Medical History  Diagnosis Date  . Morbid obesity   . Atrial fibrillation   . Arthritis   . Allergy history unknown   . Hypertension     Poorly controlled  . CHF (congestive heart failure)   . Pulmonary hypertension   . GERD (gastroesophageal reflux disease)   . COPD (chronic obstructive pulmonary disease)   . OA (osteoarthritis)   . Solitary kidney, congenital     Past Surgical History  Procedure Date  . Total abdominal hysterectomy   . Total knee arthroplasty     Right knee  . Ankle fracture surgery     Right ankle repair     ROS: [x]  Positive  [ ]  Denies    General: [ ]  Weight loss, [ ]  Fever, [ ]  chills Neurologic: [ ]  Dizziness, [ ]  Blackouts, [ ]  Seizure [ ]  Stroke, [ ]  "Mini stroke", [ ]  Slurred speech, [ ]  Temporary blindness; [x]  weakness in  arms or legs, [ ]  Hoarseness Cardiac: [ ]  Chest pain/pressure, [ ]  Shortness of breath at rest [x ] Shortness of breath with exertion, [ ]  Atrial fibrillation or irregular heartbeat Vascular: [ ]  Pain in legs with walking, [ ]  Pain in legs at rest, [ ]  Pain in legs at night,  [ ]  Non-healing ulcer, [ ]  Blood clot in vein/DVT,   Pulmonary: [ ]  Home oxygen, [ ]  Productive cough, [ ]  Coughing up blood, [ ]  Asthma,  [ ]  Wheezing Musculoskeletal:  [x ] Arthritis, [x ] Low back pain, [x ] Joint pain Hematologic: [ ]  Easy Bruising, [ ]  Anemia; [ ]  Hepatitis Gastrointestinal: [ ]  Blood in stool, [ ]  Gastroesophageal Reflux/heartburn, [ ]  Trouble swallowing Urinary: [x ] chronic Kidney disease, [x ] on HD - [ ]  MWF or [x ] TTHS, [ ]  Burning with urination, [ ]  Difficulty urinating congenital solitary kidney  Skin: [ ]  Rashes, [ ]  Wounds Psychological: [x ] Anxiety, [ ]  Depression  Social History History  Substance Use Topics  . Smoking status: Former Smoker -- 0.1 packs/day for 5 years    Quit date: 12/22/1968  . Smokeless tobacco: Not on file  . Alcohol Use: No    Family History Family History  Problem Relation Age of Onset  . Allergies Mother   . Heart disease Mother   .  Asthma Father   . Diabetes type II Maternal Grandmother     No Known Allergies  Current Facility-Administered Medications  Medication Dose Route Frequency Provider Last Rate Last Dose  . 0.45 % sodium chloride infusion   Intravenous Continuous Trevor Iha, MD 10 mL/hr at 12/13/12 1033    . 0.9 %  sodium chloride infusion  100 mL Intravenous PRN Llana Aliment Deterding, MD      . 0.9 %  sodium chloride infusion  100 mL Intravenous PRN Trevor Iha, MD      . acetaminophen (TYLENOL) tablet 650 mg  650 mg Oral Q4H PRN Gery Pray, PA-C   650 mg at 12/10/12 1939  . ALPRAZolam Prudy Feeler) tablet 0.25 mg  0.25 mg Oral BID PRN Gery Pray, PA-C   0.25 mg at 12/10/12 2157  . antiseptic oral rinse (BIOTENE)  solution 15 mL  15 mL Mouth Rinse BID Dolores Patty, MD   15 mL at 12/13/12 0800  . cefTRIAXone (ROCEPHIN) 1 g in dextrose 5 % 50 mL IVPB  1 g Intravenous Q24H Dolores Patty, MD   1 g at 12/13/12 1023  . darbepoetin (ARANESP) injection 150 mcg  150 mcg Intravenous Q Tue-HD Trevor Iha, MD      . diltiazem (CARDIZEM CD) 24 hr capsule 120 mg  120 mg Oral QHS Trevor Iha, MD   120 mg at 12/13/12 2105  . doxercalciferol (HECTOROL) injection 4 mcg  4 mcg Intravenous Q T,Th,Sa-HD Trevor Iha, MD   4 mcg at 12/14/12 0901  . feeding supplement (NEPRO CARB STEADY) liquid 237 mL  237 mL Oral PRN Tonye Becket, RD   150 mL at 12/11/12 0400  . feeding supplement (NEPRO CARB STEADY) liquid 237 mL  237 mL Oral BID BM Sadie Haber, MD   237 mL at 12/13/12 1318  . feeding supplement (NEPRO CARB STEADY) liquid 237 mL  237 mL Oral PRN Trevor Iha, MD      . ferric gluconate (NULECIT) 125 mg in sodium chloride 0.9 % 100 mL IVPB  125 mg Intravenous Q Tue-HD Sadie Haber, MD      . heparin injection 1,000 Units  1,000 Units Dialysis PRN Trevor Iha, MD      . heparin injection 1,000 Units  1,000 Units Dialysis PRN Trevor Iha, MD      . heparin injection 3,700 Units  40 Units/kg Dialysis Once in dialysis Trevor Iha, MD      . heparin injection 5,000 Units  5,000 Units Subcutaneous Q8H Dolores Patty, MD   5,000 Units at 12/14/12 0554  . heparin injection 9,200 Units  100 Units/kg Dialysis PRN Llana Aliment Deterding, MD      . lidocaine (XYLOCAINE) 1 % injection 5 mL  5 mL Intradermal PRN Trevor Iha, MD      . lidocaine-prilocaine (EMLA) cream 1 application  1 application Topical PRN Trevor Iha, MD      . multivitamin (RENA-VIT) tablet 1 tablet  1 tablet Oral QHS Trevor Iha, MD   1 tablet at 12/13/12 2108  . pentafluoroprop-tetrafluoroeth (GEBAUERS) aerosol 1 application  1 application Topical PRN Trevor Iha, MD      .  senna-docusate (Senokot-S) tablet 1 tablet  1 tablet Oral Daily Gery Pray, PA-C   1 tablet at 12/13/12 1024  . sodium chloride 0.9 % injection 10 mL  10 mL Intravenous Q12H Bevelyn Buckles  Bensimhon, MD   10 mL at 12/13/12 2106  . traMADol (ULTRAM) tablet 50 mg  50 mg Oral Q12H Hadassah Pais, PA   50 mg at 12/13/12 2105     Imaging: No results found.  Significant Diagnostic Studies: CBC Lab Results  Component Value Date   WBC 8.0 12/14/2012   HGB 8.5* 12/14/2012   HCT 29.2* 12/14/2012   MCV 92.7 12/14/2012   PLT 98* 12/14/2012    BMET    Component Value Date/Time   NA 139 12/14/2012 0500   K 3.9 12/14/2012 0500   CL 102 12/14/2012 0500   CO2 28 12/14/2012 0500   GLUCOSE 87 12/14/2012 0500   BUN 32* 12/14/2012 0500   CREATININE 2.55* 12/14/2012 0500   CALCIUM 9.3 12/14/2012 0500   GFRNONAA 17* 12/14/2012 0500   GFRAA 19* 12/14/2012 0500    COAG Lab Results  Component Value Date   INR 1.33 12/14/2012   INR 1.50* 12/13/2012   INR 1.61* 12/12/2012   No results found for this basename: PTT     Physical Examination BP Readings from Last 3 Encounters:  12/14/12 137/81  12/14/12 137/81  06/17/12 140/82   Temp Readings from Last 3 Encounters:  12/14/12 97.9 F (36.6 C) Oral  12/14/12 97.9 F (36.6 C) Oral   SpO2 Readings from Last 3 Encounters:  12/14/12 100%  12/14/12 100%  06/17/12 95%   Pulse Readings from Last 3 Encounters:  12/14/12 92  12/14/12 92  06/17/12 76    General:  WDWN in NAD Gait: non ambulatory HENT: WNL Eyes: Pupils equal Pulmonary: normal non-labored breathing , without Rales, rhonchi,  wheezing Cardiac: permanent atrial fibrillation chronic diastolic CHF ( Baseline weight 200-205 lbs.),   No carotid bruits Abdomen: soft, NT, no masses Skin: no rashes, ulcers noted Vascular Exam/Pulses:Palpable radial pulses bilaterally  Extremities without ischemic changes, no Gangrene , no cellulitis; no open wounds;  Musculoskeletal: no  muscle wasting or atrophy  Neurologic: A&O X 3; Appropriate Affect ;  SENSATION: normal; MOTOR FUNCTION: Pt has poor and not equal strength in all extremities - 4/5 secondary to multiple joint arthritis Speech is fluent/normal  Non-Invasive Vascular Imaging: Vein mapping completed  ASSESSMENT/PLAN: We will plan diatek catheter placement on Thursday 12-16-2012 Access will be planned as well once Dr. Myra Gianotti has reviewed the vein mapping. We will proceed with Left radio cephalic fistula creation. NPO 12-15-2012 at [facility]

## 2012-12-14 NOTE — Progress Notes (Signed)
PT Cancellation Note  Patient Details Name: Eleyna Brugh MRN: [id number] DOB: 1931-10-20   Cancelled Treatment:    Reason Eval/Treat Not Completed: Patient at procedure or test/unavailable. Pt at HD. Will f/u as time allows.   [facility] HELEN 12/14/2012, 8:09 AM Pager: [phone]

## 2012-12-14 NOTE — Clinical Social Work Note (Signed)
Clinical Social Worker followed up with [facility], that is of preference for patient, and they are willing to accept patient as long as her HD is set to their facility on a M, W, F schedule. CSW will continue to follow.   Rozetta Nunnery MSW, [employer] [phone]

## 2012-12-14 NOTE — Progress Notes (Signed)
Pt. Refuses BIPAP at this time. Pt. Stated that she "didn't wear a mask over her face at night." Pt. Is resting well at this time on a 2L .

## 2012-12-14 NOTE — Progress Notes (Signed)
Subjective: Interval History: none.  Objective: Vital signs in last 24 hours: Temp:  [97.4 F (36.3 C)-98.3 F (36.8 C)] 97.9 F (36.6 C) (12/24 0727) Pulse Rate:  [83-97] 95  (12/24 0900) Resp:  [11-23] 11  (12/24 0900) BP: (125-159)/(54-98) 129/79 mmHg (12/24 0900) SpO2:  [95 %-100 %] 100 % (12/24 0900) Weight:  [91.6 kg (201 lb 15.1 oz)] 91.6 kg (201 lb 15.1 oz) (12/24 0727) Weight change: -0.1 kg (-3.5 oz)  Intake/Output from previous day: 12/23 0701 - 12/24 0700 In: 590 [P.O.:520; IV Piggyback:50] Out: -  Intake/Output this shift:    General appearance: alert, cooperative and appears stated age Resp: diminished breath sounds bilaterally and rales bibasilar Cardio: irregularly irregular rhythm GI: obese, pos bs,liver down 5 cm Extremities: edema 2+  Lab Results:  Basename 12/14/12 0435 12/13/12 1322  WBC 8.0 7.5  HGB 8.5* 8.7*  HCT 29.2* 29.9*  PLT 98* 94*   BMET:  Basename 12/14/12 0500 12/13/12 0400  NA 139 135  K 3.9 4.1  CL 102 97  CO2 28 29  GLUCOSE 87 80  BUN 32* 18  CREATININE 2.55* 1.85*  CALCIUM 9.3 9.1   No results found for this basename: PTH:2 in the last 72 hours Iron Studies: No results found for this basename: IRON,TIBC,TRANSFERRIN,FERRITIN in the last 72 hours  Studies/Results: No results found.  I have reviewed the patient's current medications.  Assessment/Plan: 1 CKD very little urine despite Cr.  Cannot survive without HD.  HD today, cont to lower vol 2 Anemia epo/fe 3 HPTH Vit D 4 CM stable 5 Afib stabel 6Pulm HTN vol better 7 COPD P HD, access, epo/fe, Vit D    LOS: 25 days   Desiree Pierce 12/14/2012,9:18 AM

## 2012-12-14 NOTE — Procedures (Signed)
I was present at this session.  I have reviewed the session itself and made appropriate changes.  Temp cath flow 400. Bp tol vol off  Jeana Kersting L 12/24/20139:18 AM

## 2012-12-15 DIAGNOSIS — N186 End stage renal disease: Secondary | ICD-10-CM

## 2012-12-15 LAB — PROTIME-INR: INR: 1.29 (ref 0.00–1.49)

## 2012-12-15 LAB — CBC
HCT: 29.3 % — ABNORMAL LOW (ref 36.0–46.0)
Hemoglobin: 8.5 g/dL — ABNORMAL LOW (ref 12.0–15.0)
MCHC: 29 g/dL — ABNORMAL LOW (ref 30.0–36.0)
WBC: 10.3 10*3/uL (ref 4.0–10.5)

## 2012-12-15 LAB — RENAL FUNCTION PANEL
BUN: 27 mg/dL — ABNORMAL HIGH (ref 6–23)
CO2: 31 mEq/L (ref 19–32)
Chloride: 101 mEq/L (ref 96–112)
Glucose, Bld: 94 mg/dL (ref 70–99)
Potassium: 3.8 mEq/L (ref 3.5–5.1)

## 2012-12-15 LAB — APTT: aPTT: 91 seconds — ABNORMAL HIGH (ref 24–37)

## 2012-12-15 MED ORDER — ARGATROBAN 50 MG/50ML IV SOLN
0.4000 ug/kg/min | INTRAVENOUS | Status: AC
  Administered 2012-12-15: 0.5 ug/kg/min via INTRAVENOUS
  Filled 2012-12-15: qty 50

## 2012-12-15 NOTE — Progress Notes (Signed)
Subjective: Interval History: none.  Objective: Vital signs in last 24 hours: Temp:  [97.8 F (36.6 C)-100.6 F (38.1 C)] 100.6 F (38.1 C) (12/25 0400) Pulse Rate:  [91-110] 106  (12/25 0700) Resp:  [13-21] 19  (12/25 0700) BP: (107-198)/(47-101) 131/68 mmHg (12/25 0700) SpO2:  [91 %-100 %] 99 % (12/25 0700) Weight:  [87.7 kg (193 lb 5.5 oz)-90.1 kg (198 lb 10.2 oz)] 90.1 kg (198 lb 10.2 oz) (12/25 0500) Weight change: 0 kg (0 lb)  Intake/Output from previous day: 12/24 0701 - 12/25 0700 In: 630 [P.O.:580; IV Piggyback:50] Out: 3150  Intake/Output this shift:    General appearance: cooperative, moderately obese and slowed mentation Resp: diminished breath sounds bilaterally and rales bibasilar Cardio: irregularly irregular rhythm and systolic murmur: holosystolic 2/6, blowing at apex GI: liver down 5 cm,pos bs, obese Extremities: edema 2+  Lab Results:  Basename 12/15/12 0500 12/14/12 0435  WBC 10.3 8.0  HGB 8.5* 8.5*  HCT 29.3* 29.2*  PLT 69* 98*   BMET:  Basename 12/15/12 0500 12/14/12 0500  NA 140 139  K 3.8 3.9  CL 101 102  CO2 31 28  GLUCOSE 94 87  BUN 27* 32*  CREATININE 2.12* 2.55*  CALCIUM 9.0 9.3   No results found for this basename: PTH:2 in the last 72 hours Iron Studies: No results found for this basename: IRON,TIBC,TRANSFERRIN,FERRITIN in the last 72 hours  Studies/Results: No results found.  I have reviewed the patient's current medications.  Assessment/Plan: 1 CKDvol xs, improving. Cont to lower.  To get access and pc 2 Anemia epo fe stable 3 CM stable 4 Copd stable 5 Pulm HTN 6 Afib rate control and anticoag 7 low ptlt follow P HD, epo, access.     LOS: 26 days   Malone Admire L 12/15/2012,9:14 AM

## 2012-12-15 NOTE — Progress Notes (Signed)
Pt. Refuses to wear BIPAP at this time.  

## 2012-12-15 NOTE — Consult Note (Addendum)
I have seen and examined the above patient. I agree with the above noted. I have been asked to place a permanent dialysis catheter as well as to evaluate her for fistula access. I have reviewed her vein mapping. She has excellent bilateral cephalic veins. Since the patient is right-handed I have recommended beginning with a left radiocephalic fistula. I discussed the details of the procedure with the patient's daughter and son-in-law. They're well aware of dialysis as the son-in-law is currently on dialysis. They understand the need for future interventions to get the fistula to mature. We discussed the risk of steal syndrome as well. I will plan on doing her operation Thursday morning. 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 Her platelet count has dropped down to 60,000 today. She is being started on our argatroban currently. She is on Coumadin for atrial fibrillation. She has not had any doses for quite some time and her INR is near normal. I have discussed this with Dr. Gala Romney. I will proceed with her procedure tomorrow morning unless her platelet count drops again. If that were to happen her procedure would be canceled. The family also understands this. I will also have to stop her argatroban at midnight.  Durene Cal

## 2012-12-15 NOTE — Progress Notes (Addendum)
ANTICOAGULATION CONSULT NOTE - Initial Consult  Pharmacy Consult for argatroban Indication: possible HIT  No Known Allergies  Patient Measurements: Height: 5\' 2"  (157.5 cm) Weight: 198 lb 10.2 oz (90.1 kg) IBW/kg (Calculated) : 50.1    Vital Signs: Temp: 100.6 F (38.1 C) (12/25 0400) Temp src: Oral (12/25 0400) BP: 142/76 mmHg (12/25 0800) Pulse Rate: 103  (12/25 0800)  Labs:  Basename 12/15/12 0500 12/14/12 0500 12/14/12 0435 12/13/12 1322 12/13/12 0400  HGB 8.5* -- 8.5* -- --  HCT 29.3* -- 29.2* 29.9* --  PLT 69* -- 98* 94* --  APTT -- -- -- -- --  LABPROT 15.8* -- 16.2* -- 17.7*  INR 1.29 -- 1.33 -- 1.50*  HEPARINUNFRC -- -- -- -- --  CREATININE 2.12* 2.55* -- -- 1.85*  CKTOTAL -- -- -- -- --  CKMB -- -- -- -- --  TROPONINI -- -- -- -- --    Estimated Creatinine Clearance: 22.5 ml/min (by C-G formula based on Cr of 2.12).   Medical History: Past Medical History  Diagnosis Date  . Morbid obesity   . Atrial fibrillation   . Arthritis   . Allergy history unknown   . Hypertension     Poorly controlled  . CHF (congestive heart failure)   . Pulmonary hypertension   . GERD (gastroesophageal reflux disease)   . COPD (chronic obstructive pulmonary disease)   . OA (osteoarthritis)   . Solitary kidney, congenital     Medications:  Scheduled:    . antiseptic oral rinse  15 mL Mouth Rinse BID  . cefTRIAXone (ROCEPHIN)  IV  1 g Intravenous Q24H  . darbepoetin (ARANESP) injection - DIALYSIS  150 mcg Intravenous Q Tue-HD  . diltiazem  120 mg Oral QHS  . doxercalciferol  4 mcg Intravenous Q T,Th,Sa-HD  . feeding supplement (NEPRO CARB STEADY)  237 mL Oral BID BM  . feeding supplement  30 mL Oral BID  . ferric gluconate (FERRLECIT/NULECIT) IV  125 mg Intravenous Q Tue-HD  . multivitamin  1 tablet Oral QHS  . senna-docusate  1 tablet Oral Daily  . sodium chloride  10 mL Intravenous Q12H  . traMADol  50 mg Oral Q12H  . [DISCONTINUED] heparin  40 Units/kg  Dialysis Once in dialysis  . [DISCONTINUED] heparin subcutaneous  5,000 Units Subcutaneous Q8H    Assessment: 78 year old female who has been on heparin with CVVH from 12/16-12/20 and sq heparin since 12/20 and platelets are showing a steady decline with fall of more than 50% (12/25 plt= 69K compared to 204K when heparin started with CVVH).   There is a possibility or HIT and this was discussed with Dr. Gala Romney.  Plans are to start argatroban.  Goal of Therapy:  aPTT 50-90 seconds Monitor platelets by anticoagulation protocol: Yes   Plan:  -Begin argatroban at 0.5mg /kg/min -aPTT in 2 hours and daily -HIT panel -Will need hold coumadin until HIT panel verified  -Will follow patient progress  Harland German, Pharm D 12/15/2012 11:15 AM

## 2012-12-15 NOTE — Progress Notes (Signed)
ANTICOAGULATION CONSULT NOTE - Follow Up Consult  Pharmacy Consult for Argatroban Indication: Possible HIT  No Known Allergies  Patient Measurements: Height: 5\' 2"  (157.5 cm) Weight: 203 lb 4.2 oz (92.2 kg) IBW/kg (Calculated) : 50.1   Vital Signs: Temp: 98.1 F (36.7 C) (12/25 2051) Temp src: Oral (12/25 2051) BP: 163/85 mmHg (12/25 2051) Pulse Rate: 105  (12/25 2051)  Labs:  Basename 12/15/12 2141 12/15/12 1743 12/15/12 1451 12/15/12 0500 12/14/12 0500 12/14/12 0435 12/13/12 1322 12/13/12 0400  HGB -- -- -- 8.5* -- 8.5* -- --  HCT -- -- -- 29.3* -- 29.2* 29.9* --  PLT -- -- -- 69* -- 98* 94* --  APTT 91* 86* 45* -- -- -- -- --  LABPROT -- -- -- 15.8* -- 16.2* -- 17.7*  INR -- -- -- 1.29 -- 1.33 -- 1.50*  HEPARINUNFRC -- -- -- -- -- -- -- --  CREATININE -- -- -- 2.12* 2.55* -- -- 1.85*  CKTOTAL -- -- -- -- -- -- -- --  CKMB -- -- -- -- -- -- -- --  TROPONINI -- -- -- -- -- -- -- --    Estimated Creatinine Clearance: 22.7 ml/min (by C-G formula based on Cr of 2.12).   Medications:  Scheduled:    . antiseptic oral rinse  15 mL Mouth Rinse BID  . cefTRIAXone (ROCEPHIN)  IV  1 g Intravenous Q24H  . darbepoetin (ARANESP) injection - DIALYSIS  150 mcg Intravenous Q Tue-HD  . diltiazem  120 mg Oral QHS  . doxercalciferol  4 mcg Intravenous Q T,Th,Sa-HD  . feeding supplement (NEPRO CARB STEADY)  237 mL Oral BID BM  . feeding supplement  30 mL Oral BID  . ferric gluconate (FERRLECIT/NULECIT) IV  125 mg Intravenous Q Tue-HD  . multivitamin  1 tablet Oral QHS  . senna-docusate  1 tablet Oral Daily  . sodium chloride  10 mL Intravenous Q12H  . traMADol  50 mg Oral Q12H  . [DISCONTINUED] heparin subcutaneous  5,000 Units Subcutaneous Q8H   Assessment: 78 year old female who has been on heparin with [facility] from 12/16-12/20 and [place] heparin since 12/20 and platelets are showing a steady decline with fall of more than 50% (12/25 plt= 69K compared to 204K when heparin started with  [facility]). 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 Gala Romney. Argatroban per pharmacy. APTT at 2141 is 91, will reduce dose and check 2 hour aPTT.   Goal of Therapy:  aPTT 50-90 seconds Monitor platelets by anticoagulation protocol: Yes   Plan:  - Decrease argatroban to 0.4 mcg/kg/min - Argatroban to stop at 2359 tonight for procedure tomorrow  - Daily aPTT - Hold coumadin until HIT panel verified - f/u re-start after procedure  Abran Duke, PharmD Clinical Pharmacist Phone: [phone] Pager: [phone] 12/15/2012 10:27 PM

## 2012-12-15 NOTE — Progress Notes (Signed)
12/15/2012 6:50 PM  Pt arrived to 6709.  Pt is fully alert and oriented, high fall risk, lift necessary for OOB. Pt placed on telemetry, running afib.  Pt vitals stable, pt on RA.  Pt placed on bed alarm, high fall risk arm band and red socks in place.  Pt educated on fall prevention protocol and necessary interventions, and verbalized understanding.  Pt also oriented to room/unit, and was instructed on how to utilize the call bell, to which she verbalized understanding.  Will continue to monitor patient. Eunice Blase

## 2012-12-15 NOTE — Progress Notes (Addendum)
Advanced Heart Failure Rounding Note   Subjective:    Desiree Pierce is a 78 yo female with PMHx s/f chronic diastolic CHF, congenital solitary kidney, CKD (stage IV, baseline Cr 1.5-2.4), COPD + OSA + morbid obesity ->secondary pulmonary HTN, permanent atrial fibrillation, OSA, GERD.Admitted with recurrent diastolic HF and RHF. Admit weight 232 pounds.   Since admission has had worsening renal function and not HD dependent. Doing well with HD. Weight down. No sob, orthopnea. BP high last night and got a dose of hydralazine.  No change from yesterday. Doing well with HD. Heparin switched to argatroban due to possible HIT. HIT panel pending. Plt 69k->85k. For access placement today.    Objective:    Vital Signs:   Temp:  [97.8 F (36.6 C)-100.6 F (38.1 C)] 100.6 F (38.1 C) (12/25 0400) Pulse Rate:  [91-110] 103  (12/25 0800) Resp:  [13-20] 14  (12/25 0800) BP: (107-198)/(47-101) 142/76 mmHg (12/25 0800) SpO2:  [91 %-100 %] 100 % (12/25 0800) Weight:  [87.7 kg (193 lb 5.5 oz)-90.1 kg (198 lb 10.2 oz)] 90.1 kg (198 lb 10.2 oz) (12/25 0500) Last BM Date: 12/13/12  Weight change: Filed Weights   12/14/12 0727 12/14/12 1036 12/15/12 0500  Weight: 91.6 kg (201 lb 15.1 oz) 87.7 kg (193 lb 5.5 oz) 90.1 kg (198 lb 10.2 oz)    Intake/Output:   Intake/Output Summary (Last 24 hours) at 12/15/12 1015 Last data filed at 12/14/12 2200  Gross per 24 hour  Intake    630 ml  Output   3150 ml  Net  -2520 ml     Physical Exam:  General:  sitting in chair HEENT: normal Neck: supple. JVP  jaw. Carotids 2+ bilat; no bruits. No lymphadenopathy or thryomegaly appreciated. LIJ catheter Cor: PMI nondisplaced. Irregular rate & rhythm. No rubs, gallops or murmurs. Lungs: diminished. On 2 liters [place] Abdomen: soft, nontender, nondistended. No hepatosplenomegaly. No bruits or masses. Sluggish bowel sounds. Extremities: no cyanosis, clubbing, rash, no edema. RA changes Neuro: alert & orientedx3,  cranial nerves grossly intact. moves all 4 extremities w/o difficulty. Affect pleasant. Intermittently confused  Telemetry: A fib 70-80s  Labs: Basic Metabolic Panel:  Lab 12/15/12 8295 12/14/12 0500 12/13/12 0400 12/12/12 0345 12/11/12 0441 12/10/12 0400 12/09/12 0415  NA 140 139 135 137 136 -- --  K 3.8 3.9 4.1 4.4 4.7 -- --  CL 101 102 97 101 100 -- --  CO2 31 28 29 25 27  -- --  GLUCOSE 94 87 80 75 107* -- --  BUN 27* 32* 18 28* 18 -- --  CREATININE 2.12* 2.55* 1.85* 2.33* 1.65* -- --  CALCIUM 9.0 9.3 9.1 -- -- -- --  MG -- -- -- 2.4 2.5 2.4 2.4  PHOS 3.0 3.3 2.6 3.3 2.1* -- --    Liver Function Tests:  Lab 12/15/12 0500 12/14/12 0500 12/13/12 0400 12/12/12 0820 12/12/12 0345 12/11/12 0441  AST -- -- -- -- -- --  ALT -- -- -- 21 -- --  ALKPHOS -- -- -- -- -- --  BILITOT -- -- -- -- -- --  PROT -- -- -- -- -- --  ALBUMIN 3.0* 3.0* 3.0* -- 3.1* 3.2*   No results found for this basename: LIPASE:5,AMYLASE:5 in the last 168 hours No results found for this basename: AMMONIA:3 in the last 168 hours  CBC:  Lab 12/15/12 0500 12/14/12 0435 12/13/12 1322 12/13/12 0400 12/12/12 0345  WBC 10.3 8.0 7.5 7.4 8.0  NEUTROABS -- -- -- -- --  HGB 8.5* 8.5* 8.7* 8.5* 8.6*  HCT 29.3* 29.2* 29.9* 29.0* 30.2*  MCV 93.0 92.7 92.6 91.8 92.1  PLT 69* 98* 94* 96* 147*    Cardiac Enzymes: No results found for this basename: CKTOTAL:5,CKMB:5,CKMBINDEX:5,TROPONINI:5 in the last 168 hours  BNP: BNP (last 3 results)  Basename 11/19/12 1655  PROBNP 7488.0*     Medications:     Scheduled Medications:    . antiseptic oral rinse  15 mL Mouth Rinse BID  . cefTRIAXone (ROCEPHIN)  IV  1 g Intravenous Q24H  . darbepoetin (ARANESP) injection - DIALYSIS  150 mcg Intravenous Q Tue-HD  . diltiazem  120 mg Oral QHS  . doxercalciferol  4 mcg Intravenous Q T,Th,Sa-HD  . feeding supplement (NEPRO CARB STEADY)  237 mL Oral BID BM  . feeding supplement  30 mL Oral BID  . ferric gluconate  (FERRLECIT/NULECIT) IV  125 mg Intravenous Q Tue-HD  . heparin subcutaneous  5,000 Units Subcutaneous Q8H  . multivitamin  1 tablet Oral QHS  . senna-docusate  1 tablet Oral Daily  . sodium chloride  10 mL Intravenous Q12H  . traMADol  50 mg Oral Q12H    Infusions:    PRN Medications: acetaminophen, ALPRAZolam, feeding supplement (NEPRO CARB STEADY), hydrALAZINE   Assessment:   1. Acute on chronic diastolic CHF  2. Permanent atrial fibrillation  3. Morbid obesity  4. Acute/Chronic renal failure, stage 4       --solitary kidney  5. OSA  6. Physical debility/deconditioning - wheelchair bound  7. Noncompliance  8. Hypertension, poorly controlled 9. UTI, cipro started 12/10 10. Acute respiratory failure 11. Anemia - 12. Acute delirium 13. Arthritis - Lt knee 14. Thrombocytopenia  Plan/Discussion:    She is now dialysis dependent. Tolerated HD well. Weight around 200.  Awaiting perm cath and permanent access placed - should be done today. Can start coumadin after they are placed when ok with VVS.  Heparin off now on argatroban for possible HIT. 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  Then will plan d/c to SNF with arrangements for outpatient HD. Case Manager following. Appreciate Renal's care.    Length of Stay: 85 Gennesis Hogland,MD 10:15 AM  Addendum: Platelets continue to drop. Now 69k. Will switch heparin to argatroban. Check HIT panel.    Aydin Cavalieri,MD 2:35 PM

## 2012-12-16 ENCOUNTER — Inpatient Hospital Stay (HOSPITAL_COMMUNITY): Payer: Medicare Other

## 2012-12-16 ENCOUNTER — Encounter (HOSPITAL_COMMUNITY): Payer: Self-pay | Admitting: Anesthesiology

## 2012-12-16 ENCOUNTER — Encounter (HOSPITAL_COMMUNITY)
Admission: EM | Disposition: A | Discharge status: Nursing Home (Includes ICF) | Payer: Self-pay | Source: Home / Self Care | Attending: Internal Medicine

## 2012-12-16 ENCOUNTER — Inpatient Hospital Stay (HOSPITAL_COMMUNITY): Payer: Medicare Other | Admitting: Anesthesiology

## 2012-12-16 HISTORY — PX: INSERTION OF DIALYSIS CATHETER: SHX1324

## 2012-12-16 HISTORY — PX: AV FISTULA PLACEMENT: SHX1204

## 2012-12-16 LAB — CBC
Hemoglobin: 8.1 g/dL — ABNORMAL LOW (ref 12.0–15.0)
MCH: 27.3 pg (ref 26.0–34.0)
MCH: 27 pg (ref 26.0–34.0)
MCHC: 29.8 g/dL — ABNORMAL LOW (ref 30.0–36.0)
MCV: 91 fL (ref 78.0–100.0)
Platelets: 85 10*3/uL — ABNORMAL LOW (ref 150–400)
RBC: 3 MIL/uL — ABNORMAL LOW (ref 3.87–5.11)

## 2012-12-16 LAB — RENAL FUNCTION PANEL
CO2: 24 mEq/L (ref 19–32)
Calcium: 9.5 mg/dL (ref 8.4–10.5)
Chloride: 99 mEq/L (ref 96–112)
Creatinine, Ser: 3 mg/dL — ABNORMAL HIGH (ref 0.50–1.10)
GFR calc Af Amer: 16 mL/min — ABNORMAL LOW (ref >90)
Glucose, Bld: 170 mg/dL — ABNORMAL HIGH (ref 70–99)

## 2012-12-16 LAB — GLUCOSE, CAPILLARY: Glucose-Capillary: 100 mg/dL — ABNORMAL HIGH (ref 70–99)

## 2012-12-16 SURGERY — INSERTION OF DIALYSIS CATHETER
Anesthesia: Monitor Anesthesia Care | Site: Neck | Laterality: Left | Wound class: Clean

## 2012-12-16 MED ORDER — ONDANSETRON HCL 4 MG PO TABS: 4.0000 mg | ORAL_TABLET | Freq: Four times a day (QID) | ORAL | Status: DC | PRN

## 2012-12-16 MED ORDER — CALCIUM CARBONATE 1250 MG/5ML PO SUSP: 500.0000 mg | Freq: Four times a day (QID) | ORAL | Status: DC | PRN

## 2012-12-16 MED ORDER — HEPARIN SODIUM (PORCINE) 1000 UNIT/ML DIALYSIS
1000.0000 [IU] | INTRAMUSCULAR | Status: DC | PRN
  Filled 2012-12-16: qty 1

## 2012-12-16 MED ORDER — LIDOCAINE HCL (PF) 1 % IJ SOLN: 5.0000 mL | INTRAMUSCULAR | Status: DC | PRN

## 2012-12-16 MED ORDER — ALTEPLASE 2 MG IJ SOLR
2.0000 mg | Freq: Once | INTRAMUSCULAR | Status: AC | PRN
  Filled 2012-12-16: qty 2

## 2012-12-16 MED ORDER — HEPARIN SODIUM (PORCINE) 1000 UNIT/ML IJ SOLN
INTRAMUSCULAR | Status: AC
  Filled 2012-12-16: qty 1

## 2012-12-16 MED ORDER — PROPOFOL INFUSION 10 MG/ML OPTIME
INTRAVENOUS | Status: DC | PRN
  Administered 2012-12-16: 75 ug/kg/min via INTRAVENOUS

## 2012-12-16 MED ORDER — HYDROXYZINE HCL 25 MG PO TABS
25.0000 mg | ORAL_TABLET | Freq: Three times a day (TID) | ORAL | Status: DC | PRN
  Filled 2012-12-16: qty 1

## 2012-12-16 MED ORDER — ONDANSETRON HCL 4 MG/2ML IJ SOLN: 4.0000 mg | Freq: Once | INTRAMUSCULAR | Status: AC | PRN

## 2012-12-16 MED ORDER — ACETAMINOPHEN 325 MG PO TABS: 650.0000 mg | ORAL_TABLET | Freq: Four times a day (QID) | ORAL | Status: DC | PRN

## 2012-12-16 MED ORDER — ACETAMINOPHEN 650 MG RE SUPP: 650.0000 mg | Freq: Four times a day (QID) | RECTAL | Status: DC | PRN

## 2012-12-16 MED ORDER — PENTAFLUOROPROP-TETRAFLUOROETH EX AERO: 1.0000 "application " | INHALATION_SPRAY | CUTANEOUS | Status: DC | PRN

## 2012-12-16 MED ORDER — LIDOCAINE-EPINEPHRINE (PF) 1 %-1:200000 IJ SOLN
INTRAMUSCULAR | Status: DC | PRN
  Administered 2012-12-16: 8 mL

## 2012-12-16 MED ORDER — SODIUM CHLORIDE 0.9 % IV SOLN: 100.0000 mL | INTRAVENOUS | Status: DC | PRN

## 2012-12-16 MED ORDER — ONDANSETRON HCL 4 MG/2ML IJ SOLN: 4.0000 mg | Freq: Four times a day (QID) | INTRAMUSCULAR | Status: DC | PRN

## 2012-12-16 MED ORDER — SODIUM CHLORIDE 0.9 % IV SOLN
INTRAVENOUS | Status: DC | PRN
  Administered 2012-12-16: 500 mL via INTRAMUSCULAR

## 2012-12-16 MED ORDER — HYDROMORPHONE HCL PF 1 MG/ML IJ SOLN
0.2500 mg | INTRAMUSCULAR | Status: DC | PRN
  Administered 2012-12-16 – 2012-12-17 (×2): 0.5 mg via INTRAVENOUS
  Filled 2012-12-16 (×2): qty 1

## 2012-12-16 MED ORDER — SODIUM CHLORIDE 0.9 % IV SOLN
100.0000 mL | INTRAVENOUS | Status: DC | PRN
  Administered 2012-12-18: 07:00:00 via INTRAVENOUS

## 2012-12-16 MED ORDER — METOPROLOL TARTRATE 1 MG/ML IV SOLN
INTRAVENOUS | Status: DC | PRN
  Administered 2012-12-16: 5 mg via INTRAVENOUS

## 2012-12-16 MED ORDER — NEPRO/CARBSTEADY PO LIQD: 237.0000 mL | Freq: Three times a day (TID) | ORAL | Status: DC | PRN

## 2012-12-16 MED ORDER — FENTANYL CITRATE 0.05 MG/ML IJ SOLN
INTRAMUSCULAR | Status: DC | PRN
  Administered 2012-12-16: 25 ug via INTRAVENOUS

## 2012-12-16 MED ORDER — SODIUM CHLORIDE 0.9 % IV SOLN
INTRAVENOUS | Status: DC | PRN
  Administered 2012-12-16: 08:00:00 via INTRAVENOUS

## 2012-12-16 MED ORDER — 0.9 % SODIUM CHLORIDE (POUR BTL) OPTIME
TOPICAL | Status: DC | PRN
  Administered 2012-12-16: 1000 mL

## 2012-12-16 MED ORDER — SORBITOL 70 % SOLN: 30.0000 mL | Status: DC | PRN

## 2012-12-16 MED ORDER — CAMPHOR-MENTHOL 0.5-0.5 % EX LOTN
1.0000 "application " | TOPICAL_LOTION | Freq: Three times a day (TID) | CUTANEOUS | Status: DC | PRN
  Filled 2012-12-16: qty 222

## 2012-12-16 MED ORDER — DOCUSATE SODIUM 283 MG RE ENEM: 1.0000 | ENEMA | RECTAL | Status: DC | PRN

## 2012-12-16 MED ORDER — NEPRO/CARBSTEADY PO LIQD: 237.0000 mL | ORAL | Status: DC | PRN

## 2012-12-16 MED ORDER — LIDOCAINE-EPINEPHRINE (PF) 1 %-1:200000 IJ SOLN
INTRAMUSCULAR | Status: AC
  Filled 2012-12-16: qty 10

## 2012-12-16 MED ORDER — PHENYLEPHRINE HCL 10 MG/ML IJ SOLN
INTRAMUSCULAR | Status: DC | PRN
  Administered 2012-12-16 (×3): 80 ug via INTRAVENOUS

## 2012-12-16 MED ORDER — ZOLPIDEM TARTRATE 5 MG PO TABS: 5.0000 mg | ORAL_TABLET | Freq: Every evening | ORAL | Status: DC | PRN

## 2012-12-16 MED ORDER — HEPARIN SODIUM (PORCINE) 1000 UNIT/ML DIALYSIS
100.0000 [IU]/kg | INTRAMUSCULAR | Status: DC | PRN
  Filled 2012-12-16: qty 10

## 2012-12-16 MED ORDER — SODIUM CHLORIDE 0.9 % IR SOLN
Status: DC | PRN
  Administered 2012-12-16: 08:00:00

## 2012-12-16 MED ORDER — LIDOCAINE-PRILOCAINE 2.5-2.5 % EX CREA: 1.0000 "application " | TOPICAL_CREAM | CUTANEOUS | Status: DC | PRN

## 2012-12-16 MED ORDER — ARGATROBAN 50 MG/50ML IV SOLN
0.3200 ug/kg/min | INTRAVENOUS | Status: DC
  Administered 2012-12-16: 0.4 ug/kg/min via INTRAVENOUS
  Administered 2012-12-17 (×2): 0.32 ug/kg/min via INTRAVENOUS
  Filled 2012-12-16 (×5): qty 50

## 2012-12-16 MED ORDER — HEMOSTATIC AGENTS (NO CHARGE) OPTIME
TOPICAL | Status: DC | PRN
  Administered 2012-12-16: 1

## 2012-12-16 SURGICAL SUPPLY — 56 items
BAG DECANTER FOR FLEXI CONT (MISCELLANEOUS) ×6 IMPLANT
CANISTER SUCTION 2500CC (MISCELLANEOUS) ×3 IMPLANT
CATH BEACON 5.038 65CM KMP-01 (CATHETERS) ×6 IMPLANT
CATH CANNON HEMO 15F 50CM (CATHETERS) IMPLANT
CATH CANNON HEMO 15FR 19 (HEMODIALYSIS SUPPLIES) IMPLANT
CATH CANNON HEMO 15FR 23CM (HEMODIALYSIS SUPPLIES) ×6 IMPLANT
CATH CANNON HEMO 15FR 31CM (HEMODIALYSIS SUPPLIES) IMPLANT
CATH CANNON HEMO 15FR 32CM (HEMODIALYSIS SUPPLIES) IMPLANT
CLIP TI MEDIUM 6 (CLIP) ×3 IMPLANT
CLIP TI WIDE RED SMALL 6 (CLIP) ×3 IMPLANT
CLOTH BEACON ORANGE TIMEOUT ST (SAFETY) ×3 IMPLANT
COVER PROBE W GEL 5X96 (DRAPES) ×3 IMPLANT
COVER SURGICAL LIGHT HANDLE (MISCELLANEOUS) ×3 IMPLANT
DERMABOND ADVANCED (GAUZE/BANDAGES/DRESSINGS) ×2
DERMABOND ADVANCED .7 DNX12 (GAUZE/BANDAGES/DRESSINGS) ×4 IMPLANT
DRAPE C-ARM 42X72 X-RAY (DRAPES) ×3 IMPLANT
DRAPE CHEST BREAST 15X10 FENES (DRAPES) ×3 IMPLANT
ELECT REM PT RETURN 9FT ADLT (ELECTROSURGICAL) ×3
ELECTRODE REM PT RTRN 9FT ADLT (ELECTROSURGICAL) ×2 IMPLANT
GAUZE SPONGE 2X2 8PLY STRL LF (GAUZE/BANDAGES/DRESSINGS) ×4 IMPLANT
GAUZE SPONGE 4X4 16PLY XRAY LF (GAUZE/BANDAGES/DRESSINGS) ×3 IMPLANT
GLOVE BIOGEL PI IND STRL 7.5 (GLOVE) ×2 IMPLANT
GLOVE BIOGEL PI INDICATOR 7.5 (GLOVE) ×1
GLOVE SURG SS PI 7.5 STRL IVOR (GLOVE) ×3 IMPLANT
GOWN PREVENTION PLUS XXLARGE (GOWN DISPOSABLE) ×3 IMPLANT
GOWN STRL NON-REIN LRG LVL3 (GOWN DISPOSABLE) ×6 IMPLANT
HEMOSTAT SNOW SURGICEL 2X4 (HEMOSTASIS) ×3 IMPLANT
HEMOSTAT SURGICEL 2X14 (HEMOSTASIS) IMPLANT
KIT BASIN OR (CUSTOM PROCEDURE TRAY) ×3 IMPLANT
KIT ROOM TURNOVER OR (KITS) ×3 IMPLANT
NEEDLE 18GX1X1/2 (RX/OR ONLY) (NEEDLE) ×3 IMPLANT
NEEDLE HYPO 25GX1X1/2 BEV (NEEDLE) ×3 IMPLANT
NS IRRIG 1000ML POUR BTL (IV SOLUTION) ×3 IMPLANT
PACK CV ACCESS (CUSTOM PROCEDURE TRAY) ×3 IMPLANT
PACK SURGICAL SETUP 50X90 (CUSTOM PROCEDURE TRAY) ×3 IMPLANT
PAD ARMBOARD 7.5X6 YLW CONV (MISCELLANEOUS) ×6 IMPLANT
SOAP 2 % CHG 4 OZ (WOUND CARE) ×3 IMPLANT
SPONGE GAUZE 2X2 STER 10/PKG (GAUZE/BANDAGES/DRESSINGS) ×2
SUT ETHILON 3 0 PS 1 (SUTURE) ×3 IMPLANT
SUT PROLENE 6 0 CC (SUTURE) ×3 IMPLANT
SUT VIC AB 3-0 SH 27 (SUTURE) ×1
SUT VIC AB 3-0 SH 27X BRD (SUTURE) ×2 IMPLANT
SUT VICRYL 4-0 PS2 18IN ABS (SUTURE) ×3 IMPLANT
SYR 20CC LL (SYRINGE) ×6 IMPLANT
SYR 30ML LL (SYRINGE) IMPLANT
SYR 5ML LL (SYRINGE) ×3 IMPLANT
SYR CONTROL 10ML LL (SYRINGE) ×3 IMPLANT
SYRINGE 10CC LL (SYRINGE) ×3 IMPLANT
TAPE CLOTH SOFT 2X10 (GAUZE/BANDAGES/DRESSINGS) ×3 IMPLANT
TOWEL OR 17X24 6PK STRL BLUE (TOWEL DISPOSABLE) ×3 IMPLANT
TOWEL OR 17X26 10 PK STRL BLUE (TOWEL DISPOSABLE) ×3 IMPLANT
UNDERPAD 30X30 INCONTINENT (UNDERPADS AND DIAPERS) ×3 IMPLANT
WATER STERILE IRR 1000ML POUR (IV SOLUTION) ×3 IMPLANT
WIRE AMPLATZ SS-J .035X180CM (WIRE) ×3 IMPLANT
WIRE AMPLATZ SSTIFF .035X260CM (WIRE) ×3 IMPLANT
WIRE BENTSON .035X145CM (WIRE) ×3 IMPLANT

## 2012-12-16 NOTE — Anesthesia Postprocedure Evaluation (Signed)
  Anesthesia Post-op Note  Patient: Desiree Pierce  Procedure(s) Performed: Procedure(s) (LRB) with comments: INSERTION OF DIALYSIS CATHETER (Left) ARTERIOVENOUS (AV) FISTULA CREATION (Left)  Patient Location: PACU  Anesthesia Type:MAC  Level of Consciousness: awake, alert , oriented and patient cooperative  Airway and Oxygen Therapy: Patient Spontanous Breathing  Post-op Pain: mild  Post-op Assessment: Post-op Vital signs reviewed, Patient's Cardiovascular Status Stable, Respiratory Function Stable, Patent Airway, No signs of Nausea or vomiting and Pain level controlled  Post-op Vital Signs: stable  Complications: No apparent anesthesia complications

## 2012-12-16 NOTE — Progress Notes (Signed)
ANTICOAGULATION CONSULT NOTE - Follow Up Consult  Pharmacy Consult for argatroban Indication: possible HIT  No Known Allergies  Patient Measurements: Height: 5\' 2"  (157.5 cm) Weight: 203 lb 4.2 oz (92.2 kg) IBW/kg (Calculated) : 50.1    Vital Signs: Temp: 97.5 F (36.4 C) (12/26 1147) Temp src: Oral (12/26 1147) BP: 148/83 mmHg (12/26 1147) Pulse Rate: 95  (12/26 1128)  Labs:  Basename 12/16/12 0724 12/15/12 2141 12/15/12 1743 12/15/12 1451 12/15/12 0500 12/14/12 0500 12/14/12 0435  HGB 9.0* -- -- -- 8.5* -- --  HCT 30.2* -- -- -- 29.3* -- 29.2*  PLT 85* -- -- -- 69* -- 98*  APTT -- 91* 86* 45* -- -- --  LABPROT -- -- -- -- 15.8* -- 16.2*  INR -- -- -- -- 1.29 -- 1.33  HEPARINUNFRC -- -- -- -- -- -- --  CREATININE -- -- -- -- 2.12* 2.55* --  CKTOTAL -- -- -- -- -- -- --  CKMB -- -- -- -- -- -- --  TROPONINI -- -- -- -- -- -- --    Estimated Creatinine Clearance: 22.7 ml/min (by C-G formula based on Cr of 2.12).  Assessment: Patient is a 78 y.o F on coumadin PTA for afib.  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  HIT panel pending.  Argatroban was d/ced at midnight last night in anticipation for surgical procedure today.  Now s/p left insertion of dialysis catheter and creation of left AV fistula creation.  Spoke to Dr. Myra Gianotti, ok to resume argatroban now for patient.  Goal of Therapy:  aPTT 50-90 seconds Monitor platelets by anticoagulation protocol: Yes   Plan:  1) resume argatroban at 0.4 mcg/kg/min 2) check 2 hr aPTT  Tajai Suder P 12/16/2012,3:38 PM

## 2012-12-16 NOTE — Progress Notes (Signed)
PT/OT Cancellation Note  Patient Details Name: Desiree Pierce MRN: [id number] DOB: 1931-09-08   Cancelled Treatment:    Reason Eval/Treat Not Completed: Patient at procedure or test/ unavailable (in OR).  12/16/2012 Cipriano Mile OTR/L Pager [phone] Office [phone]

## 2012-12-16 NOTE — Progress Notes (Signed)
See OT note.    Silvester Reierson, PT [phone]  

## 2012-12-16 NOTE — OR Nursing (Addendum)
Insertion of Diatek procedure 1 ended at 0920 Procedure 2 Creation of Left Arteriovenous Fistula Started at [phone]

## 2012-12-16 NOTE — H&P (Signed)
[facility] [place]  CONSULT NOTE  12/14/2012  DOB: [birth year]  MRN : [id number]  CC:CKD  Referring Physician:James L Deterding, MD  History of Present Illness:Desiree Pierce is a 78 y.o. female with PMHx s/f chronic diastolic CHF, congenital solitary kidney, CKD (stage IV, baseline Cr about 2mg /dl), COPD + OSA + morbid obesity, secondary pulmonary HTN, permanent atrial fibrillation, OSA, GERD who presents to [facility] ED on 11/19/12 with shortness of breath. Baseline weight in June 195 lbs and weight at admission at 232 lbs. She was treated with IV diuretics for acute on chronic CHF. Has had worsening of renal function with elevation CVP. She underwent cardiac cath revealing severe pulmonary hypertension, and PCWP of 24 and was started on trial of milrinone and sildenafil with poor UOP and low BP. We are asked to see now due to rising serum creatinine to 4.38. Diuretics have been held. I s and O s reveal -91478 ccs since admission. Current UOP has diminished to 250 cc last 24 hrs and 75 cc recorded today.  We have been consulted for diatek catheter placement and evaluation for access via fistula or graft.  Past Medical History   Diagnosis  Date   .  Morbid obesity    .  Atrial fibrillation    .  Arthritis    .  Allergy history unknown    .  Hypertension      Poorly controlled   .  CHF (congestive heart failure)    .  Pulmonary hypertension    .  GERD (gastroesophageal reflux disease)    .  COPD (chronic obstructive pulmonary disease)    .  OA (osteoarthritis)    .  Solitary kidney, congenital     Past Surgical History   Procedure  Date   .  Total abdominal hysterectomy    .  Total knee arthroplasty      Right knee   .  Ankle fracture surgery      Right ankle repair    ROS: [x]  Positive [ ]  Denies  General: [ ]  Weight loss, [ ]  Fever, [ ]  chills  Neurologic: [ ]  Dizziness, [ ]  Blackouts, [ ]  Seizure  [ ]  Stroke, [ ]  "Mini stroke", [ ]  Slurred speech, [ ]  Temporary  blindness; [x]  weakness in arms or legs, [ ]  Hoarseness  Cardiac: [ ]  Chest pain/pressure, [ ]  Shortness of breath at rest [x ] Shortness of breath with exertion, [ ]  Atrial fibrillation or irregular heartbeat  Vascular: [ ]  Pain in legs with walking, [ ]  Pain in legs at rest, [ ]  Pain in legs at night,  [ ]  Non-healing ulcer, [ ]  Blood clot in vein/DVT,  Pulmonary: [ ]  Home oxygen, [ ]  Productive cough, [ ]  Coughing up blood, [ ]  Asthma,  [ ]  Wheezing  Musculoskeletal: [x ] Arthritis, [x ] Low back pain, [x ] Joint pain  Hematologic: [ ]  Easy Bruising, [ ]  Anemia; [ ]  Hepatitis  Gastrointestinal: [ ]  Blood in stool, [ ]  Gastroesophageal Reflux/heartburn, [ ]  Trouble swallowing  Urinary: [x ] chronic Kidney disease, [x ] on HD - [ ]  MWF or [x ] TTHS, [ ]  Burning with urination, [ ]  Difficulty urinating congenital solitary kidney  Skin: [ ]  Rashes, [ ]  Wounds  Psychological: [x ] Anxiety, [ ]  Depression  Social History  History   Substance Use Topics   .  Smoking status:  Former Smoker -- 0.1 packs/day for 5 years  Quit date:  12/22/1968   .  Smokeless tobacco:  Not on file   .  Alcohol Use:  No    Family History  Family History   Problem  Relation  Age of Onset   .  Allergies  Mother    .  Heart disease  Mother    .  Asthma  Father    .  Diabetes type II  Maternal Grandmother     No Known Allergies  Current Facility-Administered Medications   Medication  Dose  Route  Frequency  Provider  Last Rate  Last Dose   .  0.45 % sodium chloride infusion   Intravenous  Continuous  Trevor Iha, MD  10 mL/hr at 12/13/12 1033    .  0.9 % sodium chloride infusion  100 mL  Intravenous  PRN  Llana Aliment Deterding, MD     .  0.9 % sodium chloride infusion  100 mL  Intravenous  PRN  Trevor Iha, MD     .  acetaminophen (TYLENOL) tablet 650 mg  650 mg  Oral  Q4H PRN  Gery Pray, PA-C   650 mg at 12/10/12 1939   .  ALPRAZolam Prudy Feeler) tablet 0.25 mg  0.25 mg  Oral  BID PRN  Gery Pray, PA-C   0.25 mg at 12/10/12 2157   .  antiseptic oral rinse (BIOTENE) solution 15 mL  15 mL  Mouth Rinse  BID  Dolores Patty, MD   15 mL at 12/13/12 0800   .  cefTRIAXone (ROCEPHIN) 1 g in dextrose 5 % 50 mL IVPB  1 g  Intravenous  Q24H  Dolores Patty, MD   1 g at 12/13/12 1023   .  darbepoetin (ARANESP) injection 150 mcg  150 mcg  Intravenous  Q Tue-HD  Trevor Iha, MD     .  diltiazem (CARDIZEM CD) 24 hr capsule 120 mg  120 mg  Oral  QHS  Trevor Iha, MD   120 mg at 12/13/12 2105   .  doxercalciferol (HECTOROL) injection 4 mcg  4 mcg  Intravenous  Q T,Th,Sa-HD  Trevor Iha, MD   4 mcg at 12/14/12 0901   .  feeding supplement (NEPRO CARB STEADY) liquid 237 mL  237 mL  Oral  PRN  Tonye Becket, RD   150 mL at 12/11/12 0400   .  feeding supplement (NEPRO CARB STEADY) liquid 237 mL  237 mL  Oral  BID BM  Sadie Haber, MD   237 mL at 12/13/12 1318   .  feeding supplement (NEPRO CARB STEADY) liquid 237 mL  237 mL  Oral  PRN  Trevor Iha, MD     .  ferric gluconate (NULECIT) 125 mg in sodium chloride 0.9 % 100 mL IVPB  125 mg  Intravenous  Q Tue-HD  Sadie Haber, MD     .  heparin injection 1,000 Units  1,000 Units  Dialysis  PRN  Trevor Iha, MD     .  heparin injection 1,000 Units  1,000 Units  Dialysis  PRN  Trevor Iha, MD     .  heparin injection 3,700 Units  40 Units/kg  Dialysis  Once in dialysis  Trevor Iha, MD     .  heparin injection 5,000 Units  5,000 Units  Subcutaneous  Q8H  Dolores Patty, MD   5,000 Units at 12/14/12 0554   .  heparin injection 9,200 Units  100 Units/kg  Dialysis  PRN  Trevor Iha, MD     .  lidocaine (XYLOCAINE) 1 % injection 5 mL  5 mL  Intradermal  PRN  Trevor Iha, MD     .  lidocaine-prilocaine (EMLA) cream 1 application  1 application  Topical  PRN  Trevor Iha, MD     .  multivitamin (RENA-VIT) tablet 1 tablet  1 tablet  Oral  QHS  Trevor Iha, MD   1 tablet at  12/13/12 2108   .  pentafluoroprop-tetrafluoroeth (GEBAUERS) aerosol 1 application  1 application  Topical  PRN  Trevor Iha, MD     .  senna-docusate (Senokot-S) tablet 1 tablet  1 tablet  Oral  Daily  Gery Pray, PA-C   1 tablet at 12/13/12 1024   .  sodium chloride 0.9 % injection 10 mL  10 mL  Intravenous  Q12H  Dolores Patty, MD   10 mL at 12/13/12 2106   .  traMADol (ULTRAM) tablet 50 mg  50 mg  Oral  Q12H  Hadassah Pais, PA   50 mg at 12/13/12 2105    Imaging:  No results found.  Significant Diagnostic Studies:  CBC  Lab Results   Component  Value  Date    WBC  8.0  12/14/2012    HGB  8.5*  12/14/2012    HCT  29.2*  12/14/2012    MCV  92.7  12/14/2012    PLT  98*  12/14/2012    BMET    Component  Value  Date/Time    NA  139  12/14/2012 0500    K  3.9  12/14/2012 0500    CL  102  12/14/2012 0500    CO2  28  12/14/2012 0500    GLUCOSE  87  12/14/2012 0500    BUN  32*  12/14/2012 0500    CREATININE  2.55*  12/14/2012 0500    CALCIUM  9.3  12/14/2012 0500    GFRNONAA  17*  12/14/2012 0500    GFRAA  19*  12/14/2012 0500    COAG  Lab Results   Component  Value  Date    INR  1.33  12/14/2012    INR  1.50*  12/13/2012    INR  1.61*  12/12/2012    No results found for this basename: PTT    Physical Examination  BP Readings from Last 3 Encounters:   12/14/12  137/81   12/14/12  137/81   06/17/12  140/82    Temp Readings from Last 3 Encounters:   12/14/12  97.9 F (36.6 C) Oral   12/14/12  97.9 F (36.6 C) Oral    SpO2 Readings from Last 3 Encounters:   12/14/12  100%   12/14/12  100%   06/17/12  95%    Pulse Readings from Last 3 Encounters:   12/14/12  92   12/14/12  92   06/17/12  76    General: WDWN in NAD  Gait: non ambulatory  HENT: WNL  Eyes: Pupils equal  Pulmonary: normal non-labored breathing , without Rales, rhonchi, wheezing  Cardiac: permanent atrial fibrillation  chronic diastolic CHF ( Baseline weight 200-205 lbs.),   No carotid bruits  Abdomen: soft, NT, no masses  Skin: no rashes, ulcers noted  Vascular Exam/Pulses:Palpable radial pulses bilaterally  Extremities without ischemic changes, no Gangrene , no cellulitis; no open wounds;  Musculoskeletal:  no muscle wasting or atrophy  Neurologic: A&O X 3; Appropriate Affect ;  SENSATION: normal;  MOTOR FUNCTION: Pt has poor and not equal strength in all extremities - 4/5 secondary to multiple joint arthritis  Speech is fluent/normal  Non-Invasive Vascular Imaging:  Vein mapping completed  ASSESSMENT/PLAN:  We will plan diatek catheter placement on Thursday 12-16-2012  Access will be planned as well once Dr. Myra Gianotti has reviewed the vein mapping.  We will proceed with Left radio cephalic fistula creation.  NPO 12-15-2012 at [place] I have seen and examined the above patient. I agree with the above noted. I have been asked to place a permanent dialysis catheter as well as to evaluate her for fistula access. I have reviewed her vein mapping. She has excellent bilateral cephalic veins. Since the patient is right-handed I have recommended beginning with a left radiocephalic fistula. I discussed the details of the procedure with the patient's daughter and son-in-law. They're well aware of dialysis as the son-in-law is currently on dialysis. They understand the need for future interventions to get the fistula to mature. We discussed the risk of steal syndrome as well. I will plan on doing her operation Thursday morning. 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 Her platelet count has dropped down to 60,000 today. She is being started on our gastric banding currently. She is on Coumadin for atrial fibrillation. She has not had any doses for quite some time and her INR is near normal. I have discussed this with Dr. Gala Romney. I will proceed with her procedure tomorrow morning unless her platelet count drops again. If that were to happen her procedure would be  canceled. The family also understands this. I will also have to stop her gastric band at midnight.

## 2012-12-16 NOTE — Progress Notes (Signed)
ANTICOAGULATION CONSULT NOTE - Follow Up Consult  Pharmacy Consult for Argatroban Indication: possible HIT; atrial fibrillation  No Known Allergies  Patient Measurements: Height: 5\' 2"  (157.5 cm) Weight: 203 lb 4.2 oz (92.2 kg) IBW/kg (Calculated) : 50.1    Vital Signs: Temp: 97.5 F (36.4 C) (12/26 1800) Temp src: Oral (12/26 1800) BP: 146/85 mmHg (12/26 1800) Pulse Rate: 105  (12/26 1800)  Labs:  Basename 12/16/12 2206 12/16/12 2034 12/16/12 2033 12/16/12 0724 12/15/12 0500 12/14/12 0500 12/14/12 0435  HGB -- -- 8.1* 9.0* -- -- --  HCT -- -- 27.3* 30.2* 29.3* -- --  PLT -- -- 76* 85* 69* -- --  APTT 80* 76* 74* -- -- -- --  LABPROT -- -- 22.9* -- 15.8* -- 16.2*  INR -- -- 2.13* -- 1.29 -- 1.33  HEPARINUNFRC -- -- -- -- -- -- --  CREATININE -- -- 3.00* -- 2.12* 2.55* --  CKTOTAL -- -- -- -- -- -- --  CKMB -- -- -- -- -- -- --  TROPONINI -- -- -- -- -- -- --    Estimated Creatinine Clearance: 16.1 ml/min (by C-G formula based on Cr of 3).  Assessment: Patient is a 78 y.o F on coumadin PTA for atrial fibrillation, now on argatroban for suspected HIT.  HIT panel pending. S/p left insertion of dialysis catheter and creation of left AV fistula creation earlier today with argatroban resumed ~ 6pm. Coumadin is on hold.   Confirmatory aPTT (80 seconds) remains at-goal on argatroban 0.4 mcg/kg/min.   Goal of Therapy:  aPTT 50-90 seconds Monitor platelets by anticoagulation protocol: Yes   Plan:  1. Continue IV argatroban infusion at 0.4 mcg/kg/min 2. As aPTT at-goal x 2, will monitor daily aPTT.  3. Daily CBC 4. Follow-up HIT panel.  Emeline Gins 12/16/2012,10:43 PM

## 2012-12-16 NOTE — Anesthesia Preprocedure Evaluation (Addendum)
Anesthesia Evaluation  Patient identified by MRN, date of birth, ID band Patient awake    Reviewed: Allergy & Precautions, H&P , NPO status , Patient's Chart, lab work & pertinent test results  Airway Mallampati: I TM Distance: >3 FB Neck ROM: full    Dental  (+) Edentulous Upper and Edentulous Lower   Pulmonary COPDformer smoker,          Cardiovascular hypertension, +CHF + dysrhythmias Atrial Fibrillation Rhythm:irregular Rate:Normal     Neuro/Psych    GI/Hepatic GERD-  ,  Endo/Other  Morbid obesity  Renal/GU ESRF and DialysisRenal disease     Musculoskeletal   Abdominal   Peds  Hematology   Anesthesia Other Findings   Reproductive/Obstetrics                          Anesthesia Physical Anesthesia Plan  ASA: III  Anesthesia Plan: MAC and General   Post-op Pain Management:    Induction: Intravenous  Airway Management Planned: Oral ETT  Additional Equipment:   Intra-op Plan:   Post-operative Plan: Extubation in OR  Informed Consent: I have reviewed the patients History and Physical, chart, labs and discussed the procedure including the risks, benefits and alternatives for the proposed anesthesia with the patient or authorized representative who has indicated his/her understanding and acceptance.     Plan Discussed with: CRNA, Anesthesiologist and Surgeon  Anesthesia Plan Comments:         Anesthesia Quick Evaluation

## 2012-12-16 NOTE — Progress Notes (Signed)
Utilization review completed.  

## 2012-12-16 NOTE — Transfer of Care (Signed)
Immediate Anesthesia Transfer of Care Note  Patient: Desiree Pierce  Procedure(s) Performed: Procedure(s) (LRB) with comments: INSERTION OF DIALYSIS CATHETER (Left) ARTERIOVENOUS (AV) FISTULA CREATION (Left)  Patient Location: PACU  Anesthesia Type:MAC  Level of Consciousness: awake and patient cooperative  Airway & Oxygen Therapy: Patient Spontanous Breathing and Patient connected to nasal cannula oxygen  Post-op Assessment: Report given to PACU RN, Post -op Vital signs reviewed and stable and Patient moving all extremities X 4  Post vital signs: Reviewed and stable  Complications: No apparent anesthesia complications

## 2012-12-16 NOTE — Progress Notes (Signed)
Spoke with Genelle Bal SW regarding disposition. Ok to discharge to [facility] tomorrow if ok with renal.   CLEGG,AMY NP-C 4:17 PM

## 2012-12-16 NOTE — OR Nursing (Signed)
[place] an [place] and calls out often for "Tanika" / reorientation does not convince her otherwise.

## 2012-12-16 NOTE — Progress Notes (Signed)
ANTICOAGULATION CONSULT NOTE - Follow Up Consult  Pharmacy Consult for Argatroban Indication: possible HIT; atrial fibrillation  No Known Allergies  Patient Measurements: Height: 5\' 2"  (157.5 cm) Weight: 203 lb 4.2 oz (92.2 kg) IBW/kg (Calculated) : 50.1    Vital Signs: Temp: 97.5 F (36.4 C) (12/26 1800) Temp src: Oral (12/26 1800) BP: 146/85 mmHg (12/26 1800) Pulse Rate: 105  (12/26 1800)  Labs:  Alvira Philips 12/16/12 2034 12/16/12 2033 12/16/12 0724 12/15/12 2141 12/15/12 0500 12/14/12 0500 12/14/12 0435  HGB -- 8.1* 9.0* -- -- -- --  HCT -- 27.3* 30.2* -- 29.3* -- --  PLT -- 76* 85* -- 69* -- --  APTT 76* 74* -- 91* -- -- --  LABPROT -- 22.9* -- -- 15.8* -- 16.2*  INR -- 2.13* -- -- 1.29 -- 1.33  HEPARINUNFRC -- -- -- -- -- -- --  CREATININE -- -- -- -- 2.12* 2.55* --  CKTOTAL -- -- -- -- -- -- --  CKMB -- -- -- -- -- -- --  TROPONINI -- -- -- -- -- -- --    Estimated Creatinine Clearance: 22.7 ml/min (by C-G formula based on Cr of 2.12).  Assessment: Patient is a 78 y.o F on coumadin PTA for afib.  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  HIT panel pending. S/p left insertion of dialysis catheter and creation of left AV fistula creation earlier today..Argatroban was resumed ~6pm. Initial PTT ~2.5 hrs after resuming is therapeutic. No bleeding noted. Coumadin is on hold.   Goal of Therapy:  aPTT 50-90 seconds Monitor platelets by anticoagulation protocol: Yes   Plan:  Continue Argatroban at 0.4 mcg/kg/min Recheck  aPTT in ~ 2 hours to confirm. Once therapeutic x 2, will change to daily aPTT. Continue daily CBC; monitor for bleeding. Follow-up HIT panel.  Dennie Fetters, [place] Pager: [phone] 12/16/2012,9:24 PM

## 2012-12-16 NOTE — Discharge Summary (Signed)
Advanced Heart Failure Team  Discharge Summary   Patient ID: Desiree Pierce MRN: [id number], DOB/AGE: [birth year] 76 y.o. Admit date: 11/19/2012 D/C date:     12/19/2012   Admit date: 11/19/12 Discharge date: 12/19/11  Primary Discharge Diagnoses:   1. Acute on chronic renal failure - now dialysis dependent 2.  Acute on chronic diastolic heart failure  Secondary Discharge Diagnoses:   1. Permanent atrial fibrillation  2. Hypertension 4. H/o solitary kidney  5. OSA  6. Physical debility/deconditioning - wheelchair bound  7. Rheumatoid arthritis 8. 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 Anemia of chronic disease  Hospital Course:   Desiree Pierce is a delightful 78 yo female with h/o chronic diastolic CHF,congenital solitary kidney, CKD (stage IV, baseline Cr 2.0-2.5), OSA , obesity with secondary pulmonary HTN, permanent atrial fibrillation and OSA.  Admitted 11/19/12 with decompensated HF.  Admit weight 232 pounds.   11/22/12 ECHO EF 60% Moderate LVH Mild Mitral regurgitation. + moderateRV dysfunction   Upon admission she underwent diuresis with IV lasix and we were able to remove about 20 pounds. However CVP remained in the 20 mmHG range. After discussions with her and her daughter we decided to proceed with RHC to further optimize her hemodynamics and assess for pulmonary HTN.   12/01/12: RHC  RA = 21  RV = 80/11/19  PA = 88/34 (56)  PCW = 24 v = 35  Fick cardiac output/index = 4.9/2.5  PVR = 7.3 woods  FA sat = 96%  PA sat = 55%, 57%   Given markedly elevated filling pressures and secondary HTN, post cath she was started on lasix gtt, sildenafil and milrinone. She sursequently developed progressive renal failure with her creatinine climbing from 2.5 almost up to 5. She became anuric and CVP climbed to 40. Renal was consulted and CVVHD was initiated. She underwent CVVHD from 12/05/12-12/20. With removal of 30 pounds of fluid.(weight down to 195) Transitioned to HD on 12/22,  tolerated well.   During the hospitalization her coumadin was held (was taking for AF) and she was treated with IV heparin. Developed thrombocytopenia with platelets down to 69K. Heparin stopped on 12/25 and HIT panel sent (pending); Switched to argatroban and PLTs back up to 85K on 12/26. Seen by Vascular Surgery and underwent placement of perm cath and AV fistula on 12/26. Perm cath replaced on 12/28 due to kinking. Coumadin re-initiated post-op.  During hospitalization BP was somewhat labile. Her home anti-hypertensive meds were held except for her diltiazem which was used for rate control for AF. She was treated was PRN hydralazine. Also seen by PT who recommended SNF vs LTAC for severe deconditioning.   [facility] for ongoing care on 12/29. HIT panel pending at time of DC. Can stop argatroban and use SCDs for DVT prophylaxis. Continue to load coumadin.  Pharmacy dosed patient with Coumadin 4 mg today.  INRs will be monitored for dose adjustment at [facility].   Discharge Weight Range: 195-200 pounds Discharge Vitals: Blood pressure 158/94, pulse 100, temperature 97.6 F (36.4 C), temperature source Oral, resp. rate 20, height 5\' 2"  (1.575 m), weight 204 lb 9.4 oz (92.8 kg), SpO2 100.00%.  Labs: Lab Results  Component Value Date   WBC 7.2 12/19/2012   HGB 7.9* 12/19/2012   HCT 26.7* 12/19/2012   MCV 92.4 12/19/2012   PLT 128* 12/19/2012     Lab 12/19/12 0500 12/17/12 0700  NA 138 --  K 4.6 --  CL 97 --  CO2 25 --  BUN 81* --  CREATININE 3.60* --  CALCIUM 9.5 --  PROT -- 7.6  BILITOT -- 0.5  ALKPHOS -- 88  ALT -- 13  AST -- 19  GLUCOSE 136* --   Lab Results  Component Value Date   CHOL 238 01/02/2011   HDL 56.0 01/02/2011   LDLCALC 163 01/02/2011   BNP (last 3 results)  Basename 11/19/12 1655  PROBNP 7488.0*    Diagnostic Studies/Procedures   Dg Chest Port 1 View  12/18/2012  *RADIOLOGY REPORT*  Clinical Data: Postop dialysis catheter placement.   PORTABLE CHEST - 1 VIEW  Comparison: 12/16/2012  Findings: Interval removal of left dialysis catheter and placement of right jugular dialysis catheter.  Tips are in the right atrium. Right central line is unchanged.  Cardiomegaly with low lung volumes.  No confluent airspace opacities or effusions.  No pneumothorax.  IMPRESSION: Right dialysis catheter placement with tip in the right atrium.  No pneumothorax.   Original Report Authenticated By: Charlett Nose, M.D.    Dg Fluoro Guide Cv Line-no Report  12/18/2012  CLINICAL DATA: HD cath   FLOURO GUIDE CV LINE  Fluoroscopy was utilized by the requesting physician.  No radiographic  interpretation.      Discharge Medications     Medication List     As of 12/19/2012 11:57 AM    STOP taking these medications         cloNIDine 0.3 MG tablet   Commonly known as: CATAPRES      diltiazem 360 MG 24 hr capsule   Commonly known as: TIAZAC      furosemide 40 MG tablet   Commonly known as: LASIX      metoprolol tartrate 25 MG tablet   Commonly known as: LOPRESSOR      multivitamin tablet      potassium chloride SA 20 MEQ tablet   Commonly known as: K-DUR,KLOR-CON      TAKE these medications         acetaminophen 325 MG tablet   Commonly known as: TYLENOL   Take 2 tablets (650 mg total) by mouth every 4 (four) hours as needed.      ALPRAZolam 0.25 MG tablet   Commonly known as: XANAX   Take 1 tablet (0.25 mg total) by mouth 2 (two) times daily as needed for anxiety.      antiseptic oral rinse Liqd   15 mLs by Mouth Rinse route 2 (two) times daily.      calcium carbonate (dosed in mg elemental calcium) 1250 MG/5ML   Take 5 mLs (500 mg of elemental calcium total) by mouth every 6 (six) hours as needed.      camphor-menthol lotion   Commonly known as: SARNA   Apply 1 application topically every 8 (eight) hours as needed for itching.      Casanthranol-Docusate Sodium 30-100 MG Caps   Take 1 capsule by mouth daily.      cloNIDine  0.1 mg/24hr patch   Commonly known as: CATAPRES - Dosed in mg/24 hr   Place 1 patch (0.1 mg total) onto the skin once a week.      darbepoetin 150 MCG/0.3ML Soln   Commonly known as: ARANESP   Inject 0.3 mLs (150 mcg total) into the vein every Tuesday with hemodialysis.      dextrose 5 % SOLN 50 mL with cefTRIAXone 1 G SOLR 1 g   Inject 1 g into the vein daily.      diltiazem 120 MG  24 hr capsule   Commonly known as: CARDIZEM CD   Take 1 capsule (120 mg total) by mouth at bedtime.      docusate sodium 283 MG enema   Commonly known as: ENEMEEZ   Place 1 enema (283 mg total) rectally as needed.      doxercalciferol 4 MCG/2ML injection   Commonly known as: HECTOROL   Inject 2 mLs (4 mcg total) into the vein Every Tuesday,Thursday,and Saturday with dialysis.      feeding supplement (NEPRO CARB STEADY) Liqd   Take 237 mLs by mouth 2 (two) times daily between meals.      feeding supplement (NEPRO CARB STEADY) Liqd   Take 237 mLs by mouth as needed (missed meal during dialysis.).      feeding supplement Liqd   Take 30 mLs by mouth 2 (two) times daily.      hydrALAZINE 20 MG/ML injection   Commonly known as: APRESOLINE   Inject 0.5 mLs (10 mg total) into the vein every 2 (two) hours as needed (administer for SBP>160).      hydrOXYzine 25 MG tablet   Commonly known as: ATARAX/VISTARIL   Take 1 tablet (25 mg total) by mouth every 8 (eight) hours as needed for itching.      lidocaine 1 % Soln injection   Commonly known as: XYLOCAINE   Inject 5 mLs into the skin as needed (topical anesthesia for hemodialysis ifGEBAUERS is ineffective.).      lidocaine-prilocaine cream   Commonly known as: EMLA   Apply 1 application topically as needed (topical anesthesia for hemodialysis if Gebauers and Lidocaine injection are ineffective.).      multivitamin Tabs tablet   Take 1 tablet by mouth at bedtime.      ondansetron 4 MG tablet   Commonly known as: ZOFRAN   Take 1 tablet (4 mg total)  by mouth every 6 (six) hours as needed for nausea.      pentafluoroprop-tetrafluoroeth Aero   Commonly known as: GEBAUERS   Apply 1 application topically as needed (topical anesthesia for hemodialysis).      sodium chloride 0.9 % SOLN 100 mL with ferric gluconate 12.5 MG/ML SOLN 125 mg   Inject 125 mg into the vein every Tuesday with hemodialysis.      sorbitol 70 % Soln   Take 30 mLs by mouth as needed.      traMADol 50 MG tablet   Commonly known as: ULTRAM   Take 1 tablet (50 mg total) by mouth every 12 (twelve) hours.      warfarin 4 MG tablet   Commonly known as: COUMADIN   Take 1 tablet (4 mg total) by mouth one time only at 6 PM.      warfarin 2.5 MG tablet   Commonly known as: COUMADIN   Take 2.5 mg by mouth daily. Patient takes 1 tablet (2.5 mg) every day except for Monday, Wednesday and Fridays she takes 1.5 tablets (3.75 mg)      zolpidem 5 MG tablet   Commonly known as: AMBIEN   Take 1 tablet (5 mg total) by mouth at bedtime as needed for sleep (Insomnia).          Disposition   The patient will be transferred to [facility] for continued care. Discharge Orders    Future Orders Please Complete By Expires   Diet - low sodium heart healthy      Place and maintain sequential compression device      Comments:  To be placed and continued while at [facility] until INR therapeutic for DVT prophylaxis.   Increase activity slowly            Duration of Discharge Encounter: Greater than 35 minutes   Signed, Tereso Newcomer  12/19/2012, 11:57 AM

## 2012-12-16 NOTE — Progress Notes (Signed)
Subjective: Interval History: has complaints arthritis bothers in legs.  Objective: Vital signs in last 24 hours: Temp:  [97.1 F (36.2 C)-98.7 F (37.1 C)] 97.5 F (36.4 C) (12/26 1147) Pulse Rate:  [92-105] 95  (12/26 1128) Resp:  [2-25] 18  (12/26 1147) BP: (124-168)/(74-85) 148/83 mmHg (12/26 1147) SpO2:  [88 %-100 %] 100 % (12/26 1147) FiO2 (%):  [28 %] 28 % (12/26 1040) Weight:  [92.2 kg (203 lb 4.2 oz)] 92.2 kg (203 lb 4.2 oz) (12/25 2051) Weight change: 0.6 kg (1 lb 5.2 oz)  Intake/Output from previous day: 12/25 0701 - 12/26 0700 In: 214 [P.O.:120; I.V.:44; IV Piggyback:50] Out: -  Intake/Output this shift: Total I/O In: 320 [P.O.:120; I.V.:200] Out: 60 [Blood:60]  General appearance: alert and cooperative Neck: LIJ cath Resp: diminished breath sounds bilaterally, rhonchi bilaterally and wheezes bibasilar Cardio: irregularly irregular rhythm and systolic murmur: holosystolic 2/6, blowing at apex GI: pos bs,liver down 6 cm, soft Extremities: avf LFA, deformities in hands. 1+ edema  Lab Results:  Basename 12/16/12 0724 12/15/12 0500  WBC 10.1 10.3  HGB 9.0* 8.5*  HCT 30.2* 29.3*  PLT 85* 69*   BMET:  Basename 12/15/12 0500 12/14/12 0500  NA 140 139  K 3.8 3.9  CL 101 102  CO2 31 28  GLUCOSE 94 87  BUN 27* 32*  CREATININE 2.12* 2.55*  CALCIUM 9.0 9.3   No results found for this basename: PTH:2 in the last 72 hours Iron Studies: No results found for this basename: IRON,TIBC,TRANSFERRIN,FERRITIN in the last 72 hours  Studies/Results: Dg Chest Port 1 View  12/16/2012  *RADIOLOGY REPORT*  Clinical Data: Status post placement of a left diatek catheter.  PORTABLE CHEST - 1 VIEW  Comparison: One-view chest 12/07/2012.  Findings: A left IJ dialysis catheter has been placed.  There is a kink at the skin surface.  The distal limb is at the cavoatrial junction.  The proximal limb is in the mid SVC.  Right subclavian line is stable.  Heart is enlarged.  Mild  interstitial edema is stable.  IMPRESSION:  1.  Status post placement or replacement of a left IJ dialysis catheter without radiographic evidence for pneumothorax or other, complication. 2.  The catheter terminates somewhat high, with the distal and at the cavoatrial junction. 3.  Mild generalized interstitial edema.   Original Report Authenticated By: Marin Roberts, M.D.    Dg Fluoro Guide Cv Line-no Report  12/16/2012  CLINICAL DATA: diatek   FLOURO GUIDE CV LINE  Fluoroscopy was utilized by the requesting physician.  No radiographic  interpretation.      I have reviewed the patient's current medications.  Assessment/Plan: 1 CRF for HD in am. Access placed.  Vol xs. Solute not an issue. 2 Anemia epo/fe 3 HPTH vit D 4 CHF stable 5 Afib rate controlled 6 COPD 7 Arthritis tx NSAIDS P HD epo,fe rate control    LOS: 27 days   Gaddiel Cullens L 12/16/2012,6:08 PM

## 2012-12-17 LAB — APTT
aPTT: 91 seconds — ABNORMAL HIGH (ref 24–37)
aPTT: 115 seconds — ABNORMAL HIGH (ref 24–37)

## 2012-12-17 LAB — CBC
HCT: 26.7 % — ABNORMAL LOW (ref 36.0–46.0)
MCH: 27.7 pg (ref 26.0–34.0)
MCV: 90.2 fL (ref 78.0–100.0)
Platelets: 99 10*3/uL — ABNORMAL LOW (ref 150–400)
RBC: 2.96 MIL/uL — ABNORMAL LOW (ref 3.87–5.11)

## 2012-12-17 LAB — COMPREHENSIVE METABOLIC PANEL
Alkaline Phosphatase: 88 U/L (ref 39–117)
BUN: 65 mg/dL — ABNORMAL HIGH (ref 6–23)
CO2: 27 mEq/L (ref 19–32)
Chloride: 98 mEq/L (ref 96–112)
GFR calc Af Amer: 15 mL/min — ABNORMAL LOW (ref >90)
GFR calc non Af Amer: 13 mL/min — ABNORMAL LOW (ref >90)
Glucose, Bld: 96 mg/dL (ref 70–99)
Potassium: 4 mEq/L (ref 3.5–5.1)
Total Bilirubin: 0.5 mg/dL (ref 0.3–1.2)

## 2012-12-17 LAB — RENAL FUNCTION PANEL
BUN: 64 mg/dL — ABNORMAL HIGH (ref 6–23)
CO2: 26 mEq/L (ref 19–32)
Calcium: 9.2 mg/dL (ref 8.4–10.5)
Creatinine, Ser: 3.12 mg/dL — ABNORMAL HIGH (ref 0.50–1.10)
GFR calc non Af Amer: 13 mL/min — ABNORMAL LOW (ref >90)

## 2012-12-17 MED ORDER — WARFARIN SODIUM 4 MG PO TABS
4.0000 mg | ORAL_TABLET | Freq: Once | ORAL | Status: AC
  Administered 2012-12-17: 4 mg via ORAL
  Filled 2012-12-17: qty 1

## 2012-12-17 MED ORDER — WARFARIN - PHARMACIST DOSING INPATIENT
Freq: Every day | Status: DC
  Administered 2012-12-18: 18:00:00

## 2012-12-17 MED ORDER — DOXERCALCIFEROL 4 MCG/2ML IV SOLN
INTRAVENOUS | Status: AC
  Filled 2012-12-17: qty 2

## 2012-12-17 NOTE — Progress Notes (Addendum)
ANTICOAGULATION CONSULT NOTE - Follow Up Consult  Pharmacy Consult for Argagriban Indication: possible HIT; atrial fibrillation  No Known Allergies  Patient Measurements: Height: 5\' 2"  (157.5 cm) Weight: 202 lb 13.2 oz (92 kg) IBW/kg (Calculated) : 50.1    Vital Signs: Temp: 97.5 F (36.4 C) (12/27 0641) Temp src: Oral (12/27 0641) BP: 159/84 mmHg (12/27 0700) Pulse Rate: 106  (12/27 0700)  Labs:  Basename 12/17/12 0700 12/17/12 0455 12/16/12 2206 12/16/12 2034 12/16/12 2033 12/16/12 0724 12/15/12 0500  HGB -- 8.2* -- -- 8.1* -- --  HCT -- 26.7* -- -- 27.3* 30.2* --  PLT -- 99* -- -- 76* 85* --  APTT -- 91* 80* 76* -- -- --  LABPROT -- 29.1* -- -- 22.9* -- 15.8*  INR -- 2.94* -- -- 2.13* -- 1.29  HEPARINUNFRC -- -- -- -- -- -- --  CREATININE 3.20* 3.12* -- -- 3.00* -- --  CKTOTAL -- -- -- -- -- -- --  CKMB -- -- -- -- -- -- --  TROPONINI -- -- -- -- -- -- --    Estimated Creatinine Clearance: 15.1 ml/min (by C-G formula based on Cr of 3.2).  Assessment: Patient is a 78 y.o F on Argatroban for possible HIT and Afib. HIT panel results still not back yet. APTT is slightly above goal at 0.91 this morning. No bleeding noted. Please note INR of 2.94 is falsely elevated d/t argatroban.  Goal of Therapy:  aPTT 50-90 seconds Monitor platelets by anticoagulation protocol: Yes   Plan:  1) reduce argatroban to 0.32 mcg/kg/min 2) check 2 hr aPTT   Dshaun Reppucci P 12/17/2012,8:14 AM  ______________________ 12/17/12 @1242  Adden:  APTT level now back supratherapeutic at 115 secs.  New Argatroban bottle was sent to dialysis around ~9AM.  By the time patient returned to unit 6700 around 1030 AM the bottle was out.  Rate was supposed to be running at 1.98mL/hr (0.31mcg/kg/min) from a 50mL vial.  I looked in room and Alaris pump had 18 showing as the rate.  APTT was probably due to error with infusion rate.  No bleeding noted. Will resume argatroban at previous rate of 1.12ml/hr or 0.32  mcg/kg/min and recheck aPTT in 2 hours.

## 2012-12-17 NOTE — Op Note (Signed)
[facility]  Patient name: Desiree Pierce MRN: [id number] DOB: 06-Oct-1931 Sex: female  11/19/2012 - 12/16/2012 Pre-operative Diagnosis: End-stage renal disease Post-operative diagnosis:  Same Surgeon:  Jorge Ny Assistants:  Doreatha Massed Procedure:   #1: Left radiocephalic fistula. #2: Left internal jugular diatek catheter Anesthesia:  MAC Blood Loss:  See anesthesia record Specimens:  None  Findings:  The left cephalic vein easily accepted a #4 dilator. The diet tech catheter was difficult to place 22 the fact that it was through a previously placed temporary catheter. Due to the patient's cardiomegaly it was difficult to have the catheter tip down into the cavoatrial junction. It had a tendency to want to slip cephalad. There was a slight cheek at the end of the procedure, however I could not get it seemed to resolve without compromising the placement of the catheter  Indications:  Patient comes in today for permanent access. She has been using a temporary catheter in the left neck. She is being worked up for heparin-induced thrombocytopenia. Her platelet count increased this morning  Procedure:  The patient was identified in the holding area and taken to [facility] OR ROOM 12  The patient was then placed supine on the table. MAC anesthesia was administered.  The patient was prepped and draped in the usual sterile fashion.  A time out was called and antibiotics were administered.  I initially began by placing the dietary catheter. Over a 035 Benson wire, I removed the previously placed temporary catheter. A peel-away sheath was inserted. I then over the wire placed a 28 cm tunneled catheter. The catheter did not want to go into the cavoatrial junction the rather flip up towards the right internal jugular vein. I therefore had to remove this catheter and place a new peel-away sheath. Through the peel-away sheath I used a Kumpe catheter to direct the wire distally.  I could not get the wire to go into the inferior vena cava. Over an Amplatz superstiff wire, a 28 cm tunnel catheter was placed. The peel-away sheath was removed. A skin exit site was selected. A #11 blade was used to make a skin incision.  A tunnel was then created and dilated with the dilator. The catheter was pulled through the tunnel and the cuff was situated at the skin exit site. Catheter was then connected to both ports which flushed and aspirated without difficulty. On final fluoroscopic imaging there did appear to be a cheek within the catheter. I tried to manipulate the catheter however I felt that extensive manipulation was going to cause the tip to flipped out into the innominate vein or possibly into the right subclavian vein. Since the catheter flushed and aspirated easily I elected to leave this alone. The catheter was sewn into place with 3-0 nylon. Dermabond placed on the neck incision.  Attention was then turned towards the left radiocephalic fistula. After the left arm was prepped and draped sterilely, the cephalic vein was evaluated with ultrasound. It was easily compressible. She had a palpable left radial pulse. After 1% lidocaine was used to anesthetize the skin, a longitudinal incision was made at the wrist. The vein was exposed and mobilized throughout the length of the incision. It was then marked with an 8 pen for proper orientation. I then dissected out the radial artery. There was a good pulse within the artery. It did have mild to moderate calcification. No heparin was used for this procedure, as the patient was being worked up  for heparin-induced thrombocytopenia. The radial artery was occluded with vascular clamps. The vein was ligated distally. It dilated to a 4 dilator. It flushed easily with heparin saline. The vein was cut to the appropriate length and then spatulated. An 11 blade was used to make an arteriotomy which was extended longitudinally with Potts scissors. A running  anastomosis was created in a end  to side fashion. Prior to completion the appropriate flushing maneuvers were performed. The anastomosis was then completed. There was a good thrill within the fistula and a palpable radial pulse distal to the anastomosis. The wound was then irrigated. The incision was closed with 2 layers of 3-0 Vicryl.   Disposition:  To PACU in stable condition.   Desiree Pierce, M.D. [facility] [place] Office: [phone] Pager:  [phone]

## 2012-12-17 NOTE — Progress Notes (Signed)
Plan discharge to [facility] on Saturday 12/18/2012.  [facility] will contact nursing unit 6700 with transfer information.

## 2012-12-17 NOTE — Clinical Social Work Note (Addendum)
Patient determined appropriate for LTAC and [facility] has an available bed. Patient will discharge to [facility] Saturday, 12/28. CSW signing off.  Genelle Bal, MSW, LCSW [phone]

## 2012-12-17 NOTE — Progress Notes (Signed)
ANTICOAGULATION CONSULT NOTE - Follow Up Consult  Pharmacy Consult for Coumadin Indication: atrial fibrillation  No Known Allergies  Patient Measurements: Height: 5\' 2"  (157.5 cm) Weight: 194 lb 14.2 oz (88.4 kg) IBW/kg (Calculated) : 50.1   Vital Signs: Temp: 98.2 F (36.8 C) (12/27 0935) Temp src: Oral (12/27 0935) BP: 155/80 mmHg (12/27 0935) Pulse Rate: 110  (12/27 0910)  Labs:  Basename 12/17/12 1002 12/17/12 0700 12/17/12 0455 12/16/12 2206 12/16/12 2033 12/16/12 0724 12/15/12 0500  HGB -- -- 8.2* -- 8.1* -- --  HCT -- -- 26.7* -- 27.3* 30.2* --  PLT -- -- 99* -- 76* 85* --  APTT 115* -- 91* 80* -- -- --  LABPROT -- -- 29.1* -- 22.9* -- 15.8*  INR -- -- 2.94* -- 2.13* -- 1.29  HEPARINUNFRC -- -- -- -- -- -- --  CREATININE -- 3.20* 3.12* -- 3.00* -- --  CKTOTAL -- -- -- -- -- -- --  CKMB -- -- -- -- -- -- --  TROPONINI -- -- -- -- -- -- --    Estimated Creatinine Clearance: 14.7 ml/min (by C-G formula based on Cr of 3.2).  Assessment: 81yof with afib known to pharmacy from argatroban dosing (r/o HIT - HIT panel pending) now to stop argatroban and restart coumadin. Dr. Gala Romney aware of low platelet count. INR is 2.94 (lab measurement falsely elevated due to agratroban, however, vitamin k dependent factor Xa activity is not affected). Expect effect of argatroban to be gone in 4-6 hours once turned off. INR in the morning will be a better reflection of patient's true INR.   Dose pta = 2.5mg  daily except 3.75mg  MWF.  Goal of Therapy:  INR 2-3 Monitor platelets by anticoagulation protocol: Yes   Plan:  1) Coumadin 4mg  po x 1 tonight 2) Daily INR   Fredrik Rigger 12/17/2012,4:13 PM

## 2012-12-17 NOTE — Progress Notes (Signed)
Stop argatropan. Add scds and coumadin per pharmacy.   Per Dr Gala Romney ok to transfer to [facility] tomorrow.    Ferrel Simington 4:00 PM

## 2012-12-17 NOTE — Procedures (Signed)
Patient was seen on dialysis and the procedure was supervised. BFR 150 Via new LIJ PC BP is stable.  Patient appears to be tolerating treatment well but with poorly functioning PC.  Will rinse back and discuss with VVS as she will likely require repositioning of current access.

## 2012-12-17 NOTE — Progress Notes (Signed)
Advanced Heart Failure Rounding Note   Subjective:    Ms. Vajda is a 78 yo female with PMHx s/f chronic diastolic CHF, congenital solitary kidney, CKD (stage IV, baseline Cr 1.5-2.4), COPD + OSA + morbid obesity ->secondary pulmonary HTN, permanent atrial fibrillation, OSA, GERD.Admitted with recurrent diastolic HF and RHF. Admit weight 232 pounds.   Since admission has had worsening renal function and not HD dependent. Doing well with HD. Weight down. No sob, orthopnea. BP high last night and got a dose of hydralazine.  Heparin switched to argatroban due to possible HIT. HIT panel pending. Underwent AVF and perm cath placement yesterday (12/26). Apparently perm-cath not working properly. Due for replacement tomorrow. Platelets now 99k. Denies SOB or orthopnea. Remains weak.    Objective:    Vital Signs:   Temp:  [97.5 F (36.4 C)-99.2 F (37.3 C)] 98.2 F (36.8 C) (12/27 0935) Pulse Rate:  [104-113] 110  (12/27 0910) Resp:  [16-21] 18  (12/27 0935) BP: (139-161)/(71-97) 155/80 mmHg (12/27 0935) SpO2:  [92 %-99 %] 95 % (12/27 0935) Weight:  [88.4 kg (194 lb 14.2 oz)-96.3 kg (212 lb 4.9 oz)] 88.4 kg (194 lb 14.2 oz) (12/27 0910) Last BM Date: 12/17/12  Weight change: Filed Weights   12/16/12 2200 12/17/12 0641 12/17/12 0910  Weight: 96.3 kg (212 lb 4.9 oz) 92 kg (202 lb 13.2 oz) 88.4 kg (194 lb 14.2 oz)    Intake/Output:   Intake/Output Summary (Last 24 hours) at 12/17/12 1230 Last data filed at 12/17/12 0910  Gross per 24 hour  Intake 875.89 ml  Output   1710 ml  Net -834.11 ml     Physical Exam:  General:  No acute distress HEENT: normal Neck: supple. JVP  jaw. Carotids 2+ bilat; no bruits. No lymphadenopathy or thryomegaly appreciated. LIJ catheter Cor: PMI nondisplaced. Irregular rate & rhythm. No rubs, gallops or murmurs. Lungs: diminished. On 2 liters [place] Abdomen: soft, nontender, nondistended. No hepatosplenomegaly. No bruits or masses. Sluggish bowel  sounds. Extremities: no cyanosis, clubbing, rash, no edema. RA changes Neuro: alert & orientedx3, cranial nerves grossly intact. moves all 4 extremities w/o difficulty. Affect pleasant. Intermittently confused  Telemetry: A fib 70-80s  Labs: Basic Metabolic Panel:  Lab 12/17/12 4098 12/17/12 0455 12/16/12 2033 12/15/12 0500 12/14/12 0500 12/13/12 0400 12/12/12 0345 12/11/12 0441  NA 137 139 138 140 139 -- -- --  K 4.0 3.9 3.9 3.8 3.9 -- -- --  CL 98 100 99 101 102 -- -- --  CO2 27 26 24 31 28  -- -- --  GLUCOSE 96 95 170* 94 87 -- -- --  BUN 65* 64* 54* 27* 32* -- -- --  CREATININE 3.20* 3.12* 3.00* 2.12* 2.55* -- -- --  CALCIUM 9.5 9.2 9.5 -- -- -- -- --  MG -- -- -- -- -- -- 2.4 2.5  PHOS -- 4.8* 5.0* 3.0 3.3 2.6 -- --    Liver Function Tests:  Lab 12/17/12 0700 12/17/12 0455 12/16/12 2033 12/15/12 0500 12/14/12 0500 12/12/12 0820  AST 19 -- -- -- -- --  ALT 13 -- -- -- -- 21  ALKPHOS 88 -- -- -- -- --  BILITOT 0.5 -- -- -- -- --  PROT 7.6 -- -- -- -- --  ALBUMIN 3.0* 2.8* 2.9* 3.0* 3.0* --   No results found for this basename: LIPASE:5,AMYLASE:5 in the last 168 hours No results found for this basename: AMMONIA:3 in the last 168 hours  CBC:  Lab 12/17/12 0455 12/16/12 2033  12/16/12 0724 12/15/12 0500 12/14/12 0435  WBC 11.1* 8.7 10.1 10.3 8.0  NEUTROABS -- -- -- -- --  HGB 8.2* 8.1* 9.0* 8.5* 8.5*  HCT 26.7* 27.3* 30.2* 29.3* 29.2*  MCV 90.2 91.0 91.5 93.0 92.7  PLT 99* 76* 85* 69* 98*    Cardiac Enzymes: No results found for this basename: CKTOTAL:5,CKMB:5,CKMBINDEX:5,TROPONINI:5 in the last 168 hours  BNP: BNP (last 3 results)  Basename 11/19/12 1655  PROBNP 7488.0*     Medications:     Scheduled Medications:    . antiseptic oral rinse  15 mL Mouth Rinse BID  . cefTRIAXone (ROCEPHIN)  IV  1 g Intravenous Q24H  . darbepoetin (ARANESP) injection - DIALYSIS  150 mcg Intravenous Q Tue-HD  . diltiazem  120 mg Oral QHS  . doxercalciferol  4 mcg  Intravenous Q T,Th,Sa-HD  . feeding supplement (NEPRO CARB STEADY)  237 mL Oral BID BM  . feeding supplement  30 mL Oral BID  . ferric gluconate (FERRLECIT/NULECIT) IV  125 mg Intravenous Q Tue-HD  . multivitamin  1 tablet Oral QHS  . senna-docusate  1 tablet Oral Daily  . sodium chloride  10 mL Intravenous Q12H  . traMADol  50 mg Oral Q12H    Infusions:    . argatroban 0.32 mcg/kg/min (12/17/12 0859)    PRN Medications: sodium chloride, sodium chloride, acetaminophen, acetaminophen, ALPRAZolam, calcium carbonate (dosed in mg elemental calcium), camphor-menthol, docusate sodium, feeding supplement (NEPRO CARB STEADY), hydrALAZINE, HYDROmorphone (DILAUDID) injection, hydrOXYzine, lidocaine, lidocaine-prilocaine, ondansetron (ZOFRAN) IV, ondansetron, pentafluoroprop-tetrafluoroeth, sorbitol, zolpidem   Assessment:   1. Acute on chronic diastolic CHF  2. Permanent atrial fibrillation  3. Morbid obesity  4. Acute/Chronic renal failure, stage 4       --solitary kidney  5. OSA  6. Physical debility/deconditioning - wheelchair bound  7. Noncompliance  8. Hypertension, poorly controlled 9. UTI, cipro started 12/10 10. Acute respiratory failure 11. Anemia - 12. Acute delirium 13. Arthritis - Lt knee 14. Thrombocytopenia  Plan/Discussion:    She is now dialysis dependent. Tolerated HD well. AVF placed. Awaiting perm-cath revision tomorrow.  Heparin off now on argatroban for possible HIT. 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 Resume coumadin per pharmacy.  Main issue will be disposition. I discussed this with Dr. Arrie Aran if able to sit in [facility] for HD can likely go back to [facility] but if do debilitated will need short stay at [facility] to continue treatment prior to going back to SNF. Will d/w Case Manager. Will also need outpatient HD arranged.   Length of Stay: 75 Adon Gehlhausen,MD 12:30 PM

## 2012-12-17 NOTE — Progress Notes (Signed)
[facility] [place] Postoperative Visit hemodialysis access   Date of Surgery:  12/16/12 Surgeon: Myra Gianotti   HPI:  This is a 78 y.o. female who returns today s/p left AVF.  She is resting comfortably on HD.    PHYSICAL EXAMINATION:  Filed Vitals:   12/17/12 0700  BP: 159/84  Pulse: 106  Temp:   Resp: 16     Incision is c/d/i  sensation in digits is intact;  There is  Thrill;  there is bruit. The graft/fistula is palpable    ASSESSMENT/PLAN:  Desiree Pierce is a 78 y.o. year old female who is s/p left AVF.  -pt is doing well and AVF is patent. -f/u with Dr. Myra Gianotti in 6 weeks. -will sign off.  Call if needed.   Doreatha Massed, PA-C [facility] [phone]

## 2012-12-17 NOTE — Progress Notes (Signed)
Patient ID: Desiree Pierce, female   DOB: 08-May-1931, 78 y.o.   MRN: [id number]  [facility] Progress Note    Subjective:   No new complaints.  Seen on HD   Objective:   BP 158/85  Pulse 107  Temp 97.5 F (36.4 C) (Oral)  Resp 19  Ht 5\' 2"  (1.575 m)  Wt 92 kg (202 lb 13.2 oz)  BMI 37.10 kg/m2  SpO2 92%  Physical Exam: Gen:WD WN elderly AAF in NAD CVS:no rub Resp:cta WUJ:[id number] Ext:L wrist AVF +T/B  Labs: BMET  Lab 12/17/12 0700 12/17/12 0455 12/16/12 2033 12/15/12 0500 12/14/12 0500 12/13/12 0400 12/12/12 0345 12/11/12 0441  NA 137 139 138 140 139 135 137 --  K 4.0 3.9 3.9 3.8 3.9 4.1 4.4 --  CL 98 100 99 101 102 97 101 --  CO2 27 26 24 31 28 29 25  --  GLUCOSE 96 95 170* 94 87 80 75 --  BUN 65* 64* 54* 27* 32* 18 28* --  CREATININE 3.20* 3.12* 3.00* 2.12* 2.55* 1.85* 2.33* --  ALBUMIN 3.0* 2.8* 2.9* 3.0* 3.0* 3.0* 3.1* --  CALCIUM 9.5 9.2 9.5 9.0 9.3 9.1 9.5 --  PHOS -- 4.8* 5.0* 3.0 3.3 2.6 3.3 2.1*   CBC  Lab 12/17/12 0455 12/16/12 2033 12/16/12 0724 12/15/12 0500  WBC 11.1* 8.7 10.1 10.3  NEUTROABS -- -- -- --  HGB 8.2* 8.1* 9.0* 8.5*  HCT 26.7* 27.3* 30.2* 29.3*  MCV 90.2 91.0 91.5 93.0  PLT 99* 76* 85* 69*    @IMGRELPRIORS @ Medications:      . antiseptic oral rinse  15 mL Mouth Rinse BID  . cefTRIAXone (ROCEPHIN)  IV  1 g Intravenous Q24H  . darbepoetin (ARANESP) injection - DIALYSIS  150 mcg Intravenous Q Tue-HD  . diltiazem  120 mg Oral QHS  . doxercalciferol  4 mcg Intravenous Q T,Th,Sa-HD  . feeding supplement (NEPRO CARB STEADY)  237 mL Oral BID BM  . feeding supplement  30 mL Oral BID  . ferric gluconate (FERRLECIT/NULECIT) IV  125 mg Intravenous Q Tue-HD  . multivitamin  1 tablet Oral QHS  . senna-docusate  1 tablet Oral Daily  . sodium chloride  10 mL Intravenous Q12H  . traMADol  50 mg Oral Q12H     Assessment/ Plan:   1. CRF seen on HD today. Access placed yesterday but does not appear to be functioning appropriately  and may require repositioning.  Will d/w VVS. Vol xs. Solute not an issue.  2. Anemia epo/fe  3. HPTH vit D  4. CHF stable  5. Afib rate controlled  6. COPD  7. Arthritis tx NSAIDS  8. Vascular access- s/p new LIJ PC as above and Left AVF +T/B 9. Thrombocytopenia- ?HIT. No heparin. 10. Dispo-  Per primary svc  Teion Ballin A 12/17/2012, 9:10 AM

## 2012-12-18 ENCOUNTER — Inpatient Hospital Stay (HOSPITAL_COMMUNITY): Payer: Medicare Other

## 2012-12-18 ENCOUNTER — Encounter (HOSPITAL_COMMUNITY)
Admission: EM | Disposition: A | Discharge status: Nursing Home (Includes ICF) | Payer: Self-pay | Source: Home / Self Care | Attending: Internal Medicine

## 2012-12-18 ENCOUNTER — Encounter (HOSPITAL_COMMUNITY): Payer: Self-pay | Admitting: Anesthesiology

## 2012-12-18 ENCOUNTER — Inpatient Hospital Stay (HOSPITAL_COMMUNITY): Payer: Medicare Other | Admitting: Anesthesiology

## 2012-12-18 HISTORY — PX: INSERTION OF DIALYSIS CATHETER: SHX1324

## 2012-12-18 LAB — RENAL FUNCTION PANEL
BUN: 70 mg/dL — ABNORMAL HIGH (ref 6–23)
CO2: 25 mEq/L (ref 19–32)
Calcium: 9.1 mg/dL (ref 8.4–10.5)
Chloride: 101 mEq/L (ref 96–112)
Creatinine, Ser: 3.13 mg/dL — ABNORMAL HIGH (ref 0.50–1.10)
GFR calc non Af Amer: 13 mL/min — ABNORMAL LOW (ref >90)
Glucose, Bld: 85 mg/dL (ref 70–99)

## 2012-12-18 LAB — CBC
HCT: 28.3 % — ABNORMAL LOW (ref 36.0–46.0)
Hemoglobin: 8.3 g/dL — ABNORMAL LOW (ref 12.0–15.0)
MCH: 26.9 pg (ref 26.0–34.0)
MCV: 91.6 fL (ref 78.0–100.0)
RBC: 3.09 MIL/uL — ABNORMAL LOW (ref 3.87–5.11)
WBC: 9.8 10*3/uL (ref 4.0–10.5)

## 2012-12-18 LAB — POCT I-STAT 4, (NA,K, GLUC, HGB,HCT)
Glucose, Bld: 86 mg/dL (ref 70–99)
HCT: 27 % — ABNORMAL LOW (ref 36.0–46.0)
Potassium: 3.8 mEq/L (ref 3.5–5.1)

## 2012-12-18 LAB — PROTIME-INR: INR: 1.77 — ABNORMAL HIGH (ref 0.00–1.49)

## 2012-12-18 SURGERY — INSERTION OF DIALYSIS CATHETER
Anesthesia: General | Site: Chest | Laterality: Right | Wound class: Clean

## 2012-12-18 MED ORDER — TRAMADOL HCL 50 MG PO TABS: 50.0000 mg | ORAL_TABLET | Freq: Two times a day (BID) | ORAL | Status: DC

## 2012-12-18 MED ORDER — FENTANYL CITRATE 0.05 MG/ML IJ SOLN: 25.0000 ug | INTRAMUSCULAR | Status: DC | PRN

## 2012-12-18 MED ORDER — DOCUSATE SODIUM 283 MG RE ENEM: 1.0000 | ENEMA | RECTAL | Status: DC | PRN

## 2012-12-18 MED ORDER — MIDAZOLAM HCL 2 MG/2ML IJ SOLN: 1.0000 mg | INTRAMUSCULAR | Status: DC | PRN

## 2012-12-18 MED ORDER — VANCOMYCIN HCL IN DEXTROSE 1-5 GM/200ML-% IV SOLN
1000.0000 mg | Freq: Once | INTRAVENOUS | Status: AC
  Administered 2012-12-18: 1000 mg via INTRAVENOUS
  Filled 2012-12-18: qty 200

## 2012-12-18 MED ORDER — DEXTROSE 5 % IV SOLN: 1.0000 g | INTRAVENOUS | Status: AC

## 2012-12-18 MED ORDER — ONDANSETRON HCL 4 MG PO TABS: 4.0000 mg | ORAL_TABLET | Freq: Four times a day (QID) | ORAL | Status: AC | PRN

## 2012-12-18 MED ORDER — CALCIUM CARBONATE 1250 MG/5ML PO SUSP: 500.0000 mg | Freq: Four times a day (QID) | ORAL | Status: DC | PRN

## 2012-12-18 MED ORDER — PRO-STAT SUGAR FREE PO LIQD: 30.0000 mL | Freq: Two times a day (BID) | ORAL | Status: DC

## 2012-12-18 MED ORDER — DILTIAZEM HCL ER COATED BEADS 120 MG PO CP24: 120.0000 mg | ORAL_CAPSULE | Freq: Every day | ORAL | Status: AC

## 2012-12-18 MED ORDER — LIDOCAINE HCL (CARDIAC) 20 MG/ML IV SOLN
INTRAVENOUS | Status: DC | PRN
  Administered 2012-12-18: 50 mg via INTRAVENOUS

## 2012-12-18 MED ORDER — NEPRO/CARBSTEADY PO LIQD: 237.0000 mL | Freq: Two times a day (BID) | ORAL | Status: DC

## 2012-12-18 MED ORDER — OXYCODONE HCL 5 MG PO TABS
5.0000 mg | ORAL_TABLET | Freq: Once | ORAL | Status: AC | PRN
  Administered 2012-12-18: 5 mg via ORAL

## 2012-12-18 MED ORDER — HYDRALAZINE HCL 20 MG/ML IJ SOLN: 10.0000 mg | INTRAMUSCULAR | Status: DC | PRN

## 2012-12-18 MED ORDER — OXYCODONE HCL 5 MG/5ML PO SOLN: 5.0000 mg | Freq: Once | ORAL | Status: AC | PRN

## 2012-12-18 MED ORDER — FENTANYL CITRATE 0.05 MG/ML IJ SOLN: 50.0000 ug | Freq: Once | INTRAMUSCULAR | Status: DC

## 2012-12-18 MED ORDER — VANCOMYCIN HCL IN DEXTROSE 1-5 GM/200ML-% IV SOLN
INTRAVENOUS | Status: AC
  Filled 2012-12-18: qty 200

## 2012-12-18 MED ORDER — WARFARIN SODIUM 4 MG PO TABS
4.0000 mg | ORAL_TABLET | Freq: Once | ORAL | Status: AC
  Administered 2012-12-18: 4 mg via ORAL
  Filled 2012-12-18: qty 1

## 2012-12-18 MED ORDER — PROMETHAZINE HCL 25 MG/ML IJ SOLN: 6.2500 mg | INTRAMUSCULAR | Status: DC | PRN

## 2012-12-18 MED ORDER — CAMPHOR-MENTHOL 0.5-0.5 % EX LOTN: 1.0000 "application " | TOPICAL_LOTION | Freq: Three times a day (TID) | CUTANEOUS | Status: DC | PRN

## 2012-12-18 MED ORDER — SODIUM CHLORIDE 0.9 % IR SOLN: Status: DC | PRN

## 2012-12-18 MED ORDER — ALPRAZOLAM 0.25 MG PO TABS: 0.2500 mg | ORAL_TABLET | Freq: Two times a day (BID) | ORAL | Status: DC | PRN

## 2012-12-18 MED ORDER — VANCOMYCIN HCL 1000 MG IV SOLR
1000.0000 mg | INTRAVENOUS | Status: DC | PRN
  Administered 2012-12-18: 1000 mg via INTRAVENOUS

## 2012-12-18 MED ORDER — BIOTENE DRY MOUTH MT LIQD: 15.0000 mL | Freq: Two times a day (BID) | OROMUCOSAL | Status: DC

## 2012-12-18 MED ORDER — ZOLPIDEM TARTRATE 5 MG PO TABS: 5.0000 mg | ORAL_TABLET | Freq: Every evening | ORAL | Status: DC | PRN

## 2012-12-18 MED ORDER — RENA-VITE PO TABS: 1.0000 | ORAL_TABLET | Freq: Every day | ORAL | Status: AC

## 2012-12-18 MED ORDER — PROPOFOL 10 MG/ML IV BOLUS
INTRAVENOUS | Status: DC | PRN
  Administered 2012-12-18: 120 mg via INTRAVENOUS

## 2012-12-18 MED ORDER — ACETAMINOPHEN 325 MG PO TABS: 650.0000 mg | ORAL_TABLET | ORAL | Status: DC | PRN

## 2012-12-18 MED ORDER — ONDANSETRON HCL 4 MG/2ML IJ SOLN
INTRAMUSCULAR | Status: DC | PRN
  Administered 2012-12-18: 4 mg via INTRAVENOUS

## 2012-12-18 MED ORDER — LIDOCAINE-EPINEPHRINE (PF) 1 %-1:200000 IJ SOLN
INTRAMUSCULAR | Status: AC
  Filled 2012-12-18: qty 10

## 2012-12-18 MED ORDER — VANCOMYCIN HCL 1000 MG IV SOLR: 1000.0000 mg | Freq: Two times a day (BID) | INTRAVENOUS | Status: DC

## 2012-12-18 MED ORDER — HYDROXYZINE HCL 25 MG PO TABS: 25.0000 mg | ORAL_TABLET | Freq: Three times a day (TID) | ORAL | Status: DC | PRN

## 2012-12-18 MED ORDER — PHENYLEPHRINE HCL 10 MG/ML IJ SOLN
INTRAMUSCULAR | Status: DC | PRN
  Administered 2012-12-18 (×3): 80 ug via INTRAVENOUS
  Administered 2012-12-18: 120 ug via INTRAVENOUS
  Administered 2012-12-18 (×2): 80 ug via INTRAVENOUS

## 2012-12-18 MED ORDER — FENTANYL CITRATE 0.05 MG/ML IJ SOLN
INTRAMUSCULAR | Status: DC | PRN
  Administered 2012-12-18: 50 ug via INTRAVENOUS

## 2012-12-18 MED ORDER — SORBITOL 70 % SOLN: 30.0000 mL | Status: DC | PRN

## 2012-12-18 MED ORDER — SODIUM CHLORIDE 0.9 % IJ SOLN
INTRAMUSCULAR | Status: DC | PRN
  Administered 2012-12-18: 10 mL

## 2012-12-18 SURGICAL SUPPLY — 44 items
BAG DECANTER FOR FLEXI CONT (MISCELLANEOUS) ×2 IMPLANT
CATH BEACON 5.038 65CM KMP-01 (CATHETERS) ×2 IMPLANT
CATH CANNON HEMO 15F 50CM (CATHETERS) IMPLANT
CATH CANNON HEMO 15FR 19 (HEMODIALYSIS SUPPLIES) ×2 IMPLANT
CATH CANNON HEMO 15FR 23CM (HEMODIALYSIS SUPPLIES) ×2 IMPLANT
CATH CANNON HEMO 15FR 31CM (HEMODIALYSIS SUPPLIES) IMPLANT
CATH CANNON HEMO 15FR 32CM (HEMODIALYSIS SUPPLIES) IMPLANT
CLOTH BEACON ORANGE TIMEOUT ST (SAFETY) ×2 IMPLANT
COVER PROBE W GEL 5X96 (DRAPES) ×2 IMPLANT
COVER SURGICAL LIGHT HANDLE (MISCELLANEOUS) ×2 IMPLANT
DERMABOND ADVANCED (GAUZE/BANDAGES/DRESSINGS) ×1
DERMABOND ADVANCED .7 DNX12 (GAUZE/BANDAGES/DRESSINGS) ×1 IMPLANT
DRAPE C-ARM 42X72 X-RAY (DRAPES) ×2 IMPLANT
DRAPE CHEST BREAST 15X10 FENES (DRAPES) ×2 IMPLANT
GAUZE SPONGE 2X2 8PLY STRL LF (GAUZE/BANDAGES/DRESSINGS) ×1 IMPLANT
GAUZE SPONGE 4X4 16PLY XRAY LF (GAUZE/BANDAGES/DRESSINGS) ×2 IMPLANT
GLOVE BIO SURGEON STRL SZ 6.5 (GLOVE) ×4 IMPLANT
GLOVE BIOGEL PI IND STRL 6.5 (GLOVE) ×2 IMPLANT
GLOVE BIOGEL PI IND STRL 7.5 (GLOVE) ×1 IMPLANT
GLOVE BIOGEL PI INDICATOR 6.5 (GLOVE) ×2
GLOVE BIOGEL PI INDICATOR 7.5 (GLOVE) ×1
GLOVE SURG SS PI 7.5 STRL IVOR (GLOVE) ×2 IMPLANT
GOWN PREVENTION PLUS XXLARGE (GOWN DISPOSABLE) ×2 IMPLANT
GOWN STRL NON-REIN LRG LVL3 (GOWN DISPOSABLE) ×4 IMPLANT
KIT BASIN OR (CUSTOM PROCEDURE TRAY) ×2 IMPLANT
KIT ROOM TURNOVER OR (KITS) ×2 IMPLANT
NEEDLE 18GX1X1/2 (RX/OR ONLY) (NEEDLE) ×2 IMPLANT
NEEDLE HYPO 25GX1X1/2 BEV (NEEDLE) ×2 IMPLANT
NS IRRIG 1000ML POUR BTL (IV SOLUTION) ×2 IMPLANT
PACK SURGICAL SETUP 50X90 (CUSTOM PROCEDURE TRAY) ×2 IMPLANT
PAD ARMBOARD 7.5X6 YLW CONV (MISCELLANEOUS) ×4 IMPLANT
SOAP 2 % CHG 4 OZ (WOUND CARE) ×2 IMPLANT
SPONGE GAUZE 2X2 STER 10/PKG (GAUZE/BANDAGES/DRESSINGS) ×1
SUT ETHILON 3 0 PS 1 (SUTURE) ×2 IMPLANT
SUT VICRYL 4-0 PS2 18IN ABS (SUTURE) ×2 IMPLANT
SYR 20CC LL (SYRINGE) ×4 IMPLANT
SYR 30ML LL (SYRINGE) IMPLANT
SYR 5ML LL (SYRINGE) ×2 IMPLANT
SYR CONTROL 10ML LL (SYRINGE) ×2 IMPLANT
SYRINGE 10CC LL (SYRINGE) ×2 IMPLANT
TOWEL OR 17X24 6PK STRL BLUE (TOWEL DISPOSABLE) ×2 IMPLANT
TOWEL OR 17X26 10 PK STRL BLUE (TOWEL DISPOSABLE) ×2 IMPLANT
WATER STERILE IRR 1000ML POUR (IV SOLUTION) ×2 IMPLANT
WIRE BENTSON .035X145CM (WIRE) ×2 IMPLANT

## 2012-12-18 NOTE — Anesthesia Preprocedure Evaluation (Signed)
Anesthesia Evaluation  Patient identified by MRN, date of birth, ID band Patient awake    Reviewed: Allergy & Precautions, H&P , NPO status , Patient's Chart, lab work & pertinent test results  Airway Mallampati: I TM Distance: >3 FB Neck ROM: full    Dental  (+) Edentulous Upper and Edentulous Lower   Pulmonary COPDformer smoker,          Cardiovascular hypertension, +CHF + dysrhythmias Atrial Fibrillation Rhythm:irregular Rate:Normal     Neuro/Psych    GI/Hepatic GERD-  ,  Endo/Other    Renal/GU ESRF and DialysisRenal disease     Musculoskeletal   Abdominal   Peds  Hematology   Anesthesia Other Findings   Reproductive/Obstetrics                           Anesthesia Physical Anesthesia Plan  ASA: III  Anesthesia Plan: General   Post-op Pain Management:    Induction: Intravenous  Airway Management Planned: LMA  Additional Equipment:   Intra-op Plan:   Post-operative Plan: Extubation in OR  Informed Consent: I have reviewed the patients History and Physical, chart, labs and discussed the procedure including the risks, benefits and alternatives for the proposed anesthesia with the patient or authorized representative who has indicated his/her understanding and acceptance.     Plan Discussed with: Surgeon and CRNA  Anesthesia Plan Comments:         Anesthesia Quick Evaluation

## 2012-12-18 NOTE — Progress Notes (Signed)
Advanced Heart Failure Rounding Note   Subjective:    Desiree Pierce is a 78 yo female with PMHx s/f chronic diastolic CHF, congenital solitary kidney, CKD (stage IV, baseline Cr 1.5-2.4), COPD + OSA + morbid obesity ->secondary pulmonary HTN, permanent atrial fibrillation, OSA, GERD.Admitted with recurrent diastolic HF and RHF. Admit weight 232 pounds.   Since admission has had worsening renal function and not HD dependent. Doing well with HD. Weight down. No sob, orthopnea. BP high last night and got a dose of hydralazine.  Heparin switched to argatroban due to possible HIT. HIT panel pending. Underwent AVF and perm cath placement (12/26). Had replacement of perm-cath this am.  Doing OK post-op.   Objective:    Vital Signs:   Temp:  [97.9 F (36.6 C)-99 F (37.2 C)] 97.9 F (36.6 C) (12/28 0553) Pulse Rate:  [100-110] 100  (12/28 0553) Resp:  [17-18] 18  (12/28 0553) BP: (134-160)/(63-85) 159/82 mmHg (12/28 0553) SpO2:  [85 %-100 %] 100 % (12/28 0553) Weight:  [88.4 kg (194 lb 14.2 oz)-89.1 kg (196 lb 6.9 oz)] 89.1 kg (196 lb 6.9 oz) (12/27 2101) Last BM Date: 12/17/12  Weight change: Filed Weights   12/17/12 0641 12/17/12 0910 12/17/12 2101  Weight: 92 kg (202 lb 13.2 oz) 88.4 kg (194 lb 14.2 oz) 89.1 kg (196 lb 6.9 oz)    Intake/Output:   Intake/Output Summary (Last 24 hours) at 12/18/12 0903 Last data filed at 12/18/12 0848  Gross per 24 hour  Intake    800 ml  Output   1715 ml  Net   -915 ml     Physical Exam:  General:  No acute distress HEENT: normal Neck: supple. JVP  jaw. Carotids 2+ bilat; no bruits. No lymphadenopathy or thryomegaly appreciated. RIJ catheter Cor: PMI nondisplaced. Irregular rate & rhythm. No rubs, gallops or murmurs. Lungs: diminished. On 2 liters [place] Abdomen: soft, nontender, nondistended. No hepatosplenomegaly. No bruits or masses. Sluggish bowel sounds. Extremities: no cyanosis, clubbing, rash, no edema. RA changes Neuro: alert &  orientedx3, cranial nerves grossly intact. moves all 4 extremities w/o difficulty. Affect pleasant. Intermittently confused  Telemetry: A fib 70-80s  Labs: Basic Metabolic Panel:  Lab 12/18/12 1610 12/17/12 0700 12/17/12 0455 12/16/12 2033 12/15/12 0500 12/14/12 0500 12/13/12 0400 12/12/12 0345  NA 142 137 139 138 140 -- -- --  K 3.8 4.0 3.9 3.9 3.8 -- -- --  CL -- 98 100 99 101 102 -- --  CO2 -- 27 26 24 31 28  -- --  GLUCOSE 86 96 95 170* 94 -- -- --  BUN -- 65* 64* 54* 27* 32* -- --  CREATININE -- 3.20* 3.12* 3.00* 2.12* 2.55* -- --  CALCIUM -- 9.5 9.2 9.5 -- -- -- --  MG -- -- -- -- -- -- -- 2.4  PHOS -- -- 4.8* 5.0* 3.0 3.3 2.6 --    Liver Function Tests:  Lab 12/17/12 0700 12/17/12 0455 12/16/12 2033 12/15/12 0500 12/14/12 0500 12/12/12 0820  AST 19 -- -- -- -- --  ALT 13 -- -- -- -- 21  ALKPHOS 88 -- -- -- -- --  BILITOT 0.5 -- -- -- -- --  PROT 7.6 -- -- -- -- --  ALBUMIN 3.0* 2.8* 2.9* 3.0* 3.0* --   No results found for this basename: LIPASE:5,AMYLASE:5 in the last 168 hours No results found for this basename: AMMONIA:3 in the last 168 hours  CBC:  Lab 12/18/12 0724 12/17/12 0455 12/16/12 2033 12/16/12 0724 12/15/12  0500 12/14/12 0435  WBC -- 11.1* 8.7 10.1 10.3 8.0  NEUTROABS -- -- -- -- -- --  HGB 9.2* 8.2* 8.1* 9.0* 8.5* --  HCT 27.0* 26.7* 27.3* 30.2* 29.3* --  MCV -- 90.2 91.0 91.5 93.0 92.7  PLT -- 99* 76* 85* 69* 98*    Cardiac Enzymes: No results found for this basename: CKTOTAL:5,CKMB:5,CKMBINDEX:5,TROPONINI:5 in the last 168 hours  BNP: BNP (last 3 results)  Basename 11/19/12 1655  PROBNP 7488.0*     Medications:     Scheduled Medications:    . [facility] HOLD] antiseptic oral rinse  15 mL Mouth Rinse BID  . [MAR HOLD] cefTRIAXone (ROCEPHIN)  IV  1 g Intravenous Q24H  . [MAR HOLD] darbepoetin (ARANESP) injection - DIALYSIS  150 mcg Intravenous Q Tue-HD  . [facility] HOLD] diltiazem  120 mg Oral QHS  . [facility] HOLD] doxercalciferol  4 mcg  Intravenous Q T,Th,Sa-HD  . [MAR HOLD] feeding supplement (NEPRO CARB STEADY)  237 mL Oral BID BM  . [facility] HOLD] feeding supplement  30 mL Oral BID  . fentaNYL  50-100 mcg Intravenous Once  . [facility] HOLD] ferric gluconate (FERRLECIT/NULECIT) IV  125 mg Intravenous Q Tue-HD  . [MAR HOLD] multivitamin  1 tablet Oral QHS  . [MAR HOLD] senna-docusate  1 tablet Oral Daily  . [MAR HOLD] sodium chloride  10 mL Intravenous Q12H  . [facility] HOLD] traMADol  50 mg Oral Q12H  . vancomycin (VANCOCIN) 1000 mg IVPB  1,000 mg Intravenous Q12H  . [facility] HOLD] Warfarin - Pharmacist Dosing Inpatient   Does not apply q1800    Infusions:    PRN Medications: [MAR HOLD] sodium chloride, [MAR HOLD] sodium chloride, [MAR HOLD] acetaminophen, [MAR HOLD] acetaminophen, [MAR HOLD] ALPRAZolam, [MAR HOLD] calcium carbonate (dosed in mg elemental calcium), [MAR HOLD] camphor-menthol, [MAR HOLD] docusate sodium, [MAR HOLD] feeding supplement (NEPRO CARB STEADY), fentaNYL, heparin 6000 unit irrigation, [MAR HOLD] hydrALAZINE, HYDROmorphone (DILAUDID) injection, [MAR HOLD] hydrOXYzine, [MAR HOLD] lidocaine [MAR HOLD] lidocaine-prilocaine, midazolam, [MAR HOLD] ondansetron (ZOFRAN) IV, [MAR HOLD] ondansetron, oxyCODONE, oxyCODONE, [MAR HOLD] pentafluoroprop-tetrafluoroeth, promethazine, sodium chloride, [MAR HOLD] sorbitol, [MAR HOLD] zolpidem   Assessment:   1. Acute on chronic diastolic CHF  2. Permanent atrial fibrillation  3. Morbid obesity  4. Acute/Chronic renal failure, stage 4       --solitary kidney  5. OSA  6. Physical debility/deconditioning - wheelchair bound  7. Noncompliance  8. Hypertension, poorly controlled 9. UTI, cipro started 12/10 10. Acute respiratory failure 11. Anemia - 12. Acute delirium 13. Arthritis - Lt knee 14. Thrombocytopenia  Plan/Discussion:    She is now dialysis dependent. Tolerated HD well. AVF placed. Perm cath now in place.  Heparin off now on argatroban for possible HIT. 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 Resume coumadin per pharmacy.   She will be transferred to [facility] today for rehab to facilitate ability to sit up for HD. I spoke to Dr. Angelina Ok at [facility] about her course.   They do not have argatroban at [facility] but I feel it is OK to use SCDs for DVT prophylaxis and can continue to load coumadin for AF.  I had long discussion with her daughter Desiree Pierce today about transfer to [facility] and she is OK with plan.    Length of Stay: 75 Hodge Stachnik,MD 9:03 AM

## 2012-12-18 NOTE — Preoperative (Signed)
Beta Blockers   Reason not to administer Beta Blockers:Not Applicable 

## 2012-12-18 NOTE — Anesthesia Postprocedure Evaluation (Signed)
  Anesthesia Post-op Note  Patient: Desiree Pierce  Procedure(s) Performed: Procedure(s) (LRB) with comments: INSERTION OF DIALYSIS CATHETER (Right) - right IJ hemodialysis catheter insertion. removal of left IJ hemodialysis catheter  Patient Location: PACU  Anesthesia Type:General  Level of Consciousness: awake  Airway and Oxygen Therapy: Patient Spontanous Breathing  Post-op Pain: mild  Post-op Assessment: Post-op Vital signs reviewed, Patient's Cardiovascular Status Stable, Respiratory Function Stable, Patent Airway, No signs of Nausea or vomiting and Pain level controlled  Post-op Vital Signs: stable  Complications: No apparent anesthesia complications

## 2012-12-18 NOTE — Op Note (Signed)
[facility]  Patient name: Desiree Pierce MRN: [id number] DOB: [birth year] Sex: female  11/19/2012 - 12/18/2012 Pre-operative Diagnosis: ESRD Post-operative diagnosis:  Same Surgeon:  Jorge Ny Assistants:  none Procedure:   1.)  ultrasoun guided placement of Diatek Catheter   2.)  Removeal of diatek catheter Anesthesia:  gen Blood Loss:  See anesthesia record Specimens:  none 3  Indications:  Recent catheter is not functioning.  {atient comes in for a new catheter.  Procedure:  The patient was identified in the holding area and taken to [facility] OR ROOM 16  The patient was then placed supine on the table. general anesthesia was administered.  The patient was prepped and draped in the usual sterile fashion.  A time out was called and antibiotics were administered.  Ultrasound was used to evaluate the right internal jugular vein. It was widely patent and compressible. 1% lidocaine was used for local anesthesia. A #11 blade was used to make a skin nick. The right internal jugular vein was then cannulated under ultrasound guidance with an 18-gauge needle. I was able to advance a 035 J-tipped wire without resistance. Fluoroscopy confirmed that the wire had gone out the innominate vein. I therefore placed a Kumpe catheter to navigate the wire into the heart. I could not easily do this because of the existing catheter. I therefore removed the left-sided existing catheter. This was done by removing the suture and using gentle force to withdraw the cuff and catheter. I then held pressure for hemostasis. With this catheter out of the way I was able to navigate a wire into the right atrium. Sequential dilators were used to dilate the subcutaneous tract. A peel-away sheath was placed. A 23 cm tunneled catheter was advanced through the peel-away sheath which was then removed. A separate skin exit site was selected. A #11 blade was used to make the incision. Subcutaneous tunnel  was created and then dilated. The catheter was brought through the tunnel with the cuff situated the skin exit site. The catheter was connected to the ports. Both ports flushed and aspirated without difficulty. Fluoroscopy confirmed the catheter tip was in good position and that there were no kinks within the catheter. The catheter was sewn into position with 3-0 nylon the skin site in the neck was closed with 4-0 Vicryl. Pressure dressing was placed over the previously placed catheter exit site. There were no complications   Disposition:  To PACU in stable condition.   Juleen China, M.D. [facility] [place] Office: [phone] Pager:  [phone]

## 2012-12-18 NOTE — Transfer of Care (Signed)
Immediate Anesthesia Transfer of Care Note  Patient: Desiree Pierce  Procedure(s) Performed: Procedure(s) (LRB) with comments: INSERTION OF DIALYSIS CATHETER (Right) - right IJ hemodialysis catheter insertion. removal of left IJ hemodialysis catheter  Patient Location: PACU  Anesthesia Type:General  Level of Consciousness: awake, alert  and oriented  Airway & Oxygen Therapy: Patient Spontanous Breathing and Patient connected to face mask oxygen  Post-op Assessment: Report given to PACU RN and Post -op Vital signs reviewed and stable  Post vital signs: Reviewed and stable  Complications: No apparent anesthesia complications

## 2012-12-18 NOTE — Progress Notes (Signed)
Recommended to primary RN to obtain order to d/c right [place] TLC(placed 11-29).New right [facility] HDC placed by radiology today beside right [place] TL CVC which can cause increased risk for infection when exposed for dsg changed when patient receives HD.

## 2012-12-18 NOTE — Procedures (Deleted)
Patient was seen on dialysis and the procedure was supervised. BFR 400 Via AVF BP is 139/61.  Patient appears to be tolerating treatment well.

## 2012-12-18 NOTE — Progress Notes (Signed)
Spoke with daughter Desiree Pierce  About  O R  Schedule.he voiced concerns r/t  Patient being d/c to   New facility  Without  Her  Knowledge  , stating  Her mom is  Not  Totally clear  From  Previous anesthesia.  She would appreciate if  Someone  Spoke  To her  ASAP ,because this is  Very upsetting.  I will  Pass this along to  Am  Child psychotherapist.

## 2012-12-18 NOTE — Progress Notes (Signed)
ANTICOAGULATION CONSULT NOTE - Follow Up Consult  Pharmacy Consult for Coumadin Indication: atrial fibrillation  No Known Allergies  Patient Measurements: Height: 5\' 2"  (157.5 cm) Weight: 196 lb 6.9 oz (89.1 kg) IBW/kg (Calculated) : 50.1   Vital Signs: Temp: 97.9 F (36.6 C) (12/28 1140) Temp src: Oral (12/28 1140) BP: 154/98 mmHg (12/28 1140) Pulse Rate: 93  (12/28 1140)  Labs:  Basename 12/18/12 1100 12/18/12 1058 12/18/12 0724 12/17/12 1002 12/17/12 0700 12/17/12 0455 12/16/12 2206 12/16/12 2033  HGB 8.3* -- 9.2* -- -- -- -- --  HCT 28.3* -- 27.0* -- -- 26.7* -- --  PLT 102* -- -- -- -- 99* -- 76*  APTT -- -- -- 115* -- 91* 80* --  LABPROT -- 20.0* -- -- -- 29.1* -- 22.9*  INR -- 1.77* -- -- -- 2.94* -- 2.13*  HEPARINUNFRC -- -- -- -- -- -- -- --  CREATININE -- 3.13* -- -- 3.20* 3.12* -- --  CKTOTAL -- -- -- -- -- -- -- --  CKMB -- -- -- -- -- -- -- --  TROPONINI -- -- -- -- -- -- -- --    Estimated Creatinine Clearance: 15.1 ml/min (by C-G formula based on Cr of 3.13).  Assessment: 81yof with afib known to pharmacy from argatroban dosing (r/o HIT - HIT panel pending) stopped argatroban and restart coumadin last night (Dr. Gala Romney aware of low platelet count) INR is 1.77. Hgb low but stable, plt 102, trending up.  Dose pta = 2.5mg  daily except 3.75mg  MWF.  Goal of Therapy:  INR 2-3 Monitor platelets by anticoagulation protocol: Yes   Plan:  1) Coumadin 4 mg po x 1 tonight 2) Daily INR  Bayard Hugger, PharmD, BCPS  Clinical Pharmacist  Pager: [phone]  12/18/2012,12:25 PM

## 2012-12-19 LAB — RENAL FUNCTION PANEL
BUN: 81 mg/dL — ABNORMAL HIGH (ref 6–23)
CO2: 25 mEq/L (ref 19–32)
Calcium: 9.5 mg/dL (ref 8.4–10.5)
Glucose, Bld: 136 mg/dL — ABNORMAL HIGH (ref 70–99)
Phosphorus: 5.9 mg/dL — ABNORMAL HIGH (ref 2.3–4.6)

## 2012-12-19 LAB — CBC
HCT: 26.7 % — ABNORMAL LOW (ref 36.0–46.0)
MCH: 27.3 pg (ref 26.0–34.0)
MCV: 92.4 fL (ref 78.0–100.0)
Platelets: 128 10*3/uL — ABNORMAL LOW (ref 150–400)
RDW: 21.3 % — ABNORMAL HIGH (ref 11.5–15.5)

## 2012-12-19 MED ORDER — LIDOCAINE HCL (PF) 1 % IJ SOLN: 5.0000 mL | INTRAMUSCULAR | Status: DC | PRN

## 2012-12-19 MED ORDER — PENTAFLUOROPROP-TETRAFLUOROETH EX AERO: 1.0000 "application " | INHALATION_SPRAY | CUTANEOUS | Status: DC | PRN

## 2012-12-19 MED ORDER — CLONIDINE HCL 0.1 MG/24HR TD PTWK
0.1000 mg | MEDICATED_PATCH | TRANSDERMAL | Status: DC
  Administered 2012-12-19: 0.1 mg via TRANSDERMAL
  Filled 2012-12-19: qty 1

## 2012-12-19 MED ORDER — SODIUM CHLORIDE 0.9 % IV SOLN: 125.0000 mg | INTRAVENOUS | Status: DC

## 2012-12-19 MED ORDER — CLONIDINE HCL 0.1 MG/24HR TD PTWK: 1.0000 | MEDICATED_PATCH | TRANSDERMAL | Status: DC

## 2012-12-19 MED ORDER — NEPRO/CARBSTEADY PO LIQD: 237.0000 mL | ORAL | Status: DC | PRN

## 2012-12-19 MED ORDER — DOXERCALCIFEROL 4 MCG/2ML IV SOLN: 4.0000 ug | INTRAVENOUS | Status: DC

## 2012-12-19 MED ORDER — DARBEPOETIN ALFA-POLYSORBATE 150 MCG/0.3ML IJ SOLN: 150.0000 ug | INTRAMUSCULAR | Status: DC

## 2012-12-19 MED ORDER — LIDOCAINE-PRILOCAINE 2.5-2.5 % EX CREA: 1.0000 "application " | TOPICAL_CREAM | CUTANEOUS | Status: DC | PRN

## 2012-12-19 MED ORDER — WARFARIN SODIUM 4 MG PO TABS
4.0000 mg | ORAL_TABLET | Freq: Once | ORAL | Status: DC
  Filled 2012-12-19: qty 1

## 2012-12-19 MED ORDER — WARFARIN SODIUM 4 MG PO TABS: 4.0000 mg | ORAL_TABLET | Freq: Once | ORAL | Status: DC

## 2012-12-19 NOTE — Progress Notes (Signed)
ANTICOAGULATION CONSULT NOTE - Follow Up Consult  Pharmacy Consult for Coumadin Indication: atrial fibrillation  No Known Allergies  Patient Measurements: Height: 5\' 2"  (157.5 cm) Weight: 204 lb 9.4 oz (92.8 kg) IBW/kg (Calculated) : 50.1   Vital Signs: Temp: 97.6 F (36.4 C) (12/29 0648) Temp src: Oral (12/29 0648) BP: 158/94 mmHg (12/29 0648) Pulse Rate: 100  (12/29 0648)  Labs:  Basename 12/19/12 0500 12/18/12 1100 12/18/12 1058 12/18/12 0724 12/17/12 1002 12/17/12 0700 12/17/12 0455 12/16/12 2206  HGB 7.9* 8.3* -- -- -- -- -- --  HCT 26.7* 28.3* -- 27.0* -- -- -- --  PLT 128* 102* -- -- -- -- 99* --  APTT -- -- -- -- 115* -- 91* 80*  LABPROT 17.9* -- 20.0* -- -- -- 29.1* --  INR 1.52* -- 1.77* -- -- -- 2.94* --  HEPARINUNFRC -- -- -- -- -- -- -- --  CREATININE 3.60* -- 3.13* -- -- 3.20* -- --  CKTOTAL -- -- -- -- -- -- -- --  CKMB -- -- -- -- -- -- -- --  TROPONINI -- -- -- -- -- -- -- --    Estimated Creatinine Clearance: 13.4 ml/min (by C-G formula based on Cr of 3.6).  Assessment: 81yof with afib known to pharmacy from argatroban dosing (r/o HIT - HIT panel pending) stopped argatroban and restart coumadin on 12/27. INR 1.52 after coumadin 4mg  given on 12/27 and 12/28, yesterday's INR might still be slightly affected by argatroban. Hgb low but stable, plt 128, trending up.  Dose pta = 2.5mg  daily except 3.75mg  MWF.  Goal of Therapy:  INR 2-3 Monitor platelets by anticoagulation protocol: Yes   Plan:  1) Coumadin 4 mg po x 1 tonight 2) Daily INR  Bayard Hugger, PharmD, BCPS  Clinical Pharmacist  Pager: [phone]  12/19/2012,10:58 AM

## 2012-12-19 NOTE — Progress Notes (Signed)
Advanced Heart Failure Rounding Note   Subjective:    Desiree Pierce is a 78 yo female with PMHx s/f chronic diastolic CHF, congenital solitary kidney, CKD (stage IV, baseline Cr 1.5-2.4), COPD + OSA + morbid obesity ->secondary pulmonary HTN, permanent atrial fibrillation, OSA, GERD.Admitted with recurrent diastolic HF and RHF. Admit weight 232 pounds.   Since admission has had worsening renal function and not HD dependent. Doing well with HD.   Heparin switched to argatroban due to possible HIT. HIT panel still pending. Underwent AVF and perm cath placement (12/26). Had replacement of perm-cath yesterday.  Doing OK. No dyspnea or orthopnea.   Objective:    Vital Signs:   Temp:  [97.6 F (36.4 C)-98.7 F (37.1 C)] 97.6 F (36.4 C) (12/29 9604) Pulse Rate:  [93-102] 100  (12/29 0648) Resp:  [18-20] 20  (12/29 0648) BP: (147-168)/(83-98) 158/94 mmHg (12/29 0648) SpO2:  [100 %] 100 % (12/29 0648) Weight:  [92.8 kg (204 lb 9.4 oz)] 92.8 kg (204 lb 9.4 oz) (12/28 2200) Last BM Date: 12/17/12  Weight change: Filed Weights   12/17/12 0910 12/17/12 2101 12/18/12 2200  Weight: 88.4 kg (194 lb 14.2 oz) 89.1 kg (196 lb 6.9 oz) 92.8 kg (204 lb 9.4 oz)    Intake/Output:   Intake/Output Summary (Last 24 hours) at 12/19/12 1102 Last data filed at 12/18/12 2100  Gross per 24 hour  Intake    240 ml  Output      3 ml  Net    237 ml     Physical Exam:  General:  No acute distress HEENT: normal Neck: supple. JVP  jaw. Carotids 2+ bilat; no bruits. No lymphadenopathy or thryomegaly appreciated. RIJ catheter Cor: PMI nondisplaced. Irregular rate & rhythm. No rubs, gallops or murmurs. Lungs: diminished. On 2 liters [place] Abdomen: soft, nontender, nondistended. No hepatosplenomegaly. No bruits or masses. Sluggish bowel sounds. Extremities: no cyanosis, clubbing, rash, no edema. RA changes Neuro: alert & orientedx3, cranial nerves grossly intact. moves all 4 extremities w/o difficulty. Affect  pleasant. Intermittently confused  Telemetry: A fib 70-80s  Labs: Basic Metabolic Panel:  Lab 12/19/12 5409 12/18/12 1058 12/18/12 0724 12/17/12 0700 12/17/12 0455 12/16/12 2033 12/15/12 0500  NA 138 140 142 137 139 -- --  K 4.6 3.7 3.8 4.0 3.9 -- --  CL 97 101 -- 98 100 99 --  CO2 25 25 -- 27 26 24  --  GLUCOSE 136* 85 86 96 95 -- --  BUN 81* 70* -- 65* 64* 54* --  CREATININE 3.60* 3.13* -- 3.20* 3.12* 3.00* --  CALCIUM 9.5 9.1 -- 9.5 -- -- --  MG -- -- -- -- -- -- --  PHOS 5.9* 5.1* -- -- 4.8* 5.0* 3.0    Liver Function Tests:  Lab 12/19/12 0500 12/18/12 1058 12/17/12 0700 12/17/12 0455 12/16/12 2033  AST -- -- 19 -- --  ALT -- -- 13 -- --  ALKPHOS -- -- 88 -- --  BILITOT -- -- 0.5 -- --  PROT -- -- 7.6 -- --  ALBUMIN 3.1* 3.0* 3.0* 2.8* 2.9*   No results found for this basename: LIPASE:5,AMYLASE:5 in the last 168 hours No results found for this basename: AMMONIA:3 in the last 168 hours  CBC:  Lab 12/19/12 0500 12/18/12 1100 12/18/12 0724 12/17/12 0455 12/16/12 2033 12/16/12 0724  WBC 7.2 9.8 -- 11.1* 8.7 10.1  NEUTROABS -- -- -- -- -- --  HGB 7.9* 8.3* 9.2* 8.2* 8.1* --  HCT 26.7* 28.3* 27.0* 26.7*  27.3* --  MCV 92.4 91.6 -- 90.2 91.0 91.5  PLT 128* 102* -- 99* 76* 85*    Cardiac Enzymes: No results found for this basename: CKTOTAL:5,CKMB:5,CKMBINDEX:5,TROPONINI:5 in the last 168 hours  BNP: BNP (last 3 results)  Basename 11/19/12 1655  PROBNP 7488.0*     Medications:     Scheduled Medications:    . antiseptic oral rinse  15 mL Mouth Rinse BID  . darbepoetin (ARANESP) injection - DIALYSIS  150 mcg Intravenous Q Tue-HD  . diltiazem  120 mg Oral QHS  . doxercalciferol  4 mcg Intravenous Q T,Th,Sa-HD  . feeding supplement (NEPRO CARB STEADY)  237 mL Oral BID BM  . feeding supplement  30 mL Oral BID  . ferric gluconate (FERRLECIT/NULECIT) IV  125 mg Intravenous Q Tue-HD  . multivitamin  1 tablet Oral QHS  . senna-docusate  1 tablet Oral Daily  .  sodium chloride  10 mL Intravenous Q12H  . traMADol  50 mg Oral Q12H  . Warfarin - Pharmacist Dosing Inpatient   Does not apply q1800    Infusions:    PRN Medications: sodium chloride, sodium chloride, acetaminophen, acetaminophen, ALPRAZolam, calcium carbonate (dosed in mg elemental calcium), camphor-menthol, docusate sodium, feeding supplement (NEPRO CARB STEADY), hydrALAZINE, hydrOXYzine, lidocaine, lidocaine-prilocaine, ondansetron (ZOFRAN) IV, ondansetron, pentafluoroprop-tetrafluoroeth, sorbitol, zolpidem   Assessment:   1. Acute on chronic diastolic CHF  2. Permanent atrial fibrillation  3. Morbid obesity  4. Acute/Chronic renal failure, stage 4       --solitary kidney  5. OSA  6. Physical debility/deconditioning - wheelchair bound  7. Noncompliance  8. Hypertension, poorly controlled 9. UTI, cipro started 12/10 10. Acute respiratory failure 11. Anemia - 12. Acute delirium 13. Arthritis - Lt knee 14. Thrombocytopenia  Plan/Discussion:    She is now dialysis dependent. Tolerated HD well. AVF placed. Perm cath now in place.  Heparin off now on argatroban for possible HIT. 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 Resume coumadin per pharmacy.   She will be transferred to [facility] today for rehab to facilitate ability to sit up for HD. I spoke to Dr. Angelina Ok at [facility] about her course.   They do not have argatroban at [facility] but I feel it is OK to use SCDs for DVT prophylaxis and can continue to load coumadin for AF.  I had long discussion with her daughter Brand Males yesterday about transfer to [facility] and she is OK with plan.    Length of Stay: 30 Shirle Provencal,MD 11:02 AM

## 2012-12-19 NOTE — Clinical Social Work Note (Addendum)
CSW received phone call from Dr. Gala Romney regarding LTAC placement for patient. CSW informed MD that care management facilitates these discharges. CSW informed MD that RN Sharlyne Cai was aware of this yesterday. CSW spoke to [place] via telephone at 1339 re: placement and CSW informed RN that [facility] is the one she needs to contact. CSW referred [place] to Care management.   Per chart review, [facility] unit CSW, signed off case 12/17/12 at 1515 due to [facility] placement being a care management service. CSW signing off, no other psychosocial concerns identified. Please re-consult as needed.   Lia Foyer, LCSWA [facility] Clinical Social Worker Contact #: [phone] (weekend)

## 2012-12-19 NOTE — Progress Notes (Signed)
Patient ID: Desiree Pierce, female   DOB: [birth year], 78 y.o.   MRN: [id number]  [facility] Progress Note    Subjective:   Feels better   Objective:   BP 158/94  Pulse 100  Temp 97.6 F (36.4 C) (Oral)  Resp 20  Ht 5\' 2"  (1.575 m)  Wt 92.8 kg (204 lb 9.4 oz)  BMI 37.42 kg/m2  SpO2 100%  Physical Exam: Gen:WD WN obese AAF in NAD, sitting on edge of bed CVS:no rub Resp:CTA WUJ:WJXBJY Ext:tr pretib edema, L wrist AVF +T/B  Labs: BMET  Lab 12/19/12 0500 12/18/12 1058 12/18/12 0724 12/17/12 0700 12/17/12 0455 12/16/12 2033 12/15/12 0500 12/14/12 0500 12/13/12 0400  NA 138 140 142 137 139 138 140 -- --  K 4.6 3.7 3.8 4.0 3.9 3.9 3.8 -- --  CL 97 101 -- 98 100 99 101 102 --  CO2 25 25 -- 27 26 24 31 28  --  GLUCOSE 136* 85 86 96 95 170* 94 -- --  BUN 81* 70* -- 65* 64* 54* 27* 32* --  CREATININE 3.60* 3.13* -- 3.20* 3.12* 3.00* 2.12* 2.55* --  ALBUMIN 3.1* 3.0* -- 3.0* 2.8* 2.9* 3.0* 3.0* --  CALCIUM 9.5 9.1 -- 9.5 9.2 9.5 9.0 9.3 --  PHOS 5.9* 5.1* -- -- 4.8* 5.0* 3.0 3.3 2.6   CBC  Lab 12/19/12 0500 12/18/12 1100 12/18/12 0724 12/17/12 0455 12/16/12 2033  WBC 7.2 9.8 -- 11.1* 8.7  NEUTROABS -- -- -- -- --  HGB 7.9* 8.3* 9.2* 8.2* --  HCT 26.7* 28.3* 27.0* 26.7* --  MCV 92.4 91.6 -- 90.2 91.0  PLT 128* 102* -- 99* 76*    @IMGRELPRIORS @ Medications:      . antiseptic oral rinse  15 mL Mouth Rinse BID  . darbepoetin (ARANESP) injection - DIALYSIS  150 mcg Intravenous Q Tue-HD  . diltiazem  120 mg Oral QHS  . doxercalciferol  4 mcg Intravenous Q T,Th,Sa-HD  . feeding supplement (NEPRO CARB STEADY)  237 mL Oral BID BM  . feeding supplement  30 mL Oral BID  . ferric gluconate (FERRLECIT/NULECIT) IV  125 mg Intravenous Q Tue-HD  . multivitamin  1 tablet Oral QHS  . senna-docusate  1 tablet Oral Daily  . sodium chloride  10 mL Intravenous Q12H  . traMADol  50 mg Oral Q12H  . Warfarin - Pharmacist Dosing Inpatient   Does not apply q1800      Assessment/ Plan:   1. New ESRD.  S/p replacement of PC from [facility] to RIJ due to poor placement.  Was supposed to be transferred to [facility] yesterday but plans to go today.  Also has Left wrist AVF but will need to have TLC removed before transferring to [facility].  Cont with HD on TTS schedule, so is due for HD tomorrow at [facility].  2. Anemia epo/fe  3. HPTH vit D  4. CHF stable  5. Afib rate controlled  6. COPD  7. Arthritis tx NSAIDS  8. Vascular access- s/p new RIJ PC (placed 12/18/12) as above and Left AVF +T/B (placed 12/17/12)  9. Thrombocytopenia- ?HIT. No heparin. 10. Dispo- for transfer to [facility] today. Marivel Mcclarty A 12/19/2012, 10:03 AM

## 2012-12-20 ENCOUNTER — Encounter (HOSPITAL_COMMUNITY): Payer: Self-pay | Admitting: Surgery

## 2012-12-20 LAB — HEPARIN INDUCED THROMBOCYTOPENIA PNL
Heparin Induced Plt Ab: NEGATIVE
Patient O.D.: 0.139
UFH Low Dose 0.1 IU/mL: 0 % Release
UFH Low Dose 0.5 IU/mL: 0 % Release
UFH SRA Result: NEGATIVE

## 2012-12-20 NOTE — Discharge Summary (Signed)
Agree wit d/c summary. See my daily rounding note of same day for further details.   Truman Hayward 8:43 PM

## 2013-01-20 ENCOUNTER — Other Ambulatory Visit: Payer: Self-pay | Admitting: *Deleted

## 2013-01-20 DIAGNOSIS — N186 End stage renal disease: Secondary | ICD-10-CM

## 2013-01-20 DIAGNOSIS — Z4931 Encounter for adequacy testing for hemodialysis: Secondary | ICD-10-CM

## 2013-02-09 ENCOUNTER — Encounter: Payer: Self-pay | Admitting: Vascular Surgery

## 2013-02-10 ENCOUNTER — Ambulatory Visit (INDEPENDENT_AMBULATORY_CARE_PROVIDER_SITE_OTHER): Payer: Medicare Other | Admitting: Vascular Surgery

## 2013-02-10 ENCOUNTER — Encounter (INDEPENDENT_AMBULATORY_CARE_PROVIDER_SITE_OTHER): Payer: Medicare Other | Admitting: *Deleted

## 2013-02-10 ENCOUNTER — Encounter: Payer: Self-pay | Admitting: Vascular Surgery

## 2013-02-10 VITALS — BP 148/98 | HR 111 | Resp 18 | Ht 62.0 in | Wt 175.0 lb

## 2013-02-10 DIAGNOSIS — Z4801 Encounter for change or removal of surgical wound dressing: Secondary | ICD-10-CM

## 2013-02-10 DIAGNOSIS — N186 End stage renal disease: Secondary | ICD-10-CM

## 2013-02-10 DIAGNOSIS — Z4931 Encounter for adequacy testing for hemodialysis: Secondary | ICD-10-CM

## 2013-02-10 NOTE — Progress Notes (Signed)
Patient is a 79-year-old female who returns today after placement of a left radiocephalic fistula in December of 2013. This is Dr. Brabham. The patient is currently dialyzing via left side a catheter. She denies any numbness tingling or aching in her left hand. Chronic medical problems remain pulmonary hypertension, congestive failure, COPD, atrial fibrillation, obesity, and these are all currently controlled.  She also has a history of poorly controlled hypertension.  Past Medical History  Diagnosis Date  . Morbid obesity   . Atrial fibrillation   . Arthritis   . Allergy history unknown   . Hypertension     Poorly controlled  . CHF (congestive heart failure)   . Pulmonary hypertension   . GERD (gastroesophageal reflux disease)   . COPD (chronic obstructive pulmonary disease)   . OA (osteoarthritis)   . Solitary kidney, congenital     Past Surgical History  Procedure Laterality Date  . Total abdominal hysterectomy    . Total knee arthroplasty      Right knee  . Ankle fracture surgery      Right ankle repair  . Insertion of dialysis catheter  12/16/2012    Procedure: INSERTION OF DIALYSIS CATHETER;  Surgeon: Vance W Brabham, MD;  Location: [facility] OR;  Service: Vascular;  Laterality: Left;  . Av fistula placement  12/16/2012    Procedure: ARTERIOVENOUS (AV) FISTULA CREATION;  Surgeon: Vance W Brabham, MD;  Location: [facility] OR;  Service: Vascular;  Laterality: Left;  . Insertion of dialysis catheter  12/18/2012    Procedure: INSERTION OF DIALYSIS CATHETER;  Surgeon: Vance W Brabham, MD;  Location: [facility] OR;  Service: Vascular;  Laterality: Right;  right IJ hemodialysis catheter insertion. removal of left IJ hemodialysis catheter   Current Outpatient Prescriptions on File Prior to Visit  Medication Sig Dispense Refill  . acetaminophen (TYLENOL) 325 MG tablet Take 2 tablets (650 mg total) by mouth every 4 (four) hours as needed.      . ALPRAZolam (XANAX) 0.25 MG tablet Take 1 tablet (0.25 mg  total) by mouth 2 (two) times daily as needed for anxiety.  30 tablet    . antiseptic oral rinse (BIOTENE) LIQD 15 mLs by Mouth Rinse route 2 (two) times daily.      . calcium carbonate, dosed in mg elemental calcium, 1250 MG/5ML Take 5 mLs (500 mg of elemental calcium total) by mouth every 6 (six) hours as needed.  450 mL    . camphor-menthol (SARNA) lotion Apply 1 application topically every 8 (eight) hours as needed for itching.  222 mL    . Casanthranol-Docusate Sodium 30-100 MG CAPS Take 1 capsule by mouth daily.        . cloNIDine (CATAPRES - DOSED IN MG/24 HR) 0.1 mg/24hr patch Place 1 patch (0.1 mg total) onto the skin once a week.  4 patch    . darbepoetin (ARANESP) 150 MCG/0.3ML SOLN Inject 0.3 mLs (150 mcg total) into the vein every Tuesday with hemodialysis.  1.68 mL    . diltiazem (CARDIZEM CD) 120 MG 24 hr capsule Take 1 capsule (120 mg total) by mouth at bedtime.      . docusate sodium (ENEMEEZ) 283 MG enema Place 1 enema (283 mg total) rectally as needed.  30 each    . doxercalciferol (HECTOROL) 4 MCG/2ML injection Inject 2 mLs (4 mcg total) into the vein Every Tuesday,Thursday,and Saturday with dialysis.  2 mL    . feeding supplement (PRO-STAT SUGAR FREE 64) LIQD Take 30 mLs by mouth 2 (  two) times daily.  900 mL    . hydrALAZINE (APRESOLINE) 20 MG/ML injection Inject 0.5 mLs (10 mg total) into the vein every 2 (two) hours as needed (administer for SBP>160).  1 mL    . hydrOXYzine (ATARAX/VISTARIL) 25 MG tablet Take 1 tablet (25 mg total) by mouth every 8 (eight) hours as needed for itching.  30 tablet    . lidocaine (XYLOCAINE) 1 % SOLN injection Inject 5 mLs into the skin as needed (topical anesthesia for hemodialysis ifGEBAUERS is ineffective.).      . lidocaine-prilocaine (EMLA) cream Apply 1 application topically as needed (topical anesthesia for hemodialysis if Gebauers and Lidocaine injection are ineffective.).  30 g    . multivitamin (RENA-VIT) TABS tablet Take 1 tablet by  mouth at bedtime.      . Nutritional Supplements (FEEDING SUPPLEMENT, NEPRO CARB STEADY,) LIQD Take 237 mLs by mouth 2 (two) times daily between meals.      . Nutritional Supplements (FEEDING SUPPLEMENT, NEPRO CARB STEADY,) LIQD Take 237 mLs by mouth as needed (missed meal during dialysis.).      . ondansetron (ZOFRAN) 4 MG tablet Take 1 tablet (4 mg total) by mouth every 6 (six) hours as needed for nausea.  20 tablet    . pentafluoroprop-tetrafluoroeth (GEBAUERS) AERO Apply 1 application topically as needed (topical anesthesia for hemodialysis).      . sodium chloride 0.9 % SOLN 100 mL with ferric gluconate 12.5 MG/ML SOLN 125 mg Inject 125 mg into the vein every Tuesday with hemodialysis.      . sorbitol 70 % SOLN Take 30 mLs by mouth as needed.      . traMADol (ULTRAM) 50 MG tablet Take 1 tablet (50 mg total) by mouth every 12 (twelve) hours.  30 tablet    . warfarin (COUMADIN) 2.5 MG tablet Take 2.5 mg by mouth daily. Patient takes 1 tablet (2.5 mg) every day except for Monday, Wednesday and Fridays she takes 1.5 tablets (3.75 mg)      . warfarin (COUMADIN) 4 MG tablet Take 1 tablet (4 mg total) by mouth one time only at 6 PM.      . zolpidem (AMBIEN) 5 MG tablet Take 1 tablet (5 mg total) by mouth at bedtime as needed for sleep (Insomnia).  30 tablet    . [DISCONTINUED] metolazone (ZAROXOLYN) 2.5 MG tablet Take once a week. Or as directed by physician  10 tablet  6   No current facility-administered medications on file prior to visit.     Review of systems: She has shortness of breath with exertion. She currently is residing in a assisted-living facility. Her current dialysis today is Monday Wednesday and Friday. She is on Coumadin for atrial fibrillation.  Physical exam:  Filed Vitals:   02/10/13 1552  BP: 148/98  Pulse: 111  Resp: 18  Height: 5' 2" (1.575 m)  Weight: 175 lb (79.379 kg)   Left upper extremity: Healing left wrist incision with some eschar at the proximal aspect,  palpable thrill in left forearm but the vein is not visible throughout its course Right upper extremity: 2+ brachial  absent radial pulse  Chest: Clear to auscultation bilaterally  Cardiac: Regular rate and rhythm  Data: The patient had a duplex of her left radiocephalic AV fistula today. There is a lengthy segment narrowing greater than 2 cm in the middle third of the fistula. The fistula is a 4-5 mm in diameter in the mid forearm. It is less than 2 mm in   diameter over the proximal third.  Assessment: Non-maturing left radiocephalic AV fistula. I offered the patient and her family today the option of a fistulogram to see if there was an intervention we could perform try to improve the fistula. However I did express to them that I had doubts that this fistula was going to mature. I believe the best option at this point would be to place a left basilic vein transposition fistula. The basilic vein is of much better quality in diameter.  Plan: Ligation of left radiocephalic fistula and placement of a left basilic vein transposition fistula on 02/14/2013. Risks benefits possible complications and procedure details were discussed with the patient and her family today. These include but are not limited to non-maturation of the fistula, ischemic steal, wound complications and bleeding complications. We will need to stop her Coumadin 3 days prior to the procedure.  Shakoya Gilmore, MD [facility] Office: [phone] Pager: [phone]   

## 2013-02-11 ENCOUNTER — Other Ambulatory Visit: Payer: Self-pay | Admitting: *Deleted

## 2013-02-11 ENCOUNTER — Encounter: Payer: Self-pay | Admitting: *Deleted

## 2013-02-14 ENCOUNTER — Encounter (HOSPITAL_COMMUNITY): Payer: Self-pay

## 2013-02-14 MED ORDER — DEXTROSE 5 % IV SOLN
1.5000 g | INTRAVENOUS | Status: AC
  Administered 2013-02-15: 1.5 g via INTRAVENOUS
  Filled 2013-02-14 (×2): qty 1.5

## 2013-02-15 ENCOUNTER — Encounter (HOSPITAL_COMMUNITY): Payer: Self-pay | Admitting: Pharmacy Technician

## 2013-02-15 ENCOUNTER — Ambulatory Visit (HOSPITAL_COMMUNITY): Payer: Medicare Other | Admitting: Certified Registered"

## 2013-02-15 ENCOUNTER — Encounter (HOSPITAL_COMMUNITY): Payer: Self-pay | Admitting: *Deleted

## 2013-02-15 ENCOUNTER — Encounter (HOSPITAL_COMMUNITY): Payer: Self-pay | Admitting: Certified Registered"

## 2013-02-15 ENCOUNTER — Encounter (HOSPITAL_COMMUNITY)
Admission: RE | Disposition: A | Discharge status: Skilled Nursing Facility | Payer: Self-pay | Source: Ambulatory Visit | Attending: Vascular Surgery

## 2013-02-15 ENCOUNTER — Ambulatory Visit (HOSPITAL_COMMUNITY)
Admission: RE | Admit: 2013-02-15 | Discharge: 2013-02-15 | Disposition: A | Discharge status: Skilled Nursing Facility | Payer: Medicare Other | Source: Ambulatory Visit | Attending: Vascular Surgery | Admitting: Vascular Surgery

## 2013-02-15 ENCOUNTER — Telehealth: Payer: Self-pay | Admitting: Vascular Surgery

## 2013-02-15 DIAGNOSIS — Q602 Renal agenesis, unspecified: Secondary | ICD-10-CM | POA: Insufficient documentation

## 2013-02-15 DIAGNOSIS — I120 Hypertensive chronic kidney disease with stage 5 chronic kidney disease or end stage renal disease: Secondary | ICD-10-CM | POA: Insufficient documentation

## 2013-02-15 DIAGNOSIS — Y832 Surgical operation with anastomosis, bypass or graft as the cause of abnormal reaction of the patient, or of later complication, without mention of misadventure at the time of the procedure: Secondary | ICD-10-CM | POA: Insufficient documentation

## 2013-02-15 DIAGNOSIS — Z992 Dependence on renal dialysis: Secondary | ICD-10-CM | POA: Insufficient documentation

## 2013-02-15 DIAGNOSIS — N186 End stage renal disease: Secondary | ICD-10-CM

## 2013-02-15 DIAGNOSIS — K219 Gastro-esophageal reflux disease without esophagitis: Secondary | ICD-10-CM | POA: Insufficient documentation

## 2013-02-15 DIAGNOSIS — M1990 Unspecified osteoarthritis, unspecified site: Secondary | ICD-10-CM | POA: Insufficient documentation

## 2013-02-15 DIAGNOSIS — Z79899 Other long term (current) drug therapy: Secondary | ICD-10-CM | POA: Insufficient documentation

## 2013-02-15 DIAGNOSIS — J449 Chronic obstructive pulmonary disease, unspecified: Secondary | ICD-10-CM | POA: Insufficient documentation

## 2013-02-15 DIAGNOSIS — J4489 Other specified chronic obstructive pulmonary disease: Secondary | ICD-10-CM | POA: Insufficient documentation

## 2013-02-15 DIAGNOSIS — I4891 Unspecified atrial fibrillation: Secondary | ICD-10-CM | POA: Insufficient documentation

## 2013-02-15 DIAGNOSIS — I509 Heart failure, unspecified: Secondary | ICD-10-CM | POA: Insufficient documentation

## 2013-02-15 DIAGNOSIS — T82598A Other mechanical complication of other cardiac and vascular devices and implants, initial encounter: Secondary | ICD-10-CM | POA: Insufficient documentation

## 2013-02-15 DIAGNOSIS — Z87891 Personal history of nicotine dependence: Secondary | ICD-10-CM | POA: Insufficient documentation

## 2013-02-15 DIAGNOSIS — Z7901 Long term (current) use of anticoagulants: Secondary | ICD-10-CM | POA: Insufficient documentation

## 2013-02-15 HISTORY — PX: LIGATION OF ARTERIOVENOUS  FISTULA: SHX5948

## 2013-02-15 HISTORY — PX: AV FISTULA PLACEMENT: SHX1204

## 2013-02-15 LAB — SURGICAL PCR SCREEN: MRSA, PCR: POSITIVE — AB

## 2013-02-15 LAB — PROTIME-INR
INR: 1.25 (ref 0.00–1.49)
Prothrombin Time: 15.5 seconds — ABNORMAL HIGH (ref 11.6–15.2)

## 2013-02-15 LAB — POCT I-STAT 4, (NA,K, GLUC, HGB,HCT): Potassium: 4.2 mEq/L (ref 3.5–5.1)

## 2013-02-15 LAB — APTT: aPTT: 37 seconds (ref 24–37)

## 2013-02-15 SURGERY — ARTERIOVENOUS (AV) FISTULA CREATION
Anesthesia: General | Site: Arm Upper | Laterality: Left | Wound class: Clean

## 2013-02-15 MED ORDER — OXYCODONE HCL 5 MG PO TABS
ORAL_TABLET | ORAL | Status: AC
  Filled 2013-02-15: qty 1

## 2013-02-15 MED ORDER — FENTANYL CITRATE 0.05 MG/ML IJ SOLN: 25.0000 ug | INTRAMUSCULAR | Status: DC | PRN

## 2013-02-15 MED ORDER — MUPIROCIN 2 % EX OINT
TOPICAL_OINTMENT | Freq: Once | CUTANEOUS | Status: AC
  Administered 2013-02-15: 1 via NASAL
  Filled 2013-02-15 (×2): qty 22

## 2013-02-15 MED ORDER — SODIUM CHLORIDE 0.9 % IR SOLN
Status: DC | PRN
  Administered 2013-02-15: 14:00:00

## 2013-02-15 MED ORDER — LIDOCAINE HCL (CARDIAC) 20 MG/ML IV SOLN
INTRAVENOUS | Status: DC | PRN
  Administered 2013-02-15: 60 mg via INTRAVENOUS

## 2013-02-15 MED ORDER — SODIUM CHLORIDE 0.9 % IV SOLN
10.0000 mg | INTRAVENOUS | Status: DC | PRN
  Administered 2013-02-15: 30 ug/min via INTRAVENOUS

## 2013-02-15 MED ORDER — OXYCODONE HCL 5 MG/5ML PO SOLN: 5.0000 mg | Freq: Once | ORAL | Status: DC | PRN

## 2013-02-15 MED ORDER — OXYCODONE HCL 5 MG PO TABS: 5.0000 mg | ORAL_TABLET | Freq: Once | ORAL | Status: DC | PRN

## 2013-02-15 MED ORDER — ARTIFICIAL TEARS OP OINT
TOPICAL_OINTMENT | OPHTHALMIC | Status: DC | PRN
  Administered 2013-02-15: 1 via OPHTHALMIC

## 2013-02-15 MED ORDER — 0.9 % SODIUM CHLORIDE (POUR BTL) OPTIME
TOPICAL | Status: DC | PRN
  Administered 2013-02-15: 1000 mL

## 2013-02-15 MED ORDER — PROPOFOL 10 MG/ML IV BOLUS
INTRAVENOUS | Status: DC | PRN
  Administered 2013-02-15 (×2): 100 mg via INTRAVENOUS

## 2013-02-15 MED ORDER — OXYCODONE HCL 5 MG PO TABS: 5.0000 mg | ORAL_TABLET | ORAL | Status: DC | PRN

## 2013-02-15 MED ORDER — SODIUM CHLORIDE 0.9 % IV SOLN
INTRAVENOUS | Status: DC
  Administered 2013-02-15: 13:00:00 via INTRAVENOUS

## 2013-02-15 MED ORDER — HEPARIN SODIUM (PORCINE) 1000 UNIT/ML IJ SOLN
INTRAMUSCULAR | Status: DC | PRN
  Administered 2013-02-15: 5000 [IU] via INTRAVENOUS

## 2013-02-15 MED ORDER — FENTANYL CITRATE 0.05 MG/ML IJ SOLN
INTRAMUSCULAR | Status: DC | PRN
  Administered 2013-02-15 (×2): 25 ug via INTRAVENOUS
  Administered 2013-02-15: 50 ug via INTRAVENOUS
  Administered 2013-02-15: 25 ug via INTRAVENOUS

## 2013-02-15 MED ORDER — ONDANSETRON HCL 4 MG/2ML IJ SOLN: 4.0000 mg | Freq: Four times a day (QID) | INTRAMUSCULAR | Status: DC | PRN

## 2013-02-15 MED ORDER — PHENYLEPHRINE HCL 10 MG/ML IJ SOLN
INTRAMUSCULAR | Status: DC | PRN
  Administered 2013-02-15 (×2): 160 ug via INTRAVENOUS
  Administered 2013-02-15: 80 ug via INTRAVENOUS

## 2013-02-15 MED ORDER — PROTAMINE SULFATE 10 MG/ML IV SOLN
INTRAVENOUS | Status: DC | PRN
  Administered 2013-02-15 (×5): 10 mg via INTRAVENOUS

## 2013-02-15 SURGICAL SUPPLY — 45 items
CANISTER SUCTION 2500CC (MISCELLANEOUS) ×3 IMPLANT
CLIP TI MEDIUM 6 (CLIP) ×3 IMPLANT
CLIP TI WIDE RED SMALL 6 (CLIP) ×6 IMPLANT
CLOTH BEACON ORANGE TIMEOUT ST (SAFETY) ×3 IMPLANT
COVER PROBE W GEL 5X96 (DRAPES) ×3 IMPLANT
COVER SURGICAL LIGHT HANDLE (MISCELLANEOUS) ×3 IMPLANT
DECANTER SPIKE VIAL GLASS SM (MISCELLANEOUS) IMPLANT
DERMABOND ADVANCED (GAUZE/BANDAGES/DRESSINGS) ×3
DERMABOND ADVANCED .7 DNX12 (GAUZE/BANDAGES/DRESSINGS) ×6 IMPLANT
DRAIN PENROSE 1/4X12 LTX STRL (WOUND CARE) ×3 IMPLANT
ELECT REM PT RETURN 9FT ADLT (ELECTROSURGICAL) ×3
ELECTRODE REM PT RTRN 9FT ADLT (ELECTROSURGICAL) ×2 IMPLANT
GEL ULTRASOUND 20GR AQUASONIC (MISCELLANEOUS) IMPLANT
GLOVE BIO SURGEON STRL SZ 6.5 (GLOVE) ×6 IMPLANT
GLOVE BIO SURGEON STRL SZ7.5 (GLOVE) ×3 IMPLANT
GLOVE BIO SURGEON STRL SZ8 (GLOVE) ×3 IMPLANT
GLOVE BIOGEL PI IND STRL 7.0 (GLOVE) ×8 IMPLANT
GLOVE BIOGEL PI IND STRL 7.5 (GLOVE) ×2 IMPLANT
GLOVE BIOGEL PI INDICATOR 7.0 (GLOVE) ×4
GLOVE BIOGEL PI INDICATOR 7.5 (GLOVE) ×1
GLOVE SURG SS PI 6.5 STRL IVOR (GLOVE) ×3 IMPLANT
GLOVE SURG SS PI 7.5 STRL IVOR (GLOVE) ×3 IMPLANT
GOWN PREVENTION PLUS XLARGE (GOWN DISPOSABLE) ×9 IMPLANT
GOWN STRL NON-REIN LRG LVL3 (GOWN DISPOSABLE) ×9 IMPLANT
KIT BASIN OR (CUSTOM PROCEDURE TRAY) ×3 IMPLANT
KIT ROOM TURNOVER OR (KITS) ×3 IMPLANT
LOOP VESSEL MINI RED (MISCELLANEOUS) IMPLANT
NS IRRIG 1000ML POUR BTL (IV SOLUTION) ×3 IMPLANT
PACK CV ACCESS (CUSTOM PROCEDURE TRAY) ×3 IMPLANT
PAD ARMBOARD 7.5X6 YLW CONV (MISCELLANEOUS) ×6 IMPLANT
SPONGE SURGIFOAM ABS GEL 100 (HEMOSTASIS) IMPLANT
SUT PROLENE 6 0 CC (SUTURE) IMPLANT
SUT PROLENE 7 0 BV 1 (SUTURE) ×3 IMPLANT
SUT SILK 0 (SUTURE) IMPLANT
SUT SILK 2 0 SH (SUTURE) ×3 IMPLANT
SUT SILK 3 0 (SUTURE) ×2
SUT SILK 3-0 18XBRD TIE 12 (SUTURE) ×4 IMPLANT
SUT VIC AB 3-0 SH 27 (SUTURE) ×4
SUT VIC AB 3-0 SH 27X BRD (SUTURE) ×8 IMPLANT
SUT VICRYL 4-0 PS2 18IN ABS (SUTURE) ×12 IMPLANT
TAPE UMBILICAL COTTON 1/8X30 (MISCELLANEOUS) ×3 IMPLANT
TOWEL OR 17X24 6PK STRL BLUE (TOWEL DISPOSABLE) ×3 IMPLANT
TOWEL OR 17X26 10 PK STRL BLUE (TOWEL DISPOSABLE) ×3 IMPLANT
UNDERPAD 30X30 INCONTINENT (UNDERPADS AND DIAPERS) ×3 IMPLANT
WATER STERILE IRR 1000ML POUR (IV SOLUTION) ×3 IMPLANT

## 2013-02-15 NOTE — Telephone Encounter (Addendum)
Message copied by Shari Prows on Tue Feb 15, 2013  4:16 PM ------      Message from: Marlowe Shores      Created: Tue Feb 15, 2013  3:42 PM       4 week F/U BVT - Fields ------An appt was scheduled for the above pt on 03/17/13 at 12:45pm w/ [facility]. I mailed an appt letter and also left message for the pt/awt

## 2013-02-15 NOTE — Anesthesia Postprocedure Evaluation (Signed)
  Anesthesia Post-op Note  Patient: Desiree Pierce  Procedure(s) Performed: Procedure(s): ARTERIOVENOUS (AV) FISTULA CREATION (Left) LIGATION OF ARTERIOVENOUS  FISTULA (Left)  Patient Location: PACU  Anesthesia Type:General  Level of Consciousness: awake  Airway and Oxygen Therapy: Patient Spontanous Breathing  Post-op Pain: mild  Post-op Assessment: Post-op Vital signs reviewed  Post-op Vital Signs: Reviewed  Complications: No apparent anesthesia complications

## 2013-02-15 NOTE — Preoperative (Signed)
Beta Blockers   Reason not to administer Beta Blockers:Not Applicable, last dose of Coreg on 02/15/13 at 0400

## 2013-02-15 NOTE — H&P (View-Only) (Signed)
Patient is a 79 year old female who returns today after placement of a left radiocephalic fistula in December of 2013. This is Dr. Myra Gianotti. The patient is currently dialyzing via left side a catheter. She denies any numbness tingling or aching in her left hand. Chronic medical problems remain pulmonary hypertension, congestive failure, COPD, atrial fibrillation, obesity, and these are all currently controlled.  She also has a history of poorly controlled hypertension.  Past Medical History  Diagnosis Date  . Morbid obesity   . Atrial fibrillation   . Arthritis   . Allergy history unknown   . Hypertension     Poorly controlled  . CHF (congestive heart failure)   . Pulmonary hypertension   . GERD (gastroesophageal reflux disease)   . COPD (chronic obstructive pulmonary disease)   . OA (osteoarthritis)   . Solitary kidney, congenital     Past Surgical History  Procedure Laterality Date  . Total abdominal hysterectomy    . Total knee arthroplasty      Right knee  . Ankle fracture surgery      Right ankle repair  . Insertion of dialysis catheter  12/16/2012    Procedure: INSERTION OF DIALYSIS CATHETER;  Surgeon: Nada Libman, MD;  Location: [facility] OR;  Service: Vascular;  Laterality: Left;  . Av fistula placement  12/16/2012    Procedure: ARTERIOVENOUS (AV) FISTULA CREATION;  Surgeon: Nada Libman, MD;  Location: [facility] OR;  Service: Vascular;  Laterality: Left;  . Insertion of dialysis catheter  12/18/2012    Procedure: INSERTION OF DIALYSIS CATHETER;  Surgeon: Nada Libman, MD;  Location: [facility] OR;  Service: Vascular;  Laterality: Right;  right IJ hemodialysis catheter insertion. removal of left IJ hemodialysis catheter   Current Outpatient Prescriptions on File Prior to Visit  Medication Sig Dispense Refill  . acetaminophen (TYLENOL) 325 MG tablet Take 2 tablets (650 mg total) by mouth every 4 (four) hours as needed.      . ALPRAZolam (XANAX) 0.25 MG tablet Take 1 tablet (0.25 mg  total) by mouth 2 (two) times daily as needed for anxiety.  30 tablet    . antiseptic oral rinse (BIOTENE) LIQD 15 mLs by Mouth Rinse route 2 (two) times daily.      . calcium carbonate, dosed in mg elemental calcium, 1250 MG/5ML Take 5 mLs (500 mg of elemental calcium total) by mouth every 6 (six) hours as needed.  450 mL    . camphor-menthol (SARNA) lotion Apply 1 application topically every 8 (eight) hours as needed for itching.  222 mL    . Casanthranol-Docusate Sodium 30-100 MG CAPS Take 1 capsule by mouth daily.        . cloNIDine (CATAPRES - DOSED IN MG/24 HR) 0.1 mg/24hr patch Place 1 patch (0.1 mg total) onto the skin once a week.  4 patch    . darbepoetin (ARANESP) 150 MCG/0.3ML SOLN Inject 0.3 mLs (150 mcg total) into the vein every Tuesday with hemodialysis.  1.68 mL    . diltiazem (CARDIZEM CD) 120 MG 24 hr capsule Take 1 capsule (120 mg total) by mouth at bedtime.      . docusate sodium (ENEMEEZ) 283 MG enema Place 1 enema (283 mg total) rectally as needed.  30 each    . doxercalciferol (HECTOROL) 4 MCG/2ML injection Inject 2 mLs (4 mcg total) into the vein Every Tuesday,Thursday,and Saturday with dialysis.  2 mL    . feeding supplement (PRO-STAT SUGAR FREE 64) LIQD Take 30 mLs by mouth 2 (  two) times daily.  900 mL    . hydrALAZINE (APRESOLINE) 20 MG/ML injection Inject 0.5 mLs (10 mg total) into the vein every 2 (two) hours as needed (administer for SBP>160).  1 mL    . hydrOXYzine (ATARAX/VISTARIL) 25 MG tablet Take 1 tablet (25 mg total) by mouth every 8 (eight) hours as needed for itching.  30 tablet    . lidocaine (XYLOCAINE) 1 % SOLN injection Inject 5 mLs into the skin as needed (topical anesthesia for hemodialysis ifGEBAUERS is ineffective.).      Marland Kitchen lidocaine-prilocaine (EMLA) cream Apply 1 application topically as needed (topical anesthesia for hemodialysis if Gebauers and Lidocaine injection are ineffective.).  30 g    . multivitamin (RENA-VIT) TABS tablet Take 1 tablet by  mouth at bedtime.      . Nutritional Supplements (FEEDING SUPPLEMENT, NEPRO CARB STEADY,) LIQD Take 237 mLs by mouth 2 (two) times daily between meals.      . Nutritional Supplements (FEEDING SUPPLEMENT, NEPRO CARB STEADY,) LIQD Take 237 mLs by mouth as needed (missed meal during dialysis.).      Marland Kitchen ondansetron (ZOFRAN) 4 MG tablet Take 1 tablet (4 mg total) by mouth every 6 (six) hours as needed for nausea.  20 tablet    . pentafluoroprop-tetrafluoroeth (GEBAUERS) AERO Apply 1 application topically as needed (topical anesthesia for hemodialysis).      . sodium chloride 0.9 % SOLN 100 mL with ferric gluconate 12.5 MG/ML SOLN 125 mg Inject 125 mg into the vein every Tuesday with hemodialysis.      Marland Kitchen sorbitol 70 % SOLN Take 30 mLs by mouth as needed.      . traMADol (ULTRAM) 50 MG tablet Take 1 tablet (50 mg total) by mouth every 12 (twelve) hours.  30 tablet    . warfarin (COUMADIN) 2.5 MG tablet Take 2.5 mg by mouth daily. Patient takes 1 tablet (2.5 mg) every day except for Monday, Wednesday and Fridays she takes 1.5 tablets (3.75 mg)      . warfarin (COUMADIN) 4 MG tablet Take 1 tablet (4 mg total) by mouth one time only at 6 PM.      . zolpidem (AMBIEN) 5 MG tablet Take 1 tablet (5 mg total) by mouth at bedtime as needed for sleep (Insomnia).  30 tablet    . [DISCONTINUED] metolazone (ZAROXOLYN) 2.5 MG tablet Take once a week. Or as directed by physician  10 tablet  6   No current facility-administered medications on file prior to visit.     Review of systems: She has shortness of breath with exertion. She currently is residing in a assisted-living facility. Her current dialysis today is Monday Wednesday and Friday. She is on Coumadin for atrial fibrillation.  Physical exam:  Filed Vitals:   02/10/13 1552  BP: 148/98  Pulse: 111  Resp: 18  Height: 5\' 2"  (1.575 m)  Weight: 175 lb (79.379 kg)   Left upper extremity: Healing left wrist incision with some eschar at the proximal aspect,  palpable thrill in left forearm but the vein is not visible throughout its course Right upper extremity: 2+ brachial  absent radial pulse  Chest: Clear to auscultation bilaterally  Cardiac: Regular rate and rhythm  Data: The patient had a duplex of her left radiocephalic AV fistula today. There is a lengthy segment narrowing greater than 2 cm in the middle third of the fistula. The fistula is a 4-5 mm in diameter in the mid forearm. It is less than 2 mm in  diameter over the proximal third.  Assessment: Non-maturing left radiocephalic AV fistula. I offered the patient and her family today the option of a fistulogram to see if there was an intervention we could perform try to improve the fistula. However I did express to them that I had doubts that this fistula was going to mature. I believe the best option at this point would be to place a left basilic vein transposition fistula. The basilic vein is of much better quality in diameter.  Plan: Ligation of left radiocephalic fistula and placement of a left basilic vein transposition fistula on 02/14/2013. Risks benefits possible complications and procedure details were discussed with the patient and her family today. These include but are not limited to non-maturation of the fistula, ischemic steal, wound complications and bleeding complications. We will need to stop her Coumadin 3 days prior to the procedure.  Fabienne Bruns, MD [facility] Office: [phone] Pager: [phone]

## 2013-02-15 NOTE — Anesthesia Preprocedure Evaluation (Addendum)
Anesthesia Evaluation  Patient identified by MRN, date of birth, ID band Patient awake    Reviewed: Allergy & Precautions, H&P , NPO status , Patient's Chart, lab work & pertinent test results  Airway Mallampati: II TM Distance: >3 FB Neck ROM: full    Dental  (+) Edentulous Upper and Dental Advisory Given   Pulmonary COPDformer smoker,          Cardiovascular hypertension, +CHF + dysrhythmias Atrial Fibrillation Rhythm:Irregular Rate:Normal     Neuro/Psych    GI/Hepatic GERD-  ,  Endo/Other  obese  Renal/GU ESRFRenal disease     Musculoskeletal  (+) Arthritis -,   Abdominal   Peds  Hematology   Anesthesia Other Findings   Reproductive/Obstetrics                          Anesthesia Physical Anesthesia Plan  ASA: III  Anesthesia Plan: General   Post-op Pain Management:    Induction: Intravenous  Airway Management Planned: LMA  Additional Equipment:   Intra-op Plan:   Post-operative Plan: Extubation in OR  Informed Consent: I have reviewed the patients History and Physical, chart, labs and discussed the procedure including the risks, benefits and alternatives for the proposed anesthesia with the patient or authorized representative who has indicated his/her understanding and acceptance.     Plan Discussed with: CRNA and Surgeon  Anesthesia Plan Comments:         Anesthesia Quick Evaluation

## 2013-02-15 NOTE — Interval H&P Note (Signed)
History and Physical Interval Note:  02/15/2013 12:33 PM  Desiree Pierce  has presented today for surgery, with the diagnosis of ESRD  The various methods of treatment have been discussed with the patient and family. After consideration of risks, benefits and other options for treatment, the patient has consented to  Procedure(s) with comments: ARTERIOVENOUS (AV) FISTULA CREATION (Left) LIGATION OF ARTERIOVENOUS  FISTULA (Left) - BVT as a surgical intervention .  The patient's history has been reviewed, patient examined, no change in status, stable for surgery.  I have reviewed the patient's chart and labs.  Questions were answered to the patient's satisfaction.     Franca Stakes E

## 2013-02-15 NOTE — Progress Notes (Signed)
Report called to [facility], and report given to family of patient regarding follow up care

## 2013-02-15 NOTE — Transfer of Care (Signed)
Immediate Anesthesia Transfer of Care Note  Patient: Desiree Pierce  Procedure(s) Performed: Procedure(s): ARTERIOVENOUS (AV) FISTULA CREATION (Left) LIGATION OF ARTERIOVENOUS  FISTULA (Left)  Patient Location: PACU  Anesthesia Type:General  Level of Consciousness: awake, alert  and sedated  Airway & Oxygen Therapy: Patient connected to face mask  Post-op Assessment: Report given to PACU RN  Post vital signs: stable  Complications: No apparent anesthesia complications

## 2013-02-15 NOTE — Op Note (Addendum)
Procedure: Left basilic vein transposition fistula, ligation left radial cephalic AVF  Preoperative diagnosis: End-stage renal disease  Postoperative diagnosis: Same  Anesthesia: Gen.  Assistant: Della Goo PA-C  Operative findings: 3-5 mm left basilic vein, 3 mm left brachial artery  Operative details: After obtaining informed consent, the patient was taken to the operating room. The patient was placed in supine position operating table. After induction of general anesthesia, the patient's entire left upper extremity was prepped and draped in the usual sterile fashion. Next ultrasound was used to identify the left basilic vein. A longitudinal incision was made just above the antecubital crease in order to expose the basilic vein. The vein was of good quality approximately 3-5 mm in diameter throughout its course. Several longitudinal skip incisions were made up the arm from just below the antecubital crease all the way up to the axilla to harvest the basilic vein. Care was taken to try to not injure any sensory nerves and all the motor nerves were identified and protected. The vein was dissected free circumferentially and small side branches ligated and divided between silk ties or clips. Next the brachial artery was exposed by deepening the basilic vein harvest incision just above the antecubital crease. The artery was approximately 3 mm in diameter. This was dissected free circumferentially. Vessel loops were placed around it. There was some spasm within the artery after dissecting it free. Next the distal basilic vein was ligated with a 2 silk tie and the vein transected. The vein was brought out throughout the skip incisions and gently distended with heparinized saline and marked for orientation. The vein was then tunneled subcutaneously in an arcing configuration out over the biceps muscle down to the level of the exposed brachial artery just above the antecubital crease. The patient was given  5000 units of intravenous heparin. Vessel loops were used to control the artery proximally and distally. The vein was cut to length and sewn end of vein to side of artery using a running 7-0 Prolene suture. Just prior completion of the anastomosis, it was forebled backbled and thoroughly flushed. The anastomosis was secured Vesseloops were released.   Initially the flow was fairly sluggish due to spasm. However there was a palpable thrill after a few minutes of the proximal aspect of the fistula. There was also good Doppler flow throughout the course of the fistula. The distal vein was also noted to fill fully. Next, a longitudinal incision was made in the distal forearm over the left radial cephalic fistula.  The incision was carried through the subcutaneous tissues to the pre existing fistula.  This was ligated with a 2 0 silk tie.  At this point all subcutaneous tissues were reapproximated using running 3-0 Vicryl suture. All skin incisions were closed with running 4  0 Vicryl subcuticular stitch. Dermabond was applied to all incisions. The patient tolerated the procedure well and there were no complications. Instrument sponge needle counts were correct at the end of the case. The patient was taken to the recovery room in stable condition.  Fabienne Bruns, MD [facility] [place] Office: [phone] Pager: [phone]

## 2013-02-15 NOTE — Anesthesia Procedure Notes (Signed)
Procedure Name: Intubation Date/Time: 02/15/2013 1:10 PM Performed by: Jefm Miles E Pre-anesthesia Checklist: Patient identified, Timeout performed, Emergency Drugs available, Suction available and Patient being monitored Patient Re-evaluated:Patient Re-evaluated prior to inductionOxygen Delivery Method: Circle system utilized Preoxygenation: Pre-oxygenation with 100% oxygen Intubation Type: IV induction Ventilation: Two handed mask ventilation required Laryngoscope Size: Mac and 3 Grade View: Grade I Tube type: Oral Tube size: 7.0 mm Number of attempts: 1 Airway Equipment and Method: Stylet Placement Confirmation: ETT inserted through vocal cords under direct vision,  positive ETCO2 and breath sounds checked- equal and bilateral Secured at: 22 cm Tube secured with: Tape Dental Injury: Teeth and Oropharynx as per pre-operative assessment  Comments: First attempt with LMA 4, did not get a good Tidal Volume, second attempt with LMA 5, tidal volumes < 200 max , ETT inserted with good airway and tidal volumes. Do not recommend use of LMA in future cases.

## 2013-02-17 ENCOUNTER — Encounter (HOSPITAL_COMMUNITY): Payer: Self-pay | Admitting: Vascular Surgery

## 2013-02-24 ENCOUNTER — Encounter (HOSPITAL_COMMUNITY): Payer: Self-pay

## 2013-02-24 ENCOUNTER — Ambulatory Visit (HOSPITAL_COMMUNITY)
Admission: RE | Admit: 2013-02-24 | Discharge: 2013-02-24 | Disposition: A | Discharge status: Home / Self Care | Payer: Medicare Other | Source: Ambulatory Visit | Attending: Internal Medicine | Admitting: Internal Medicine

## 2013-02-24 VITALS — BP 128/72 | HR 93

## 2013-02-24 DIAGNOSIS — I5032 Chronic diastolic (congestive) heart failure: Secondary | ICD-10-CM | POA: Insufficient documentation

## 2013-02-24 MED ORDER — CARVEDILOL 6.25 MG PO TABS: ORAL_TABLET | ORAL | Status: AC

## 2013-02-24 NOTE — Progress Notes (Signed)
Patient ID: Desiree Pierce, female   DOB: October 11, 1931, 79 y.o.   MRN: [id number]  Nephrology: Dr Briant Cedar.   Weight Range   Baseline proBNP     HPI: Desiree Pierce is a 79 yo female with PMHx s/f chronic diastolic CHF, congenital solitary kidney, CKD (stage IV, baseline Cr 1.5-2.4), COPD + OSA + morbid obesity ->secondary pulmonary HTN, permanent atrial fibrillation, OSA, GERD.  11/22/12 ECHO EF 60% Moderate LVH Mild Mitral regurgitation. + moderateRV dysfunction   12/01/12: [facility]  RA = 21  RV = 80/11/19  PA = 88/34 (56)  PCW = 24 v = 35  Fick cardiac output/index = 4.9/2.5  PVR = 7.3 woods  FA sat = 96%  PA sat = 55%, 57%    Admitted 11/2012 with recurrent diastolic HF and RHF.  Renal failure progressed and she required HD.Developed thrombocytopenia with platelets down to 69K. Heparin stopped on 12/25 and HIT panel sent (pending); Switched to argatroban and PLTs back up to 85K on 12/26.  Discharge weight 204 pounds.  Discharged to [facility].   She returns for follow up. Overall she is feeling much better. Denies SOB/PND/Orthopnea. Requires HD Mon-Wed-Fri. She does have difficulty with hypotension. She transferred from [facility] to [facility] in February. She continues PT at SNF [facility]). Compliant with medications.     ROS: All systems negative except as listed in HPI, PMH and Problem List.  Past Medical History  Diagnosis Date  . Morbid obesity   . Arthritis   . Allergy history unknown   . CHF (congestive heart failure)   . Pulmonary hypertension   . GERD (gastroesophageal reflux disease)   . COPD (chronic obstructive pulmonary disease)   . OA (osteoarthritis)   . Solitary kidney, congenital   . Hypertension     Poorly controlled, DR. Dasanayaka  . Atrial fibrillation     Current Outpatient Prescriptions  Medication Sig Dispense Refill  . acetaminophen (TYLENOL) 325 MG tablet Take 650 mg by mouth every 4 (four) hours as needed for pain or fever.      . bisacodyl  (DULCOLAX) 10 MG suppository Place 10 mg rectally every 12 (twelve) hours as needed for constipation.      . camphor-menthol (SARNA) lotion Apply 1 application topically every 8 (eight) hours as needed for itching.  222 mL    . carvedilol (COREG) 6.25 MG tablet Only take on nondialysis days. Tue, Thurs, Sat, Sun  32 tablet  3  . Casanthranol-Docusate Sodium 30-100 MG CAPS Take 1 capsule by mouth daily.        . darbepoetin (ARANESP) 150 MCG/0.3ML SOLN Inject 0.3 mLs (150 mcg total) into the vein every Tuesday with hemodialysis.  1.68 mL    . diltiazem (CARDIZEM CD) 120 MG 24 hr capsule Take 1 capsule (120 mg total) by mouth at bedtime.      . docusate sodium (ENEMEEZ) 283 MG enema Place 1 enema (283 mg total) rectally as needed.  30 each    . HYDROcodone-acetaminophen (NORCO/VICODIN) 5-325 MG per tablet Take 1 tablet by mouth every 4 (four) hours as needed for pain.      Marland Kitchen lidocaine (XYLOCAINE) 1 % SOLN injection Inject 5 mLs into the skin as needed (topical anesthesia for hemodialysis ifGEBAUERS is ineffective.).      Marland Kitchen lidocaine-prilocaine (EMLA) cream Apply 1 application topically as needed (topical anesthesia for hemodialysis if Gebauers and Lidocaine injection are ineffective.).  30 g    . multivitamin (RENA-VIT) TABS tablet Take 1 tablet  by mouth at bedtime.      Marland Kitchen oxyCODONE (ROXICODONE) 5 MG immediate release tablet Take 1 tablet (5 mg total) by mouth every 4 (four) hours as needed for pain.  30 tablet  0  . pentafluoroprop-tetrafluoroeth (GEBAUERS) AERO Apply 1 application topically as needed (topical anesthesia for hemodialysis).      . polyethylene glycol (MIRALAX / GLYCOLAX) packet Take 17 g by mouth daily.      . sevelamer (RENAGEL) 800 MG tablet Take 800 mg by mouth 3 (three) times daily with meals.      . warfarin (COUMADIN) 2 MG tablet Take 2 mg by mouth daily.      . calcium carbonate, dosed in mg elemental calcium, 1250 MG/5ML Take 5 mLs (500 mg of elemental calcium total) by mouth  every 6 (six) hours as needed.  450 mL    . ondansetron (ZOFRAN) 4 MG tablet Take 1 tablet (4 mg total) by mouth every 6 (six) hours as needed for nausea.  20 tablet    . [DISCONTINUED] metolazone (ZAROXOLYN) 2.5 MG tablet Take once a week. Or as directed by physician  10 tablet  6   No current facility-administered medications for this encounter.     PHYSICAL EXAM: Filed Vitals:   02/24/13 1144  BP: 128/72  Pulse: 93  SpO2: 100%    Physical Exam:  General: No acute distress . Sitting in wheel chair son and daughter present HEENT: normal  Neck: supple. JVP flat Carotids 2+ bilat; no bruits. No lymphadenopathy or thryomegaly appreciated.  Cor: PMI nondisplaced. Irregular rate & rhythm. No rubs, gallops or murmurs.  Lungs: diminished.  Abdomen: soft, nontender, nondistended. No hepatosplenomegaly. No bruits or masses. Bowel sounds present.  Extremities: no cyanosis, clubbing, rash, no edema. RA changes  Neuro: alert & orientedx3, cranial nerves grossly intact. moves all 4 extremities w/o difficulty. Affect pleasant. Intermittently confused     ASSESSMENT & PLAN:

## 2013-02-24 NOTE — Assessment & Plan Note (Signed)
Reivewed most recent discharge summary. Volume status stable on HD. Does have difficulty with hypotension on dialysis days. Cut back carvedilol to non dialysis days (tues, thur, sat, and sun). Follow up in 4 months.

## 2013-03-10 ENCOUNTER — Non-Acute Institutional Stay (SKILLED_NURSING_FACILITY): Payer: Medicare Other | Admitting: Internal Medicine

## 2013-03-10 DIAGNOSIS — I4891 Unspecified atrial fibrillation: Secondary | ICD-10-CM

## 2013-03-10 DIAGNOSIS — Z7901 Long term (current) use of anticoagulants: Secondary | ICD-10-CM

## 2013-03-14 ENCOUNTER — Encounter: Payer: Self-pay | Admitting: Adult Health

## 2013-03-14 ENCOUNTER — Non-Acute Institutional Stay (SKILLED_NURSING_FACILITY): Payer: Medicare Other | Admitting: Adult Health

## 2013-03-14 DIAGNOSIS — Z7901 Long term (current) use of anticoagulants: Secondary | ICD-10-CM

## 2013-03-14 DIAGNOSIS — I4891 Unspecified atrial fibrillation: Secondary | ICD-10-CM

## 2013-03-14 NOTE — Progress Notes (Signed)
Subjective:     Patient ID: Desiree Pierce, female   DOB: Apr 23, 1931, 79 y.o.   MRN: [id number] Chief Complaint  Patient presents with  . Atrial Fibrillation  . Anticoagulation    HPI Long term (current) use of anticoagulants Inr is 1.9 and is taking coumadin 2.5 mg daily   ATRIAL FIBRILLATION Is presently stable is taking coumadin 2.5 mg daily is taking cardizem 120 mg daily for rate control along with coreg 6.25 mg twice daily    Past Medical History  Diagnosis Date  . Morbid obesity   . Arthritis   . Allergy history unknown   . CHF (congestive heart failure)   . Pulmonary hypertension   . GERD (gastroesophageal reflux disease)   . COPD (chronic obstructive pulmonary disease)   . OA (osteoarthritis)   . Solitary kidney, congenital   . Hypertension     Poorly controlled, DR. Dasanayaka  . Atrial fibrillation    Past Surgical History  Procedure Laterality Date  . Total abdominal hysterectomy    . Total knee arthroplasty      Right knee  . Ankle fracture surgery      Right ankle repair  . Insertion of dialysis catheter  12/16/2012    Procedure: INSERTION OF DIALYSIS CATHETER;  Surgeon: Nada Libman, MD;  Location: [facility] OR;  Service: Vascular;  Laterality: Left;  . Av fistula placement  12/16/2012    Procedure: ARTERIOVENOUS (AV) FISTULA CREATION;  Surgeon: Nada Libman, MD;  Location: [facility] OR;  Service: Vascular;  Laterality: Left;  . Insertion of dialysis catheter  12/18/2012    Procedure: INSERTION OF DIALYSIS CATHETER;  Surgeon: Nada Libman, MD;  Location: [facility] OR;  Service: Vascular;  Laterality: Right;  right IJ hemodialysis catheter insertion. removal of left IJ hemodialysis catheter  . Av fistula placement Left 02/15/2013    Procedure: ARTERIOVENOUS (AV) FISTULA CREATION;  Surgeon: Sherren Kerns, MD;  Location: [facility] OR;  Service: Vascular;  Laterality: Left;  . Ligation of arteriovenous  fistula Left 02/15/2013    Procedure: LIGATION OF ARTERIOVENOUS  FISTULA;   Surgeon: Sherren Kerns, MD;  Location: [facility] OR;  Service: Vascular;  Laterality: Left;  Current outpatient prescriptions:acetaminophen (TYLENOL) 325 MG tablet, Take 650 mg by mouth every 4 (four) hours as needed for pain or fever., Disp: , Rfl: ;  bisacodyl (DULCOLAX) 10 MG suppository, Place 10 mg rectally every 12 (twelve) hours as needed for constipation., Disp: , Rfl:  calcium carbonate, dosed in mg elemental calcium, 1250 MG/5ML, Take 5 mLs (500 mg of elemental calcium total) by mouth every 6 (six) hours as needed., Disp: 450 mL, Rfl: ;  camphor-menthol (SARNA) lotion, Apply 1 application topically every 8 (eight) hours as needed for itching., Disp: 222 mL, Rfl: ;  carvedilol (COREG) 6.25 MG tablet, Only take on nondialysis days. Tue, Thurs, Sat, Sun, Disp: 32 tablet, Rfl: 3 darbepoetin (ARANESP) 150 MCG/0.3ML SOLN, Inject 0.3 mLs (150 mcg total) into the vein every Tuesday with hemodialysis., Disp: 1.68 mL, Rfl: ;  diltiazem (CARDIZEM CD) 120 MG 24 hr capsule, Take 1 capsule (120 mg total) by mouth at bedtime., Disp: , Rfl: ;  docusate sodium (ENEMEEZ) 283 MG enema, Place 1 enema (283 mg total) rectally as needed., Disp: 30 each, Rfl:  lidocaine (XYLOCAINE) 1 % SOLN injection, Inject 5 mLs into the skin as needed (topical anesthesia for hemodialysis ifGEBAUERS is ineffective.)., Disp: , Rfl: ;  lidocaine-prilocaine (EMLA) cream, Apply 1 application topically as needed (topical anesthesia for  hemodialysis if Gebauers and Lidocaine injection are ineffective.)., Disp: 30 g, Rfl: ;  multivitamin (RENA-VIT) TABS tablet, Take 1 tablet by mouth at bedtime., Disp: , Rfl:  ondansetron (ZOFRAN) 4 MG tablet, Take 1 tablet (4 mg total) by mouth every 6 (six) hours as needed for nausea., Disp: 20 tablet, Rfl: ;  oxyCODONE (ROXICODONE) 5 MG immediate release tablet, Take 1 tablet (5 mg total) by mouth every 4 (four) hours as needed for pain., Disp: 30 tablet, Rfl: 0;  pentafluoroprop-tetrafluoroeth (GEBAUERS) AERO,  Apply 1 application topically as needed (topical anesthesia for hemodialysis)., Disp: , Rfl:  sevelamer (RENAGEL) 800 MG tablet, Take 800 mg by mouth 3 (three) times daily with meals., Disp: , Rfl: ;  warfarin (COUMADIN) 2 MG tablet, Take 2.5 mg by mouth daily. , Disp: , Rfl: ;  Casanthranol-Docusate Sodium 30-100 MG CAPS, Take 1 capsule by mouth daily.  , Disp: , Rfl: ;  HYDROcodone-acetaminophen (NORCO/VICODIN) 5-325 MG per tablet, Take 1 tablet by mouth every 4 (four) hours as needed for pain., Disp: , Rfl:  polyethylene glycol (MIRALAX / GLYCOLAX) packet, Take 17 g by mouth daily., Disp: , Rfl: ;  [DISCONTINUED] metolazone (ZAROXOLYN) 2.5 MG tablet, Take once a week. Or as directed by physician, Disp: 10 tablet, Rfl: 6  Review of Systems  Constitutional: Negative for appetite change and fatigue.  Respiratory: Negative for cough, shortness of breath and wheezing.   Cardiovascular: Negative for chest pain and palpitations.  Gastrointestinal: Negative for abdominal pain and constipation.  Musculoskeletal: Negative for arthralgias.  Skin: Negative.   Psychiatric/Behavioral: Negative.    Filed Vitals:   03/14/13 2038  BP: 125/65  Pulse: 60  Weight: 170 lb (77.111 kg)      Objective:   Physical Exam  Constitutional: She is oriented to person, place, and time. She appears well-developed and well-nourished.  Neck: Neck supple.  Cardiovascular: Normal rate, regular rhythm and intact distal pulses.   Pulmonary/Chest: Effort normal and breath sounds normal.  Abdominal: Soft. Bowel sounds are normal.  Musculoskeletal: Normal range of motion. She exhibits no edema.  Neurological: She is alert and oriented to person, place, and time.  Skin: Skin is warm and dry.  Psychiatric: She has a normal mood and affect.   Lab reviewed 01-27-13: wbc 6.1; hgb 13.0; hct 40.4; mcv 89.4; plt 165; glucose 83; bun 35; creat 5.04; k+ 4.8 Na++ 140; alk phos 122; ast 20 alt 9; albumin 4.0     Assessment:      Afib; long term anticoagulation therapy     Plan:    will continue coumadin 2.5 mg daily will check inr in one week and will continue to monitor her status

## 2013-03-14 NOTE — Progress Notes (Signed)
Patient ID: Desiree Pierce, female   DOB: December 24, 1930, 79 y.o.   MRN: [id number]           PROGRESS NOTE  DATE:  03/10/2013   FACILITY: [facility]   LEVEL OF CARE: SNF  Acute Visit  CHIEF COMPLAINT:  Manage anticoagulation.    HISTORY OF PRESENT ILLNESS:  Anticoagulation.  On 03/09/2013, INR was 4.3.  Patient denies any bleeding.    Atrial fibrillation.  Patient is tolerating her medications without any  side effects.  She denies chest pain, shortness of breath or palpitations.    REVIEW OF SYSTEMS: GENERAL: no change in appetite, no fatigue, no weight changes, no fever, chills or weakness RESPIRATORY: no cough, SOB, DOE,, wheezing, hemoptysis CARDIAC:  chronic lower extremity swelling, no chest pain or palpitations  PHYSICAL EXAMINATION GENERAL: moderately obese, no acute distress RESPIRATORY: breathing is even & unlabored, BS CTAB CARDIAC: RRR, no murmur,no extra heart sounds EDEMA/VARICOSITIES:  +2 bilateral lower extremity edema ARTERIAL:  pedal pulses nonpalpable  GI: abdomen soft, normal BS, no masses, no tenderness, no hepatomegaly, no splenomegaly PSYCHIATRIC: the patient is alert & oriented to person, affect & behavior appropriate  ASSESSMENT/PLAN:  Atrial fibrillation 427.31.  Rate controlled.    V58.61.  INR is supratherapeutic and unstable.  Coumadin put on hold.  Recheck INR on 03/11/2013.    CPT CODE: 84696.

## 2013-03-14 NOTE — Assessment & Plan Note (Signed)
Inr is 1.9 and is taking coumadin 2.5 mg daily

## 2013-03-14 NOTE — Assessment & Plan Note (Signed)
Is presently stable is taking coumadin 2.5 mg daily is taking cardizem 120 mg daily for rate control along with coreg 6.25 mg twice daily

## 2013-03-16 ENCOUNTER — Encounter: Payer: Self-pay | Admitting: Vascular Surgery

## 2013-03-17 ENCOUNTER — Encounter: Payer: Self-pay | Admitting: Vascular Surgery

## 2013-03-17 ENCOUNTER — Ambulatory Visit (INDEPENDENT_AMBULATORY_CARE_PROVIDER_SITE_OTHER): Payer: Medicare Other | Admitting: Vascular Surgery

## 2013-03-17 VITALS — BP 118/74 | HR 95 | Resp 18 | Ht 62.0 in | Wt 170.0 lb

## 2013-03-17 DIAGNOSIS — N186 End stage renal disease: Secondary | ICD-10-CM

## 2013-03-17 NOTE — Progress Notes (Signed)
Patient is a 79 year old female who returns for followup today. She previous left radiocephalic AV fistula. This never developed well. She recently had ligation of the left radiocephalic AV fistula followed by left basilic vein transposition fistula. She denies any numbness or tingling in her hand. She is currently dialyzing via a right-sided catheter. Her operation was one month ago.  Physical exam:  Filed Vitals:   03/17/13 1303  BP: 118/74  Pulse: 95  Resp: 18  Height: 5\' 2"  (1.575 m)  Weight: 170 lb (77.111 kg)  SpO2: 79%   Left upper extremity: The left forearm incision is slowly healing but overall is healthy. There has been some slight skin separation. The left upper arm incisions are all well healed. The fistula is easily palpable with an easily palpable thrill.  Assessment: Maturing left arm basilic vein transposition fistula  Plan: Followup as needed fistula should be ready to cannulate in 2 months  Fabienne Bruns, MD [facility] Office: [phone] Pager: [phone]

## 2013-04-21 NOTE — Addendum Note (Signed)
Addendum created 04/21/13 0930 by Raiford Simmonds, MD   Modules edited: Anesthesia Attestations, Anesthesia Responsible Staff

## 2013-04-28 ENCOUNTER — Non-Acute Institutional Stay (SKILLED_NURSING_FACILITY): Payer: Medicare Other | Admitting: Internal Medicine

## 2013-04-28 DIAGNOSIS — Z7901 Long term (current) use of anticoagulants: Secondary | ICD-10-CM

## 2013-04-28 DIAGNOSIS — R21 Rash and other nonspecific skin eruption: Secondary | ICD-10-CM

## 2013-04-28 DIAGNOSIS — I4891 Unspecified atrial fibrillation: Secondary | ICD-10-CM

## 2013-04-28 DIAGNOSIS — M069 Rheumatoid arthritis, unspecified: Secondary | ICD-10-CM

## 2013-04-29 ENCOUNTER — Other Ambulatory Visit: Payer: Self-pay | Admitting: *Deleted

## 2013-04-29 MED ORDER — HYDROCODONE-ACETAMINOPHEN 5-300 MG PO TABS: ORAL_TABLET | ORAL | Status: DC

## 2013-04-29 MED ORDER — OXYCODONE HCL 5 MG PO TABS: ORAL_TABLET | ORAL | Status: DC

## 2013-05-03 ENCOUNTER — Other Ambulatory Visit: Payer: Self-pay | Admitting: Geriatric Medicine

## 2013-05-03 MED ORDER — OXYCODONE HCL 5 MG PO TABS: ORAL_TABLET | ORAL | Status: DC

## 2013-05-03 MED ORDER — HYDROCODONE-ACETAMINOPHEN 5-300 MG PO TABS: ORAL_TABLET | ORAL | Status: DC

## 2013-05-09 ENCOUNTER — Non-Acute Institutional Stay (SKILLED_NURSING_FACILITY): Payer: Medicare Other | Admitting: Adult Health

## 2013-05-09 ENCOUNTER — Encounter: Payer: Self-pay | Admitting: Adult Health

## 2013-05-09 DIAGNOSIS — N186 End stage renal disease: Secondary | ICD-10-CM

## 2013-05-09 DIAGNOSIS — Z7901 Long term (current) use of anticoagulants: Secondary | ICD-10-CM

## 2013-05-09 DIAGNOSIS — K5900 Constipation, unspecified: Secondary | ICD-10-CM

## 2013-05-09 DIAGNOSIS — I10 Essential (primary) hypertension: Secondary | ICD-10-CM

## 2013-05-12 ENCOUNTER — Non-Acute Institutional Stay (SKILLED_NURSING_FACILITY): Payer: Medicare Other | Admitting: Internal Medicine

## 2013-05-19 NOTE — Progress Notes (Signed)
Patient ID: Desiree Pierce, female   DOB: 1931/11/25, 79 y.o.   MRN: [id number]        PROGRESS NOTE  DATE: 04/28/2013  FACILITY:  [facility]  LEVEL OF CARE: SNF (31)  Acute Visit  CHIEF COMPLAINT:  Manage anticoagulation and rash.    HISTORY OF PRESENT ILLNESS: I was requested by the staff to assess the patient regarding above problem(s):  ATRIAL FIBRILLATION: the patients atrial fibrillation remains stable.  The patient denies DOE, tachycardia, orthopnea, transient neurological sx, pedal edema, palpitations, & PNDs.  No complications noted from the medications currently being used.   ANTICOAGULATION: Patient is currently on coumadin and denies any problems.  He does not report any bleeding. INR on 04/25/2013 was 2.6.    RASH:  I was requested by the patient's daughter to assess new rash on patient's under-breasts bilaterally and bilateral groin, abdominal folds.  Per daughter, patient has a longstanding skin rash but it has gotten worse.  Patient admits to some itching.   PAST MEDICAL HISTORY : Reviewed.  No changes.  CURRENT MEDICATIONS: Reviewed per [facility]  REVIEW OF SYSTEMS:  GENERAL: no change in appetite, no fatigue, no weight changes, no fever, chills or weakness RESPIRATORY: no cough, SOB, DOE,, wheezing, hemoptysis CARDIAC: no chest pain, edema or palpitations GI: no abdominal pain, diarrhea, constipation, heart burn, nausea or vomiting  PHYSICAL EXAMINATION  GENERAL: no acute distress, moderately obese body habitus SKIN:  INSPECTION:  Bilateral under-breasts and bilateral abdominal folds have a large macular rash. There is no erythema or skin breakage.    EYES: conjunctivae normal, sclerae normal, normal eye lids NECK: supple, trachea midline, no neck masses, no thyroid tenderness, no thyromegaly LYMPHATICS: no LAN in the neck, no supraclavicular LAN RESPIRATORY: breathing is even & unlabored, BS CTAB CARDIAC: RRR, no murmur,no extra heart  sounds EDEMA/VARICOSITIES:  +1 bilateral lower extremity edema  ARTERIAL:  pedal pulses nonpalpable  GI: abdomen soft, normal BS, no masses, no tenderness, no hepatomegaly, no splenomegaly PSYCHIATRIC: the patient is alert & oriented to person, affect & behavior appropriate  ASSESSMENT/PLAN:  Atrial fibrillation.  Rate controlled.   V58.61.  INR is therapeutic and stable.  Continue Coumadin 2.5 mg q.d.  Check INR on 05/08/2013.    Rash.  Uncontrolled, worsening problem.  We will obtain a Dermatology consult at this point.    Rheumatoid arthritis.  Daughter reports that patient only needs tramadol and would like other pain medications discontinued since patient does not feel well on them.    I spoke with patient's daughter and discussed about treatment plan.   Total patient care time:  Greater than 25 minutes.    CPT CODE: 40981

## 2013-05-31 ENCOUNTER — Non-Acute Institutional Stay (SKILLED_NURSING_FACILITY): Payer: Medicare Other | Admitting: Internal Medicine

## 2013-05-31 DIAGNOSIS — I509 Heart failure, unspecified: Secondary | ICD-10-CM

## 2013-05-31 DIAGNOSIS — K5900 Constipation, unspecified: Secondary | ICD-10-CM | POA: Insufficient documentation

## 2013-05-31 DIAGNOSIS — I4891 Unspecified atrial fibrillation: Secondary | ICD-10-CM

## 2013-05-31 DIAGNOSIS — Z7901 Long term (current) use of anticoagulants: Secondary | ICD-10-CM

## 2013-05-31 NOTE — Progress Notes (Signed)
PROGRESS NOTE  DATE: 05/31/2013  FACILITY: Nursing Home Location: [facility]  LEVEL OF CARE: SNF (31)  Routine Visit  CHIEF COMPLAINT:  Manage atrial fibrillation, anti-coagulation, CHF and constipation  HISTORY OF PRESENT ILLNESS:  REASSESSMENT OF ONGOING PROBLEM(S):  ATRIAL FIBRILLATION: the patients atrial fibrillation remains stable.  The patient denies DOE, tachycardia, orthopnea, transient neurological sx, pedal edema, palpitations, & PNDs.  No complications noted from the medications currently being used.  ANTICOAGULATION: Patient is currently on coumadin and does not report any bleeding. INR on 6/9 is 2.5, on 6/2 INR 3.2.  CHF:The patient does not relate significant weight changes, denies sob, DOE, orthopnea, PNDs, pedal edema, palpitations or chest pain.  CHF remains stable.  No complications form the medications being used.  CONSTIPATION: The constipation remains stable. No complications from the medications presently being used. Patient denies ongoing constipation, abdominal pain, nausea or vomiting.  PAST MEDICAL HISTORY : Reviewed.  No changes.  CURRENT MEDICATIONS: Reviewed per [facility]  REVIEW OF SYSTEMS:  GENERAL: no change in appetite, no fatigue, no weight changes, no fever, chills or weakness RESPIRATORY: no cough, SOB, DOE, wheezing, hemoptysis CARDIAC: no chest pain, edema or palpitations GI: no abdominal pain, diarrhea, constipation, heart burn, nausea or vomiting  PHYSICAL EXAMINATION  VS:  T 97.1       P 89      RR 21      BP 127/76     POX %     WT (Lb) 169.4  GENERAL: no acute distress, obese body habitus EYES: conjunctivae normal, sclerae normal, normal eye lids NECK: supple, trachea midline, no neck masses, no thyroid tenderness, no thyromegaly LYMPHATICS: no LAN in the neck, no supraclavicular LAN RESPIRATORY: breathing is even & unlabored, BS CTAB CARDIAC: RRR, no murmur,no extra heart sounds, +1 bilateral lower  extremity edema GI: abdomen soft, normal BS, no masses, no tenderness, no hepatomegaly, no splenomegaly PSYCHIATRIC: the patient is alert & oriented to person, affect & behavior appropriate  LABS/RADIOLOGY:  2/14 CBC normal, BUN 35, creatinine 5.04, alkaline phosphatase 122 otherwise CMP normal  ASSESSMENT/PLAN:  Atrial fibrillation-rate controlled. Anticoagulation-INR is therapeutic and stable. Continue Coumadin 2 mg daily. Check INR in one week. CHF-well compensated. Constipation-well controlled. Left knee osteoarthritis-Lidoderm patch was started. End-stage renal disease-on hemodialysis.  CPT CODE: 19147

## 2013-06-07 DIAGNOSIS — M1710 Unilateral primary osteoarthritis, unspecified knee: Secondary | ICD-10-CM | POA: Insufficient documentation

## 2013-06-07 NOTE — Progress Notes (Signed)
Patient ID: Desiree Pierce, female   DOB: [birth year], 79 y.o.   MRN: [id number]        PROGRESS NOTE  DATE: 05/12/2013  FACILITY:  [facility]  LEVEL OF CARE: SNF (31)  Acute Visit  CHIEF COMPLAINT:  Manage left knee arthritis.    HISTORY OF PRESENT ILLNESS: I was requested by the staff to assess the patient regarding above problem(s):  ARTHRITIS: Patient's arthritis is uncontrolled.  The patient is complaining of uncontrolled left knee pain and would like a topical patch for it.  Denies ongoing stiffness, warmth & redness.  No complications reported from the medication(s) currently being used.  PAST MEDICAL HISTORY : Reviewed.  No changes.  CURRENT MEDICATIONS: Reviewed per [facility]  REVIEW OF SYSTEMS:  GENERAL: no change in appetite, no fatigue, no weight changes, no fever, chills or weakness RESPIRATORY: no cough, SOB, DOE,, wheezing, hemoptysis CARDIAC: no chest pain, edema or palpitations GI: no abdominal pain, diarrhea, constipation, heart burn, nausea or vomiting  PHYSICAL EXAMINATION  GENERAL: no acute distress, moderately obese body habitus NECK: supple, trachea midline, no neck masses, no thyroid tenderness, no thyromegaly RESPIRATORY: breathing is even & unlabored, BS CTAB CARDIAC: RRR, no murmur,no extra heart sounds EDEMA/VARICOSITIES:  +1 bilateral lower extremity edema  ARTERIAL:  pedal pulses nonpalpable  GI: abdomen soft, normal BS, no masses, no tenderness, no hepatomegaly, no splenomegaly PSYCHIATRIC: the patient is alert & oriented to person, affect & behavior appropriate MUSCULOSKELETAL:   EXTREMITIES:   LEFT LOWER EXTREMITY: left knee is edematous, tender to palpation, range of motion exacerbates pain  ASSESSMENT/PLAN:  Left knee osteoarthritis.  Uncontrolled problem.  Start Lidoderm patch 5% for 12 hours a day.    CPT CODE: 21308

## 2013-06-14 ENCOUNTER — Non-Acute Institutional Stay (SKILLED_NURSING_FACILITY): Payer: Medicare Other | Admitting: Internal Medicine

## 2013-06-14 DIAGNOSIS — I4891 Unspecified atrial fibrillation: Secondary | ICD-10-CM

## 2013-06-14 DIAGNOSIS — Z7901 Long term (current) use of anticoagulants: Secondary | ICD-10-CM

## 2013-06-27 ENCOUNTER — Non-Acute Institutional Stay (SKILLED_NURSING_FACILITY): Payer: Medicare Other | Admitting: Adult Health

## 2013-06-27 DIAGNOSIS — K5900 Constipation, unspecified: Secondary | ICD-10-CM

## 2013-06-27 DIAGNOSIS — Z7901 Long term (current) use of anticoagulants: Secondary | ICD-10-CM

## 2013-06-27 DIAGNOSIS — I4891 Unspecified atrial fibrillation: Secondary | ICD-10-CM

## 2013-06-27 DIAGNOSIS — N186 End stage renal disease: Secondary | ICD-10-CM

## 2013-06-27 DIAGNOSIS — I10 Essential (primary) hypertension: Secondary | ICD-10-CM

## 2013-06-28 ENCOUNTER — Non-Acute Institutional Stay (SKILLED_NURSING_FACILITY): Payer: Medicare Other | Admitting: Internal Medicine

## 2013-06-28 DIAGNOSIS — I4891 Unspecified atrial fibrillation: Secondary | ICD-10-CM

## 2013-06-28 DIAGNOSIS — Z7901 Long term (current) use of anticoagulants: Secondary | ICD-10-CM

## 2013-06-28 NOTE — Progress Notes (Signed)
PROGRESS NOTE  DATE: 06/28/2013  FACILITY:  [facility]  LEVEL OF CARE: SNF (31)  Acute Visit  CHIEF COMPLAINT:  Manage anticoagulation  HISTORY OF PRESENT ILLNESS: I was requested by the staff to assess the patient regarding above problem(s):  ATRIAL FIBRILLATION: the patients atrial fibrillation remains stable.  The patient denies DOE, tachycardia, orthopnea, transient neurological sx, pedal edema, palpitations, & PNDs.  No complications noted from the medications currently being used.  ANTICOAGULATION: Patient is currently on coumadin and does not report any bleeding. INR on 6/30 2.7, & on 6/23 1.7.  PAST MEDICAL HISTORY : Reviewed.  No changes.  CURRENT MEDICATIONS: Reviewed per [facility]  PHYSICAL EXAMINATION  GENERAL: no acute distress, normal body habitus RESPIRATORY: breathing is even & unlabored, BS CTAB CARDIAC: RRR, no murmur,no extra heart sounds, no edema  LABS/RADIOLOGY: See HPI  ASSESSMENT/PLAN:  Atrial fibrillation-rate controlled. V58.61-INR therapeutic & stable.  Cont. Coumadin 4mg  qd.  Recheck INR ordered in 1wk.  CPT CODE: 62130

## 2013-07-05 ENCOUNTER — Non-Acute Institutional Stay (SKILLED_NURSING_FACILITY): Payer: Medicare Other | Admitting: Internal Medicine

## 2013-07-05 DIAGNOSIS — I4891 Unspecified atrial fibrillation: Secondary | ICD-10-CM

## 2013-07-05 DIAGNOSIS — Z7901 Long term (current) use of anticoagulants: Secondary | ICD-10-CM

## 2013-07-05 NOTE — Progress Notes (Signed)
PROGRESS NOTE  DATE: 07/05/2013  FACILITY:  [facility]  LEVEL OF CARE: SNF (31)  Acute Visit  CHIEF COMPLAINT:  Manage anticoagulation  HISTORY OF PRESENT ILLNESS: I was requested by the staff to assess the patient regarding above problem(s):  ANTICOAGULATION: Patient is currently on coumadin and does not report any bleeding. INR on 7/14 is 3.9. On 7/7 INR is 4.4.  ATRIAL FIBRILLATION: the patients atrial fibrillation remains stable.  The patient denies DOE, tachycardia, orthopnea, transient neurological sx, pedal edema, palpitations, & PNDs.  No complications noted from the medications currently being used.  PAST MEDICAL HISTORY : Reviewed.  No changes.  CURRENT MEDICATIONS: Reviewed per [facility]  REVIEW OF SYSTEMS:  GENERAL: no change in appetite, no fatigue, no weight changes, no fever, chills or weakness RESPIRATORY: no cough, SOB, DOE,, wheezing, hemoptysis CARDIAC: no chest pain, edema or palpitations GI: no abdominal pain, diarrhea, constipation, heart burn, nausea or vomiting  PHYSICAL EXAMINATION  GENERAL: no acute distress, normal body habitus NECK: supple, trachea midline, no neck masses, no thyroid tenderness, no thyromegaly RESPIRATORY: breathing is even & unlabored, BS CTAB CARDIAC: RRR, no murmur,no extra heart sounds, no edema GI: abdomen soft, normal BS, no masses, no tenderness, no hepatomegaly, no splenomegaly PSYCHIATRIC: the patient is alert & oriented to person, affect & behavior appropriate  LABS/RADIOLOGY: See history of present illness  ASSESSMENT/PLAN:  Atrial fibrillation-rate controlled. V. 58.61-INR is supratherapeutic and unstable. Hold Coumadin. Recheck INR pending.  CPT CODE: 16109

## 2013-07-06 NOTE — Assessment & Plan Note (Signed)
Will continue coumadin 2.5 mg daily for inr of 3.2 and will check inr in 2 weeks.

## 2013-07-06 NOTE — Assessment & Plan Note (Signed)
Stable will continue miralax daily and senna s 3 tabs twice daily

## 2013-07-06 NOTE — Assessment & Plan Note (Signed)
Is on hemodialysis three days per week; will continue renavite daly 1200 cc fluid restriction; renagel 800 mg three times daily and will monitor status

## 2013-07-06 NOTE — Progress Notes (Signed)
Patient ID: Desiree Pierce, female   DOB: June 01, 1931, 79 y.o.   MRN: [id number]  [facility] No Known Allergies   Chief Complaint  Patient presents with  . Medical Managment of Chronic Issues    HPI: She is being seen for the management of her chronic illnesses. Overall she remains without change. There are no cncerns being voiced by the nursing staff or by th epatient her inr was performed today.   Past Medical History  Diagnosis Date  . Morbid obesity   . Arthritis   . Allergy history unknown   . CHF (congestive heart failure)   . Pulmonary hypertension   . GERD (gastroesophageal reflux disease)   . COPD (chronic obstructive pulmonary disease)   . OA (osteoarthritis)   . Solitary kidney, congenital   . Hypertension     Poorly controlled, DR. Dasanayaka  . Atrial fibrillation     Past Surgical History  Procedure Laterality Date  . Total abdominal hysterectomy    . Total knee arthroplasty      Right knee  . Ankle fracture surgery      Right ankle repair  . Insertion of dialysis catheter  12/16/2012    Procedure: INSERTION OF DIALYSIS CATHETER;  Surgeon: Nada Libman, MD;  Location: [facility] OR;  Service: Vascular;  Laterality: Left;  . Av fistula placement  12/16/2012    Procedure: ARTERIOVENOUS (AV) FISTULA CREATION;  Surgeon: Nada Libman, MD;  Location: [facility] OR;  Service: Vascular;  Laterality: Left;  . Insertion of dialysis catheter  12/18/2012    Procedure: INSERTION OF DIALYSIS CATHETER;  Surgeon: Nada Libman, MD;  Location: [facility] OR;  Service: Vascular;  Laterality: Right;  right IJ hemodialysis catheter insertion. removal of left IJ hemodialysis catheter  . Av fistula placement Left 02/15/2013    Procedure: ARTERIOVENOUS (AV) FISTULA CREATION;  Surgeon: Sherren Kerns, MD;  Location: [facility] OR;  Service: Vascular;  Laterality: Left;  . Ligation of arteriovenous  fistula Left 02/15/2013    Procedure: LIGATION OF ARTERIOVENOUS  FISTULA;  Surgeon: Sherren Kerns, MD;   Location: [facility] OR;  Service: Vascular;  Laterality: Left;    VITAL SIGNS BP 102/55  Pulse 91  Ht 5\' 2"  (1.575 m)  Wt 170 lb 1.6 oz (77.157 kg)  BMI 31.1 kg/m2   Patient's Medications  New Prescriptions   No medications on file  Previous Medications   ACETAMINOPHEN (TYLENOL) 325 MG TABLET    Take 650 mg by mouth every 4 (four) hours as needed for pain or fever.   BISACODYL (DULCOLAX) 10 MG SUPPOSITORY    Place 10 mg rectally every 12 (twelve) hours as needed for constipation.   CARVEDILOL (COREG) 6.25 MG TABLET    Only take on nondialysis days. Tue, Thurs, Sat, Sun   DILTIAZEM (CARDIZEM CD) 120 MG 24 HR CAPSULE    Take 1 capsule (120 mg total) by mouth at bedtime.   MULTIVITAMIN (RENA-VIT) TABS TABLET    Take 1 tablet by mouth at bedtime.   ONDANSETRON (ZOFRAN) 4 MG TABLET    Take 1 tablet (4 mg total) by mouth every 6 (six) hours as needed for nausea.   POLYETHYLENE GLYCOL (MIRALAX / GLYCOLAX) PACKET    Take 17 g by mouth daily.   SENNOSIDES-DOCUSATE SODIUM (SENOKOT-S) 8.6-50 MG TABLET    Take 3 tablets by mouth 2 (two) times daily.   SEVELAMER (RENAGEL) 800 MG TABLET    Take 800 mg by mouth 3 (three) times daily with meals.  TRAMADOL (ULTRAM) 50 MG TABLET    Take 50 mg by mouth every 12 (twelve) hours as needed for pain.   WARFARIN (COUMADIN) 2 MG TABLET    Take 2.5 mg by mouth daily.   Modified Medications   No medications on file  Discontinued Medications   CALCIUM CARBONATE, DOSED IN MG ELEMENTAL CALCIUM, 1250 MG/5ML    Take 5 mLs (500 mg of elemental calcium total) by mouth every 6 (six) hours as needed.   CAMPHOR-MENTHOL (SARNA) LOTION    Apply 1 application topically every 8 (eight) hours as needed for itching.   CASANTHRANOL-DOCUSATE SODIUM 30-100 MG CAPS    Take 1 capsule by mouth daily.     DARBEPOETIN (ARANESP) 150 MCG/0.3ML SOLN    Inject 0.3 mLs (150 mcg total) into the vein every Tuesday with hemodialysis.   DOCUSATE SODIUM (ENEMEEZ) 283 MG ENEMA    Place 1 enema (283 mg  total) rectally as needed.   HYDROCODONE-ACETAMINOPHEN (NORCO/VICODIN) 5-325 MG PER TABLET    Take 1 tablet by mouth every 4 (four) hours as needed for pain.   HYDROCODONE-ACETAMINOPHEN 5-300 MG TABS    Take one tablet every 4 hours as needed   LIDOCAINE (XYLOCAINE) 1 % SOLN INJECTION    Inject 5 mLs into the skin as needed (topical anesthesia for hemodialysis ifGEBAUERS is ineffective.).   LIDOCAINE-PRILOCAINE (EMLA) CREAM    Apply 1 application topically as needed (topical anesthesia for hemodialysis if Gebauers and Lidocaine injection are ineffective.).   OXYCODONE (ROXICODONE) 5 MG IMMEDIATE RELEASE TABLET    Take one tablet every 4 hours as needed   PENTAFLUOROPROP-TETRAFLUOROETH (GEBAUERS) AERO    Apply 1 application topically as needed (topical anesthesia for hemodialysis).    SIGNIFICANT DIAGNOSTIC EXAMS  01-13-13: left foot x-ray: mild osteoporosis; mild degenerative disease; no acute fracture or dislocation   Lab reviewed 01-27-13: wbc 6.1; hgb 13.0; hct 40.4; mcv 89.4; plt 165; glucose 83; bun 35; creat 5.04; k+ 4.8 Na++ 140; alk phos 122; ast 20 alt 9; albumin 4.0    Review of Systems  Constitutional: Negative for malaise/fatigue.  Respiratory: Negative for cough and shortness of breath.   Cardiovascular: Negative for chest pain and leg swelling.  Gastrointestinal: Negative for heartburn, abdominal pain and constipation.  Musculoskeletal: Negative for myalgias and joint pain.  Skin: Negative.   Neurological: Negative for headaches.  Psychiatric/Behavioral: Negative for depression. The patient does not have insomnia.      Physical Exam  Constitutional: She appears well-developed and well-nourished.  overweight  Neck: Neck supple. No JVD present. No thyromegaly present.  Cardiovascular: Normal rate and regular rhythm.   Respiratory: Effort normal and breath sounds normal. No respiratory distress.  GI: Soft. Bowel sounds are normal. She exhibits no distension. There is no  tenderness.  Musculoskeletal: She exhibits no edema.  Uses wheelchair is able to move extremities  Neurological: She is alert.  Skin: Skin is warm and dry.       ASSESSMENT/ PLAN:  HYPERTENSION Is stable will continue coreg 6.25 mg on non-dialysis days; cardizem cd 120 mg daily and will monitor  End stage renal disease Is on hemodialysis three days per week; will continue renavite daly 1200 cc fluid restriction; renagel 800 mg three times daily and will monitor status  Long term (current) use of anticoagulants Will continue coumadin 2.5 mg daily for inr of 3.2 and will check inr in 2 weeks.   Unspecified constipation Stable will continue miralax daily and senna s 3 tabs twice daily

## 2013-07-06 NOTE — Assessment & Plan Note (Signed)
Is stable will continue coreg 6.25 mg on non-dialysis days; cardizem cd 120 mg daily and will monitor

## 2013-07-11 NOTE — Progress Notes (Signed)
Patient ID: Desiree Pierce, female   DOB: 03-31-1931, 79 y.o.   MRN: [id number]        PROGRESS NOTE  DATE: 06/14/2013  FACILITY:  [facility]  LEVEL OF CARE: SNF (31)  Acute Visit  CHIEF COMPLAINT:  Manage anticoagulation.    HISTORY OF PRESENT ILLNESS: I was requested by the staff to assess the patient regarding above problem(s):  ATRIAL FIBRILLATION: the patients atrial fibrillation remains stable.  The patient denies DOE, tachycardia, orthopnea, transient neurological sx, pedal edema, palpitations, & PNDs.  No complications noted from the medications currently being used.   ANTICOAGULATION: Patient is currently on coumadin and does not report any bleeding. INR on 06/09/2013 was 1.6.  On 06/06/2013, INR was 1.4.  Patient is on Coumadin 2.5 mg q.d.    PAST MEDICAL HISTORY : Reviewed.  No changes.  CURRENT MEDICATIONS: Reviewed per [facility]  REVIEW OF SYSTEMS:  GENERAL: no change in appetite, no fatigue, no weight changes, no fever, chills or weakness RESPIRATORY: no cough, SOB, DOE,, wheezing, hemoptysis CARDIAC: no chest pain, edema or palpitations GI: no abdominal pain, diarrhea, constipation, heart burn, nausea or vomiting  PHYSICAL EXAMINATION  GENERAL: no acute distress, normal body habitus NECK: supple, trachea midline, no neck masses, no thyroid tenderness, no thyromegaly RESPIRATORY: breathing is even & unlabored, BS CTAB CARDIAC: RRR, no murmur,no extra heart sounds EDEMA/VARICOSITIES:  +1 bilateral lower extremity edema  ARTERIAL:  pedal pulses nonpalpable  GI: abdomen soft, normal BS, no masses, no tenderness, no hepatomegaly, no splenomegaly PSYCHIATRIC: the patient is alert & oriented to person, affect & behavior appropriate  ASSESSMENT/PLAN:  Atrial fibrillation.  Rate controlled.    V58.61.  INR is unstable and subtherapeutic.  Coumadin increased to 3 mg q.d.  Recheck pending.    CPT CODE: 08657

## 2013-07-19 ENCOUNTER — Ambulatory Visit (HOSPITAL_COMMUNITY)
Admission: RE | Admit: 2013-07-19 | Discharge: 2013-07-19 | Disposition: A | Discharge status: Home / Self Care | Payer: Medicare Other | Source: Ambulatory Visit | Attending: Internal Medicine | Admitting: Internal Medicine

## 2013-07-19 ENCOUNTER — Encounter (HOSPITAL_COMMUNITY): Payer: Self-pay

## 2013-07-19 VITALS — BP 110/68 | HR 96

## 2013-07-19 DIAGNOSIS — N184 Chronic kidney disease, stage 4 (severe): Secondary | ICD-10-CM | POA: Insufficient documentation

## 2013-07-19 DIAGNOSIS — Q602 Renal agenesis, unspecified: Secondary | ICD-10-CM | POA: Insufficient documentation

## 2013-07-19 DIAGNOSIS — I129 Hypertensive chronic kidney disease with stage 1 through stage 4 chronic kidney disease, or unspecified chronic kidney disease: Secondary | ICD-10-CM | POA: Insufficient documentation

## 2013-07-19 DIAGNOSIS — M1990 Unspecified osteoarthritis, unspecified site: Secondary | ICD-10-CM | POA: Insufficient documentation

## 2013-07-19 DIAGNOSIS — Z79899 Other long term (current) drug therapy: Secondary | ICD-10-CM | POA: Insufficient documentation

## 2013-07-19 DIAGNOSIS — K219 Gastro-esophageal reflux disease without esophagitis: Secondary | ICD-10-CM | POA: Insufficient documentation

## 2013-07-19 DIAGNOSIS — G4733 Obstructive sleep apnea (adult) (pediatric): Secondary | ICD-10-CM | POA: Insufficient documentation

## 2013-07-19 DIAGNOSIS — I5032 Chronic diastolic (congestive) heart failure: Secondary | ICD-10-CM

## 2013-07-19 DIAGNOSIS — J449 Chronic obstructive pulmonary disease, unspecified: Secondary | ICD-10-CM | POA: Insufficient documentation

## 2013-07-19 DIAGNOSIS — I4891 Unspecified atrial fibrillation: Secondary | ICD-10-CM | POA: Insufficient documentation

## 2013-07-19 DIAGNOSIS — Z7901 Long term (current) use of anticoagulants: Secondary | ICD-10-CM | POA: Insufficient documentation

## 2013-07-19 DIAGNOSIS — J4489 Other specified chronic obstructive pulmonary disease: Secondary | ICD-10-CM | POA: Insufficient documentation

## 2013-07-19 DIAGNOSIS — I509 Heart failure, unspecified: Secondary | ICD-10-CM | POA: Insufficient documentation

## 2013-07-19 DIAGNOSIS — M129 Arthropathy, unspecified: Secondary | ICD-10-CM | POA: Insufficient documentation

## 2013-07-19 DIAGNOSIS — Z993 Dependence on wheelchair: Secondary | ICD-10-CM | POA: Insufficient documentation

## 2013-07-19 NOTE — Assessment & Plan Note (Signed)
Volume status mildly elevated but managed with HD M-W-F. Continue current regimen. Follow up in 6 months.

## 2013-07-19 NOTE — Patient Instructions (Addendum)
The current medical regimen is effective;  continue present plan and medications.  Follow up in 6 months with Dr Gala Romney  You will receive a letter in the mail 2 months before you are due.  Please call us when you receive this letter to schedule your follow up appointment.

## 2013-07-19 NOTE — Progress Notes (Signed)
Patient ID: Desiree Pierce, female   DOB: 1931/10/09, 79 y.o.   MRN: [id number]  Nephrology: Dr Briant Cedar.   Weight Range   Baseline proBNP     HPI: Desiree Pierce is a 79 yo female with PMHx s/f chronic diastolic CHF, congenital solitary kidney, CKD (stage IV, baseline Cr 1.5-2.4), COPD + OSA + morbid obesity ->secondary pulmonary HTN, permanent atrial fibrillation, OSA, GERD.  11/22/12 ECHO EF 60% Moderate LVH Mild Mitral regurgitation. + moderateRV dysfunction   12/01/12: [facility]  RA = 21  RV = 80/11/19  PA = 88/34 (56)  PCW = 24 v = 35  Fick cardiac output/index = 4.9/2.5  PVR = 7.3 woods  FA sat = 96%  PA sat = 55%, 57%    Admitted 11/2012 with recurrent diastolic HF and RHF.  Renal failure progressed and she required HD.Developed thrombocytopenia with platelets down to 69K. Heparin stopped on 12/25 and HIT panel sent (pending); Switched to argatroban and PLTs back up to 85K on 12/26.  Discharge weight 204 pounds.  Discharged to [facility].   She returns for follow up. Unable weight on stand up scale. She is wheelchair bound. Difficulty standing. Overall she is feeling much better. Denies SOB/PND/Orthopnea. Requires HD Mon-Wed-Fri. She does have difficulty with hypotension. She remains at [facility].  She continues PT at SNF [facility]). Compliant with medications.     ROS: All systems negative except as listed in HPI, PMH and Problem List.  Past Medical History  Diagnosis Date  . Morbid obesity   . Arthritis   . Allergy history unknown   . CHF (congestive heart failure)   . Pulmonary hypertension   . GERD (gastroesophageal reflux disease)   . COPD (chronic obstructive pulmonary disease)   . OA (osteoarthritis)   . Solitary kidney, congenital   . Hypertension     Poorly controlled, DR. Dasanayaka  . Atrial fibrillation     Current Outpatient Prescriptions  Medication Sig Dispense Refill  . acetaminophen (TYLENOL) 325 MG tablet Take 650 mg by mouth every 4 (four) hours as  needed for pain or fever.      . bisacodyl (DULCOLAX) 10 MG suppository Place 10 mg rectally every 12 (twelve) hours as needed for constipation.      . carvedilol (COREG) 6.25 MG tablet Only take on nondialysis days. Tue, Thurs, Sat, Sun  32 tablet  3  . diltiazem (CARDIZEM CD) 120 MG 24 hr capsule Take 1 capsule (120 mg total) by mouth at bedtime.      . lidocaine (LIDODERM) 5 % Place 1 patch onto the skin every 12 (twelve) hours. Remove & Discard patch within 12 hours or as directed by MD      . multivitamin (RENA-VIT) TABS tablet Take 1 tablet by mouth at bedtime.      . ondansetron (ZOFRAN) 4 MG tablet Take 1 tablet (4 mg total) by mouth every 6 (six) hours as needed for nausea.  20 tablet    . polyethylene glycol (MIRALAX / GLYCOLAX) packet Take 17 g by mouth daily.      . sennosides-docusate sodium (SENOKOT-S) 8.6-50 MG tablet Take 3 tablets by mouth 2 (two) times daily.      . sevelamer (RENAGEL) 800 MG tablet Take 800 mg by mouth 3 (three) times daily with meals.      . simethicone (MYLICON) 80 MG chewable tablet Chew 80 mg by mouth every 6 (six) hours as needed for flatulence.      . traMADol Janean Sark)  50 MG tablet Take 50 mg by mouth every 12 (twelve) hours as needed for pain.      Marland Kitchen warfarin (COUMADIN) 3 MG tablet Take 3 mg by mouth daily.      . [DISCONTINUED] metolazone (ZAROXOLYN) 2.5 MG tablet Take once a week. Or as directed by physician  10 tablet  6   No current facility-administered medications for this encounter.     PHYSICAL EXAM: Filed Vitals:   07/19/13 1103  BP: 110/68  Pulse: 96  SpO2: 99%    Physical Exam:  General: No acute distress . Sitting in wheel chair  Neck: supple. JVP flat Carotids 2+ bilat; no bruits. No lymphadenopathy or thryomegaly appreciated.  Cor: PMI nondisplaced. Irregular rate & rhythm. No rubs, gallops or murmurs.  Lungs: diminished.  Abdomen: soft, nontender, nondistended. No hepatosplenomegaly. No bruits or masses. Bowel sounds present.   Extremities: no cyanosis, clubbing, rash, no edema. RA changes  Neuro: alert & orientedx3, cranial nerves grossly intact. moves all 4 extremities w/o difficulty. Affect pleasant. Intermittently confused     ASSESSMENT & PLAN:

## 2013-07-26 ENCOUNTER — Non-Acute Institutional Stay (SKILLED_NURSING_FACILITY): Payer: Medicare Other | Admitting: Internal Medicine

## 2013-07-26 DIAGNOSIS — I509 Heart failure, unspecified: Secondary | ICD-10-CM

## 2013-07-26 DIAGNOSIS — I4891 Unspecified atrial fibrillation: Secondary | ICD-10-CM

## 2013-07-26 DIAGNOSIS — K5900 Constipation, unspecified: Secondary | ICD-10-CM

## 2013-07-26 DIAGNOSIS — Z7901 Long term (current) use of anticoagulants: Secondary | ICD-10-CM

## 2013-07-28 NOTE — Progress Notes (Signed)
PROGRESS NOTE  DATE: 07-26-13  FACILITY: Nursing Home Location: [facility]  LEVEL OF CARE: SNF (31)  Routine Visit  CHIEF COMPLAINT:  Manage atrial fibrillation, anti-coagulation, CHF and constipation  HISTORY OF PRESENT ILLNESS:  REASSESSMENT OF ONGOING PROBLEM(S):  ATRIAL FIBRILLATION: the patients atrial fibrillation remains stable.  The patient denies DOE, tachycardia, orthopnea, transient neurological sx, pedal edema, palpitations, & PNDs.  No complications noted from the medications currently being used.  ANTICOAGULATION: Patient is currently on coumadin and does not report any bleeding. INR on 07/25/13  is 2.6, on 07-18-13 INR 2.2.  CHF:The patient does not relate significant weight changes, denies sob, DOE, orthopnea, PNDs, pedal edema, palpitations or chest pain.  CHF remains stable.  No complications form the medications being used.  CONSTIPATION: The constipation remains stable. No complications from the medications presently being used. Patient denies ongoing constipation, abdominal pain, nausea or vomiting.  PAST MEDICAL HISTORY : Reviewed.  No changes.  CURRENT MEDICATIONS: Reviewed per [facility]  REVIEW OF SYSTEMS:  GENERAL: no change in appetite, no fatigue, no weight changes, no fever, chills or weakness RESPIRATORY: no cough, SOB, DOE, wheezing, hemoptysis CARDIAC: no chest pain, edema or palpitations GI: no abdominal pain, diarrhea, constipation, heart burn, nausea or vomiting  PHYSICAL EXAMINATION  VS:  T 97     P 72     RR 18      BP 124/66     POX %     WT (Lb) 169.4  GENERAL: no acute distress, obese body habitus EYES: conjunctivae normal, sclerae normal, normal eye lids NECK: supple, trachea midline, no neck masses, no thyroid tenderness, no thyromegaly LYMPHATICS: no LAN in the neck, no supraclavicular LAN RESPIRATORY: breathing is even & unlabored, BS CTAB CARDIAC: RRR, no murmur,no extra heart sounds, +1 bilateral lower  extremity edema GI: abdomen soft, normal BS, no masses, no tenderness, no hepatomegaly, no splenomegaly PSYCHIATRIC: the patient is alert & oriented to person, affect & behavior appropriate  LABS/RADIOLOGY:  2/14 CBC normal, BUN 35, creatinine 5.04, alkaline phosphatase 122 otherwise CMP normal  ASSESSMENT/PLAN:  Atrial fibrillation-rate controlled. Anticoagulation-INR is therapeutic and stable. Continue Coumadin 3 mg daily. Check INR in 2 weeks. CHF-well compensated. Constipation-well controlled. Left knee osteoarthritis-on Lidoderm patch. End-stage renal disease-on hemodialysis. Check CBC and CMP  CPT CODE: 16109

## 2013-07-30 IMAGING — CR DG CHEST 1V PORT
1 series · 1 of 1 positions shown · non-contrast
Comparison: Portable exam 7782 hours compared to 12/06/2012

CLINICAL DATA: Left internal jugular hemodialysis catheter
placement

PORTABLE CHEST - 1 VIEW

[AP]
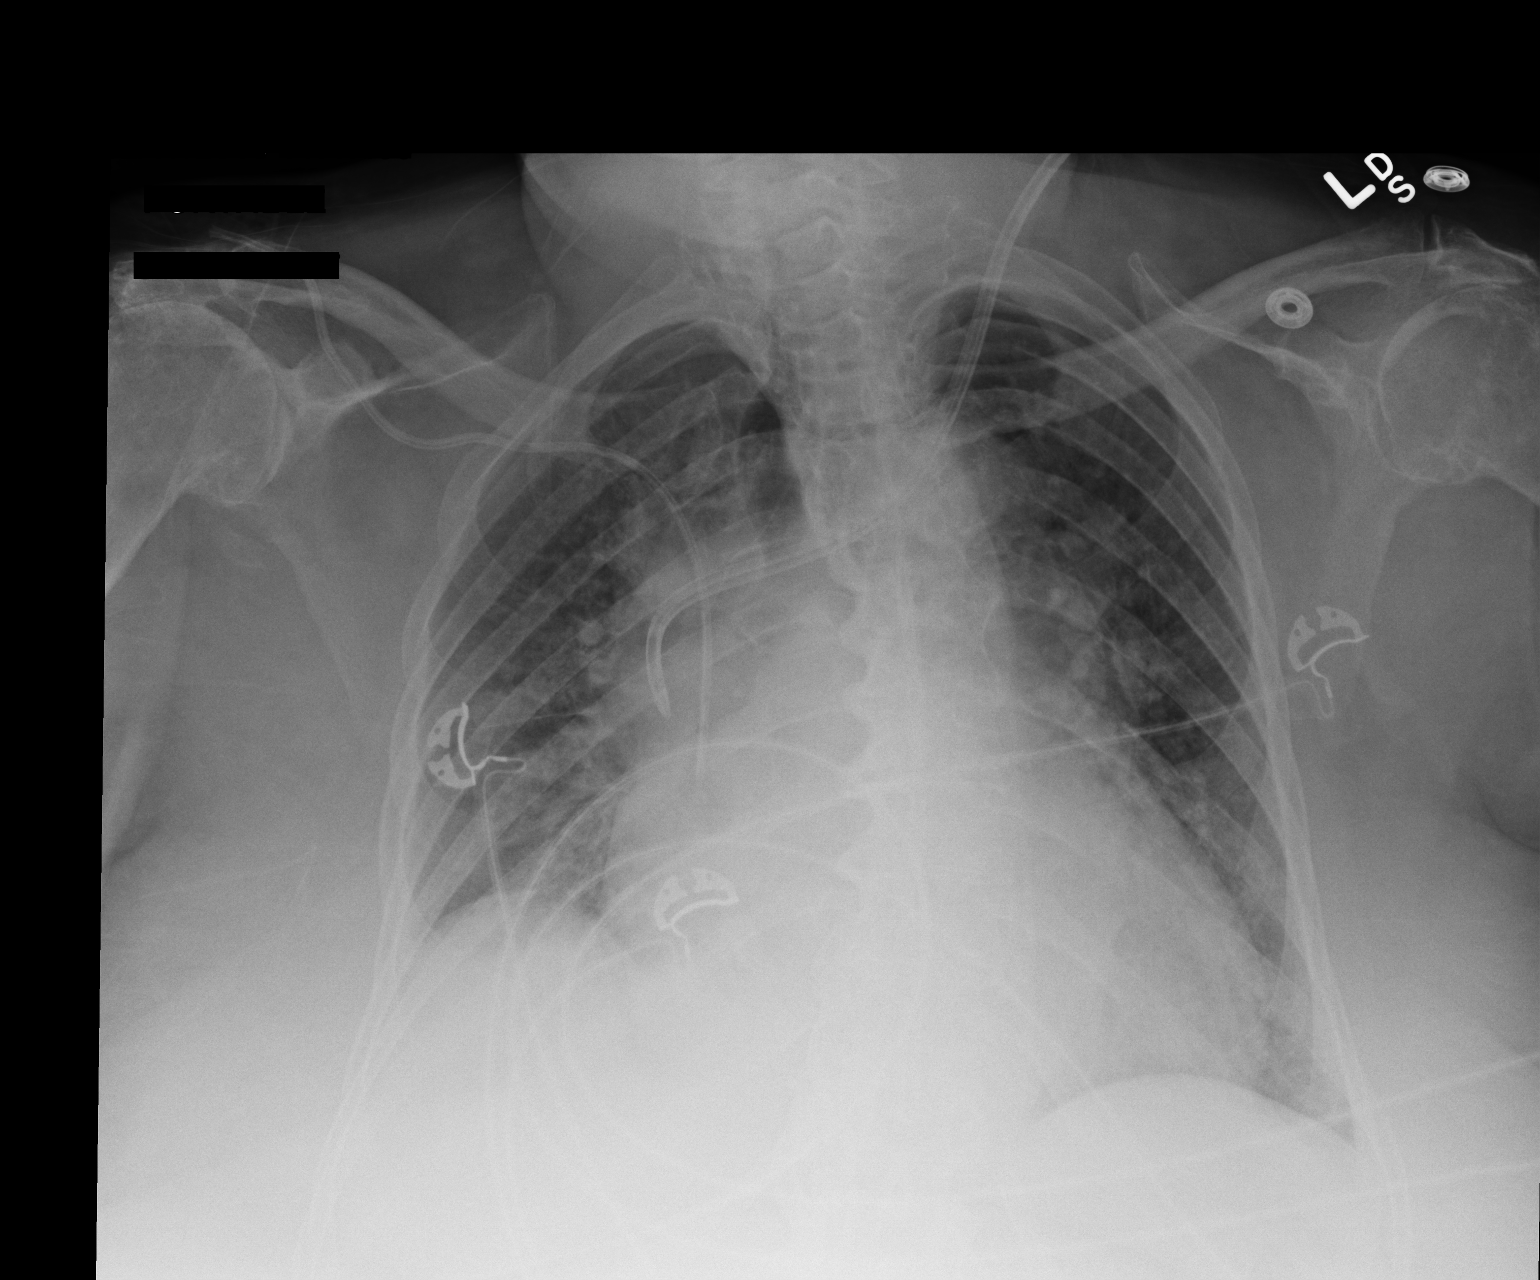

[1 of 1 positions shown; findings below may reference images not displayed]

FINDINGS: Right subclavian line stable tip projecting over SVC.
New left jugular dual-lumen central venous catheter tip projecting
over SVC.
Enlargement of cardiac silhouette with pulmonary vascular
congestion.
Rotated to the left.
Minimal atelectasis right base.
Question minimal perihilar edema.
No gross pleural effusion or pneumothorax.
IMPRESSION: No pneumothorax following left jugular line placement.
Enlargement of cardiac silhouette with pulmonary vascular
congestion and question minimal perihilar edema.

## 2013-08-10 IMAGING — CR DG CHEST 1V PORT
1 series · 1 of 1 positions shown · non-contrast
Comparison: 12/16/2012

CLINICAL DATA: Postop dialysis catheter placement.

PORTABLE CHEST - 1 VIEW

[AP]
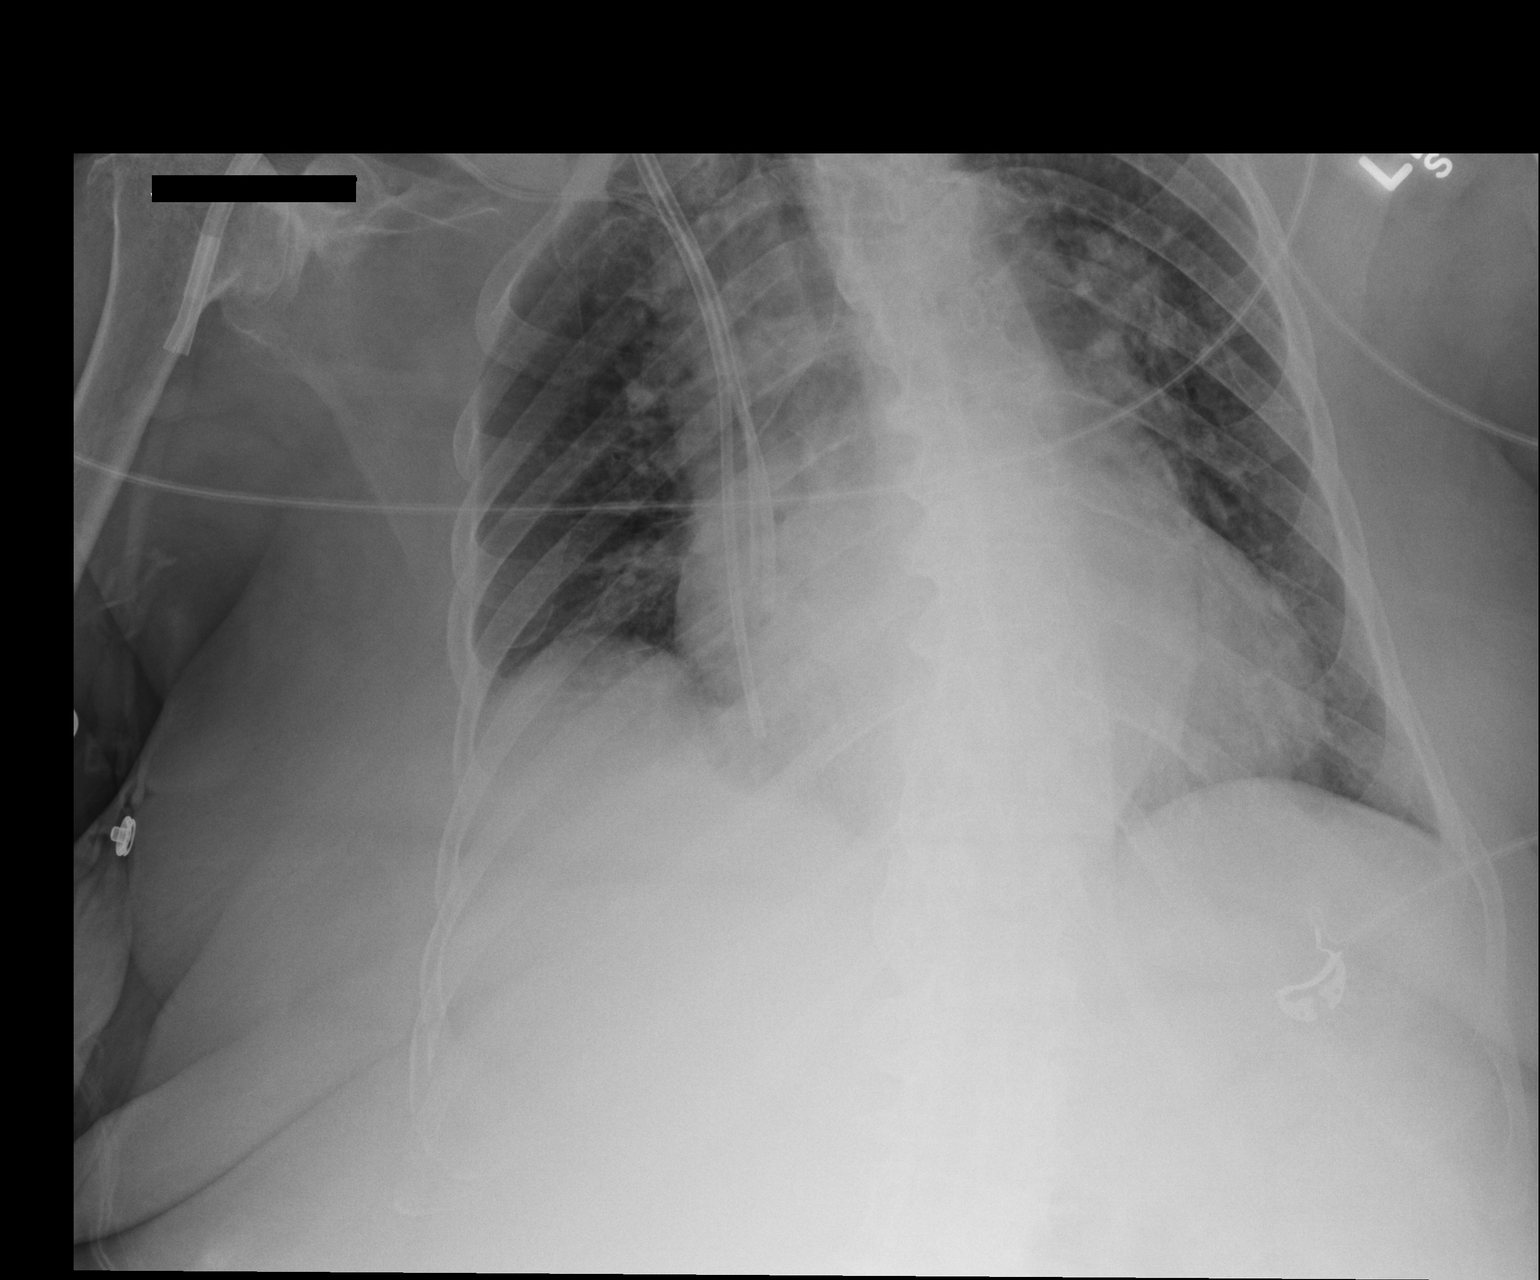

[1 of 1 positions shown; findings below may reference images not displayed]

FINDINGS: Interval removal of left dialysis catheter and placement
of right jugular dialysis catheter.  Tips are in the right atrium.
Right central line is unchanged.  Cardiomegaly with low lung
volumes.  No confluent airspace opacities or effusions.  No
pneumothorax.
IMPRESSION: Right dialysis catheter placement with tip in the right atrium.  No
pneumothorax.

## 2013-08-11 ENCOUNTER — Other Ambulatory Visit: Payer: Self-pay | Admitting: Otolaryngology

## 2013-08-11 DIAGNOSIS — J3800 Paralysis of vocal cords and larynx, unspecified: Secondary | ICD-10-CM

## 2013-08-16 ENCOUNTER — Other Ambulatory Visit: Payer: Self-pay | Admitting: Otolaryngology

## 2013-08-16 ENCOUNTER — Ambulatory Visit
Admission: RE | Admit: 2013-08-16 | Discharge: 2013-08-16 | Disposition: A | Discharge status: Home / Self Care | Payer: Medicare Other | Source: Ambulatory Visit | Attending: Otolaryngology | Admitting: Otolaryngology

## 2013-08-16 ENCOUNTER — Non-Acute Institutional Stay (SKILLED_NURSING_FACILITY): Payer: Medicare Other | Admitting: Internal Medicine

## 2013-08-16 DIAGNOSIS — J3800 Paralysis of vocal cords and larynx, unspecified: Secondary | ICD-10-CM

## 2013-08-16 DIAGNOSIS — H1045 Other chronic allergic conjunctivitis: Secondary | ICD-10-CM

## 2013-08-18 ENCOUNTER — Other Ambulatory Visit: Payer: Self-pay | Admitting: Otolaryngology

## 2013-08-18 DIAGNOSIS — E041 Nontoxic single thyroid nodule: Secondary | ICD-10-CM

## 2013-08-23 ENCOUNTER — Non-Acute Institutional Stay (SKILLED_NURSING_FACILITY): Payer: Medicare Other | Admitting: Internal Medicine

## 2013-08-23 DIAGNOSIS — I4891 Unspecified atrial fibrillation: Secondary | ICD-10-CM

## 2013-08-23 DIAGNOSIS — Z7901 Long term (current) use of anticoagulants: Secondary | ICD-10-CM

## 2013-08-31 ENCOUNTER — Non-Acute Institutional Stay (SKILLED_NURSING_FACILITY): Payer: Medicare Other | Admitting: Internal Medicine

## 2013-08-31 DIAGNOSIS — I509 Heart failure, unspecified: Secondary | ICD-10-CM

## 2013-08-31 DIAGNOSIS — K5900 Constipation, unspecified: Secondary | ICD-10-CM

## 2013-08-31 DIAGNOSIS — I4891 Unspecified atrial fibrillation: Secondary | ICD-10-CM

## 2013-08-31 NOTE — Progress Notes (Signed)
PROGRESS NOTE  DATE: 08-31-13  FACILITY: Nursing Home Location: [facility]  LEVEL OF CARE: SNF (31)  Discharge Visit  CHIEF COMPLAINT:  Manage atrial fibrillation, CHF and constipation  HISTORY OF PRESENT ILLNESS: I was requested by the social worker to perform face-to-face evaluation for discharge. Patient will be transferring to a facility in [place].  REASSESSMENT OF ONGOING PROBLEM(S):  ATRIAL FIBRILLATION: the patients atrial fibrillation remains stable.  The patient denies DOE, tachycardia, orthopnea, transient neurological sx, pedal edema, palpitations, & PNDs.  No complications noted from the medications currently being used.  CHF:The patient does not relate significant weight changes, denies sob, DOE, orthopnea, PNDs, pedal edema, palpitations or chest pain.  CHF remains stable.  No complications form the medications being used.  CONSTIPATION: The constipation remains stable. No complications from the medications presently being used. Patient denies ongoing constipation, abdominal pain, nausea or vomiting.  PAST MEDICAL HISTORY : Reviewed.  No changes.  CURRENT MEDICATIONS: Reviewed per [facility]  REVIEW OF SYSTEMS:  GENERAL: no change in appetite, no fatigue, no weight changes, no fever, chills or weakness RESPIRATORY: no cough, SOB, DOE, wheezing, hemoptysis CARDIAC: no chest pain, edema or palpitations GI: no abdominal pain, diarrhea, constipation, heart burn, nausea or vomiting  PHYSICAL EXAMINATION  VS:  T 96.2     P 68     RR 18      BP 116/79     POX %     WT (Lb) 169.6  GENERAL: no acute distress, obese body habitus EYES: conjunctivae normal, sclerae normal, normal eye lids NECK: supple, trachea midline, no neck masses, no thyroid tenderness, no thyromegaly LYMPHATICS: no LAN in the neck, no supraclavicular LAN RESPIRATORY: breathing is even & unlabored, BS CTAB CARDIAC: RRR, no murmur,no extra heart sounds, +1 bilateral lower  extremity edema GI: abdomen soft, normal BS, no masses, no tenderness, no hepatomegaly, no splenomegaly PSYCHIATRIC: the patient is alert & oriented to person, affect & behavior appropriate  LABS/RADIOLOGY:  2/14 CBC normal, BUN 35, creatinine 5.04, alkaline phosphatase 122 otherwise CMP normal  ASSESSMENT/PLAN:  Atrial fibrillation-rate controlled. CHF-well compensated. Constipation-well controlled. Left knee osteoarthritis-on Lidoderm patch. End-stage renal disease-on hemodialysis  Patient will be transferring today. I have filled out her discharge paper work.  CPT CODE: 16109

## 2013-09-08 ENCOUNTER — Inpatient Hospital Stay
Admission: RE | Admit: 2013-09-08 | Discharge: 2013-09-08 | Disposition: A | Discharge status: Home / Self Care | Payer: Medicare Other | Source: Ambulatory Visit | Attending: Otolaryngology | Admitting: Otolaryngology

## 2013-09-08 ENCOUNTER — Inpatient Hospital Stay: Admission: RE | Admit: 2013-09-08 | Payer: Medicare Other | Source: Ambulatory Visit

## 2013-09-13 ENCOUNTER — Other Ambulatory Visit: Payer: Medicare Other

## 2013-09-15 DIAGNOSIS — H1045 Other chronic allergic conjunctivitis: Secondary | ICD-10-CM | POA: Insufficient documentation

## 2013-09-15 NOTE — Progress Notes (Signed)
Patient ID: Desiree Pierce, female   DOB: Oct 08, 1931, 79 y.o.   MRN: [id number]        PROGRESS NOTE  DATE: 08/16/2013  FACILITY:  [facility]  LEVEL OF CARE: SNF (31)  Acute Visit  CHIEF COMPLAINT:  Manage left eye redness and swelling.    HISTORY OF PRESENT ILLNESS: I was requested by the staff to assess the patient regarding above problem(s):  Daughter is concerned that patient's left eye has redness and swelling.  Per patient, she has allergies and she is having itching and tearing, but denies any visual disturbances.    PAST MEDICAL HISTORY : Reviewed.  No changes.  CURRENT MEDICATIONS: Reviewed per [facility]  REVIEW OF SYSTEMS:  GENERAL: no change in appetite, no fatigue, no weight changes, no fever, chills or weakness RESPIRATORY: no cough, SOB, DOE,, wheezing, hemoptysis CARDIAC: no chest pain, edema or palpitations GI: no abdominal pain, diarrhea, constipation, heart burn, nausea or vomiting  PHYSICAL EXAMINATION  GENERAL: no acute distress, normal body habitus EYES: left eye has conjunctival erythema and periorbital edema, right eye has mild conjunctival erythema and mild periorbital edema   NECK: supple, trachea midline, no neck masses, no thyroid tenderness, no thyromegaly RESPIRATORY: breathing is even & unlabored, BS CTAB CARDIAC: RRR, no murmur,no extra heart sounds, no edema GI: abdomen soft, normal BS, no masses, no tenderness, no hepatomegaly, no splenomegaly  ASSESSMENT/PLAN:  Allergic conjunctivitis.  New problem.  Start Patanol 0.1%, 1 drop to each eye b.i.d. for one month.    CPT CODE: 40981

## 2013-09-19 NOTE — Progress Notes (Signed)
Patient ID: Desiree Pierce, female   DOB: [birth year], 79 y.o.   MRN: [id number]        PROGRESS NOTE  DATE: 08/23/2013  FACILITY:  [facility]  LEVEL OF CARE: SNF (31)  Acute Visit  CHIEF COMPLAINT:  Manage anticoagulation.    HISTORY OF PRESENT ILLNESS: I was requested by the staff to assess the patient regarding above problem(s):  ATRIAL FIBRILLATION: the patients atrial fibrillation remains stable.  The patient denies DOE, tachycardia, orthopnea, transient neurological sx, pedal edema, palpitations, & PNDs.  No complications noted from the medications currently being used.    ANTICOAGULATION: Patient is currently on coumadin and does not report any bleeding. INR on 08/17/2013 was 1.5.  Patient was on Coumadin 2 mg q.d.    PAST MEDICAL HISTORY : Reviewed.  No changes.  CURRENT MEDICATIONS: Reviewed per [facility]  REVIEW OF SYSTEMS:  GENERAL: no change in appetite, no fatigue, no weight changes, no fever, chills or weakness RESPIRATORY: no cough, SOB, DOE,, wheezing, hemoptysis CARDIAC: no chest pain, edema or palpitations GI: no abdominal pain, diarrhea, constipation, heart burn, nausea or vomiting  PHYSICAL EXAMINATION  GENERAL: no acute distress, moderately obese body habitus NECK: supple, trachea midline, no neck masses, no thyroid tenderness, no thyromegaly RESPIRATORY: breathing is even & unlabored, BS CTAB CARDIAC: heart rate is irregular irregular, no murmur,no extra heart sounds EDEMA/VARICOSITIES:  +1 bilateral lower extremity edema  ARTERIAL:  pedal pulses diminished   GI: abdomen soft, normal BS, no masses, no tenderness, no hepatomegaly, no splenomegaly PSYCHIATRIC: the patient is alert & oriented to person, affect & behavior appropriate  ASSESSMENT/PLAN:  Atrial fibrillation.  Rate controlled.    V58.61.  INR is subtherapeutic and unstable.  Coumadin increased to 3 mg q.d.   Recheck INR pending.    CPT CODE: 40981

## 2013-10-10 ENCOUNTER — Non-Acute Institutional Stay (SKILLED_NURSING_FACILITY): Payer: Medicare Other | Admitting: Internal Medicine

## 2013-10-10 DIAGNOSIS — Z7901 Long term (current) use of anticoagulants: Secondary | ICD-10-CM

## 2013-10-10 DIAGNOSIS — I4891 Unspecified atrial fibrillation: Secondary | ICD-10-CM

## 2013-10-10 DIAGNOSIS — K5900 Constipation, unspecified: Secondary | ICD-10-CM

## 2013-10-10 DIAGNOSIS — I509 Heart failure, unspecified: Secondary | ICD-10-CM

## 2013-10-10 NOTE — Progress Notes (Signed)
PROGRESS NOTE  DATE: 10-10-13  FACILITY: Nursing Home Location: [facility]  LEVEL OF CARE: SNF (31)  Routine Visit  CHIEF COMPLAINT:  Manage atrial fibrillation, anticoagulation, CHF and constipation  HISTORY OF PRESENT ILLNESS:  Pt did not get discharged last month as scheduled.  She is still awaiting a bed in SNF in a facility in [place].  REASSESSMENT OF ONGOING PROBLEM(S):  ATRIAL FIBRILLATION: the patients atrial fibrillation remains stable.  The patient denies DOE, tachycardia, orthopnea, transient neurological sx, pedal edema, palpitations, & PNDs.  No complications noted from the medications currently being used.  CHF:The patient does not relate significant weight changes, denies sob, DOE, orthopnea, PNDs, pedal edema, palpitations or chest pain.  CHF remains stable.  No complications form the medications being used.  CONSTIPATION: The constipation remains stable. No complications from the medications presently being used. Patient denies ongoing constipation, abdominal pain, nausea or vomiting.  ANTICOAGULATION: Patient is currently on coumadin and does not report any bleeding. INR on 10-10-13 is 2.3.  PAST MEDICAL HISTORY : Reviewed.  No changes.  CURRENT MEDICATIONS: Reviewed per [facility]  REVIEW OF SYSTEMS:  GENERAL: no change in appetite, no fatigue, no weight changes, no fever, chills or weakness RESPIRATORY: no cough, SOB, DOE, wheezing, hemoptysis CARDIAC: no chest pain, edema or palpitations GI: no abdominal pain, diarrhea, constipation, heart burn, nausea or vomiting  PHYSICAL EXAMINATION  VS:  T 97.2     P 112     RR 20      BP 105/69     POX %     WT (Lb) 168.7  GENERAL: no acute distress, obese body habitus EYES: conjunctivae normal, sclerae normal, normal eye lids NECK: supple, trachea midline, no neck masses, no thyroid tenderness, no thyromegaly LYMPHATICS: no LAN in the neck, no supraclavicular LAN RESPIRATORY: breathing  is even & unlabored, BS CTAB CARDIAC: RRR, no murmur,no extra heart sounds, +1 bilateral lower extremity edema GI: abdomen soft, normal BS, no masses, no tenderness, no hepatomegaly, no splenomegaly PSYCHIATRIC: the patient is alert & oriented to person, affect & behavior appropriate  LABS/RADIOLOGY:  2/14 CBC normal, BUN 35, creatinine 5.04, alkaline phosphatase 122 otherwise CMP normal  ASSESSMENT/PLAN:  Atrial fibrillation-rate controlled. CHF-well compensated. Constipation-well controlled. V58.61-INR is therapeutic & stable.  Cont. Current coumadin dose.  Check INR in 1 wk. Left knee osteoarthritis-on Lidoderm patch. End-stage renal disease-on hemodialysis Check cbc & cmp if pt is not discharged.   CPT CODE: 16109

## 2013-10-25 NOTE — Progress Notes (Signed)
Patient ID: Desiree Pierce, female   DOB: 13-Oct-1931, 79 y.o.   MRN: [id number]  [facility]  No Known Allergies  Chief Complaint  Patient presents with  . Medical Managment of Chronic Issues    HPI She is being seen for the management of her chronic illnesses. Overall her status remains without change. She is not voicing any concerns at this time. Her inr today is 4.4 and she is taking coumadin 4 mg daily; she will require adjustment on her medications.   Past Medical History  Diagnosis Date  . Morbid obesity   . Arthritis   . Allergy history unknown   . CHF (congestive heart failure)   . Pulmonary hypertension   . GERD (gastroesophageal reflux disease)   . COPD (chronic obstructive pulmonary disease)   . OA (osteoarthritis)   . Solitary kidney, congenital   . Hypertension     Poorly controlled, DR. Dasanayaka  . Atrial fibrillation     Past Surgical History  Procedure Laterality Date  . Total abdominal hysterectomy    . Total knee arthroplasty      Right knee  . Ankle fracture surgery      Right ankle repair  . Insertion of dialysis catheter  12/16/2012    Procedure: INSERTION OF DIALYSIS CATHETER;  Surgeon: Nada Libman, MD;  Location: [facility] OR;  Service: Vascular;  Laterality: Left;  . Av fistula placement  12/16/2012    Procedure: ARTERIOVENOUS (AV) FISTULA CREATION;  Surgeon: Nada Libman, MD;  Location: [facility] OR;  Service: Vascular;  Laterality: Left;  . Insertion of dialysis catheter  12/18/2012    Procedure: INSERTION OF DIALYSIS CATHETER;  Surgeon: Nada Libman, MD;  Location: [facility] OR;  Service: Vascular;  Laterality: Right;  right IJ hemodialysis catheter insertion. removal of left IJ hemodialysis catheter  . Av fistula placement Left 02/15/2013    Procedure: ARTERIOVENOUS (AV) FISTULA CREATION;  Surgeon: Sherren Kerns, MD;  Location: [facility] OR;  Service: Vascular;  Laterality: Left;  . Ligation of arteriovenous  fistula Left 02/15/2013    Procedure: LIGATION OF  ARTERIOVENOUS  FISTULA;  Surgeon: Sherren Kerns, MD;  Location: [facility] OR;  Service: Vascular;  Laterality: Left;    Filed Vitals:   06/27/13 1147  BP: 136/89  Pulse: 100  Height: 5\' 2"  (1.575 m)  Weight: 166 lb 1.6 oz (75.342 kg)    MEDICATIONS  renagel 800 mg tid zofran 4 mg every 4 hours as needed Simethicone 800 mg every 6 hours as needed Ultram 50 mg twice daily as needed Senna s 3 tabs twice daily miralax daily lidoderm patch to left knee Diltiazem 120 mg daily nephrovite daily Coreg 6.25 mg twice daily Coumadin 4 mg daily   SIGNIFICANT DIAGNOSTIC EXAMS  01-13-13: left foot x-ray: mild osteoporosis; mild degenerative disease; no acute fracture or dislocation   Lab reviewed 01-27-13: wbc 6.1; hgb 13.0; hct 40.4; mcv 89.4; plt 165; glucose 83; bun 35; creat 5.04; k+ 4.8 Na++ 140; alk phos 122; ast 20 alt 9; albumin 4.0    Review of Systems  Constitutional: Negative for malaise/fatigue.  Respiratory: Negative for cough and shortness of breath.   Cardiovascular: Negative for chest pain and leg swelling.  Gastrointestinal: Negative for heartburn, abdominal pain and constipation.  Musculoskeletal: Negative for myalgias and joint pain.  Skin: Negative.   Neurological: Negative for headaches.  Psychiatric/Behavioral: Negative for depression. The patient does not have insomnia.      Physical Exam  Constitutional: She appears well-developed  and well-nourished.  overweight  Neck: Neck supple. No JVD present. No thyromegaly present.  Cardiovascular: Normal rate and regular rhythm.   Respiratory: Effort normal and breath sounds normal. No respiratory distress.  GI: Soft. Bowel sounds are normal. She exhibits no distension. There is no tenderness.  Musculoskeletal: She exhibits no edema.  Uses wheelchair is able to move extremities  Neurological: She is alert.  Skin: Skin is warm and dry.       ASSESSMENT/ PLAN:  HYPERTENSION Is stable will continue coreg 6.25  mg on non-dialysis days; cardizem cd 120 mg daily and will monitor  End stage renal disease Is on hemodialysis three days per week; will continue renavite daly 1200 cc fluid restriction; renagel 800 mg three times daily and will monitor status  Long term (current) use of anticoagulants Will hold her coumadin today will then being 3.5 mg daily and will check inr in one week.   Unspecified constipation Stable will continue miralax daily and senna s 3 tabs twice daily   AFib Will continue her current plan of care and will continue to monitor her status

## 2014-01-24 ENCOUNTER — Encounter (HOSPITAL_COMMUNITY): Payer: Self-pay | Admitting: Cardiology

## 2014-01-24 ENCOUNTER — Telehealth (HOSPITAL_COMMUNITY): Payer: Self-pay | Admitting: Cardiology

## 2014-01-24 NOTE — Telephone Encounter (Signed)
Attempting to contact pt regarding 6 month follow up. I have been unable to reach this patient by phone.  A letter is being sent to the last known home address.  

## 2014-04-08 IMAGING — CT CT NECK W/O CM
4 of 5 series · 15 of 33 positions shown, 18 images · non-contrast
Comparison: Chest radiographs 12/18/2012 and earlier.

CLINICAL DATA: 82-year-old female with new left vocal cord
paralysis of unknown etiology. Dialysis patient. Unable to obtain IV
access.

EXAM:
CT NECK WITHOUT CONTRAST
TECHNIQUE: Multidetector CT imaging of the neck was performed following the
standard protocol without intravenous contrast.

[Series 2: axial neck · axial · 0.43mm/px · z∈[-248,-190]mm · 2 of 118 slices shown]
[im 24/118  bone]
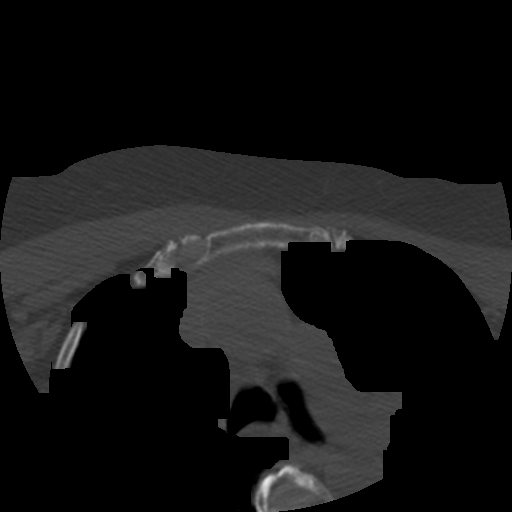
[im 47/118  bone]
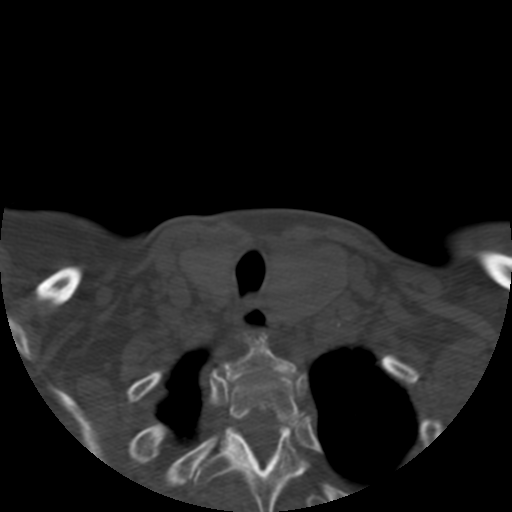

[Series 400: cor w/o · coronal · non-contrast · 0.59mm/px · 3 of 105 slices shown]
[im 21/105  bone]
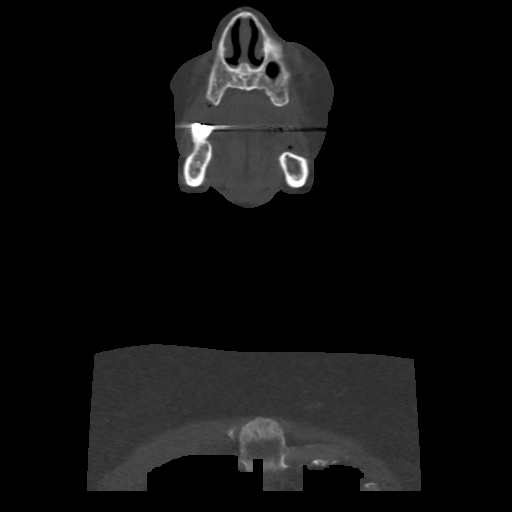
[im 42/105  bone]
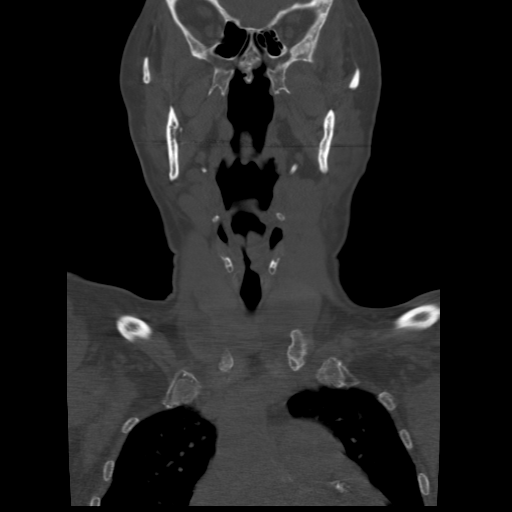
[im 63/105  bone]
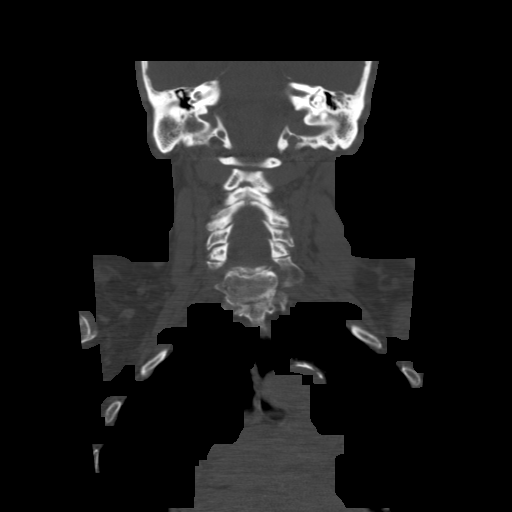

[Series 401: sag w/o · sagittal · non-contrast · 0.59mm/px · 5 of 106 slices shown, 6 images]
[im 36/106  bone]
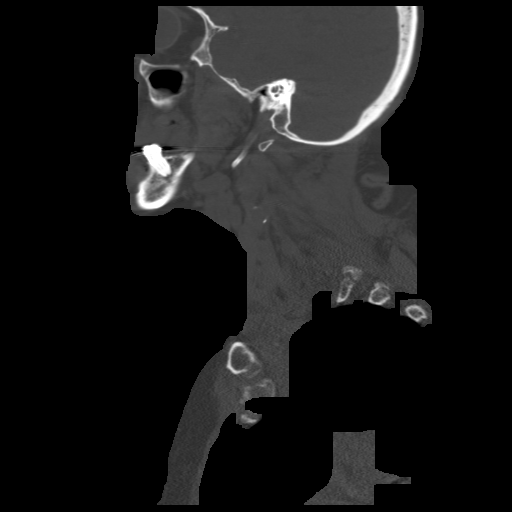
[im 44/106  bone]
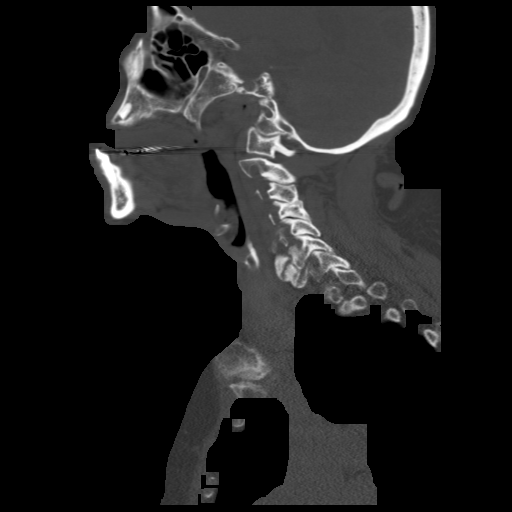
[im 53/106  soft-tissue]
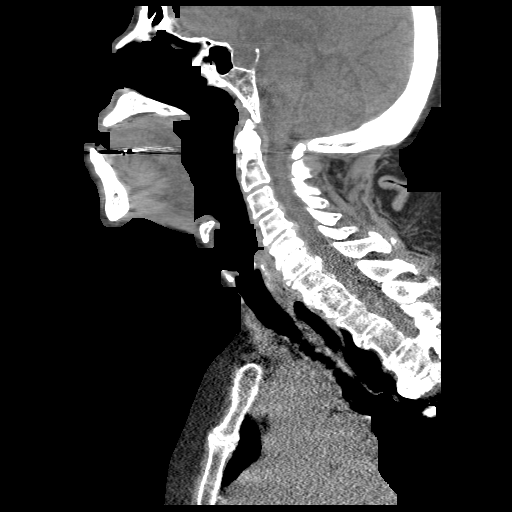
[im 53/106  bone]
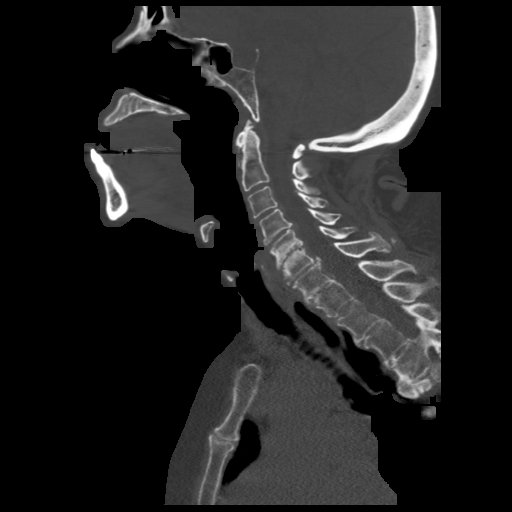
[im 62/106  bone]
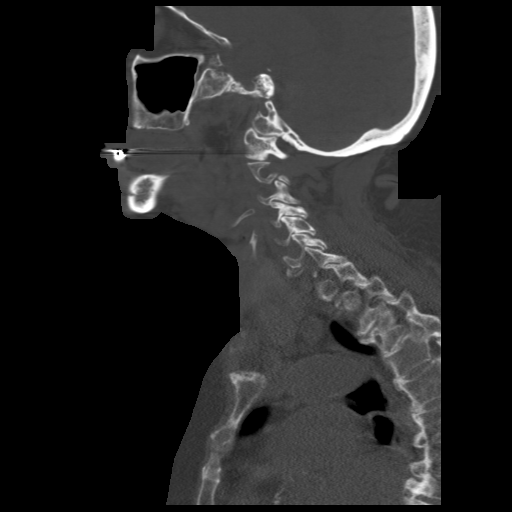
[im 71/106  bone]
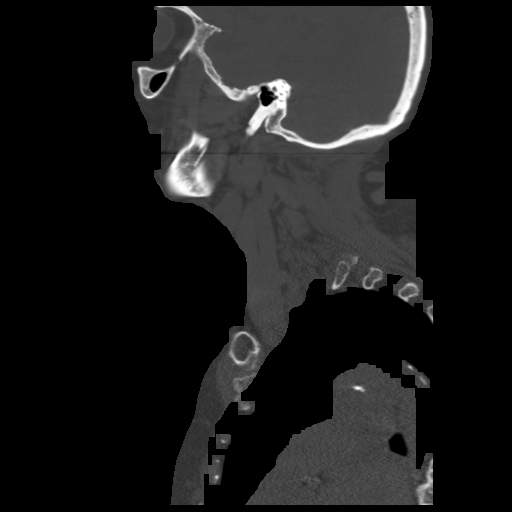

[Series 402: axial w/o · axial · non-contrast · 0.43mm/px · z∈[-274,-81]mm · 5 of 149 slices shown, 7 images]
[im 25/149  soft-tissue]
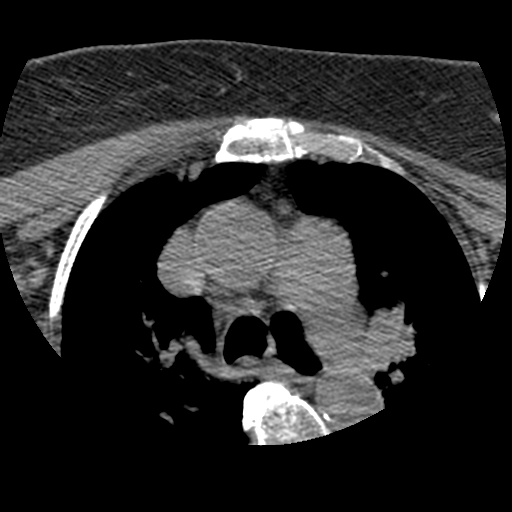
[im 25/149  bone]
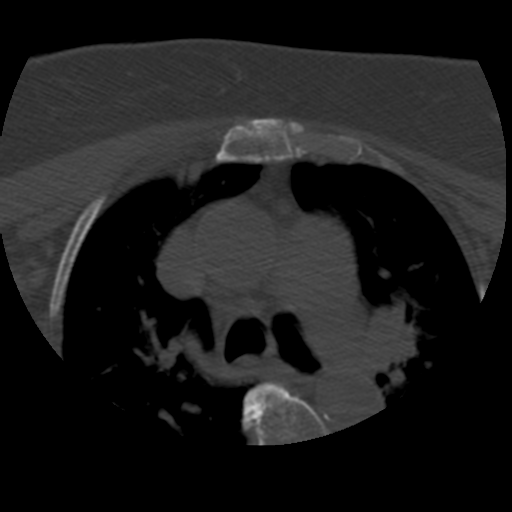
[im 50/149  bone]
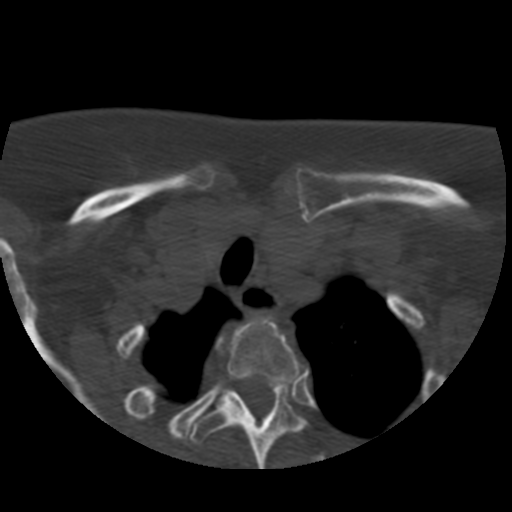
[im 75/149  bone]
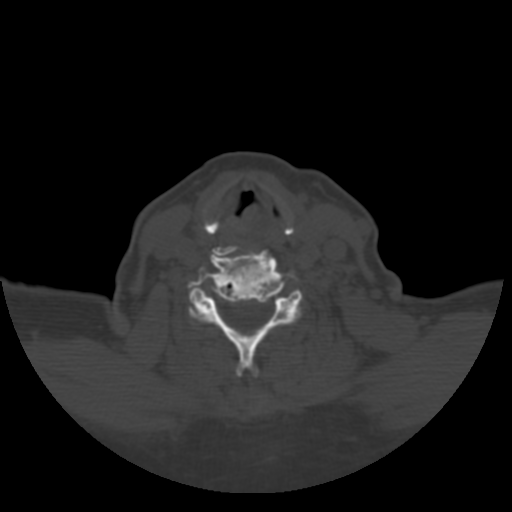
[im 99/149  bone]
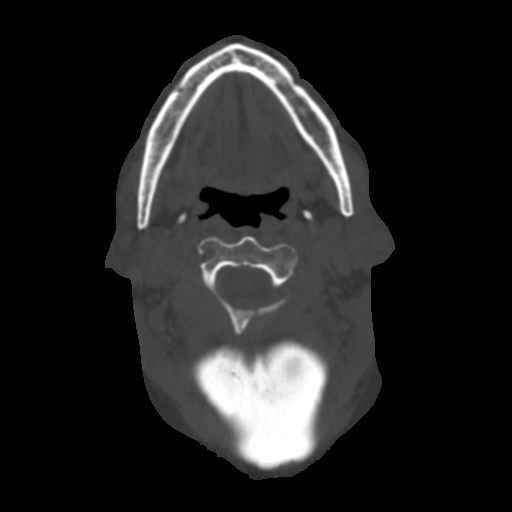
[im 124/149  soft-tissue]
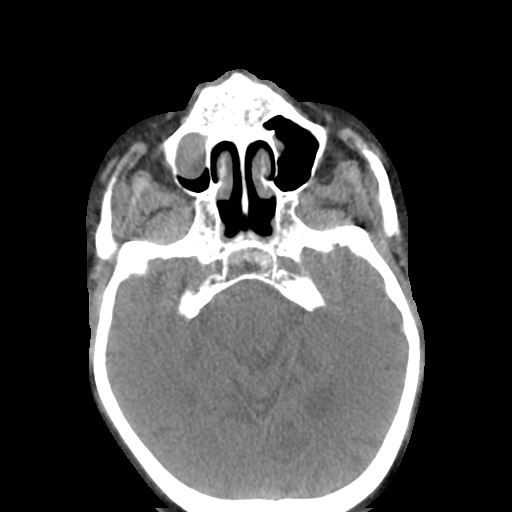
[im 124/149  bone]
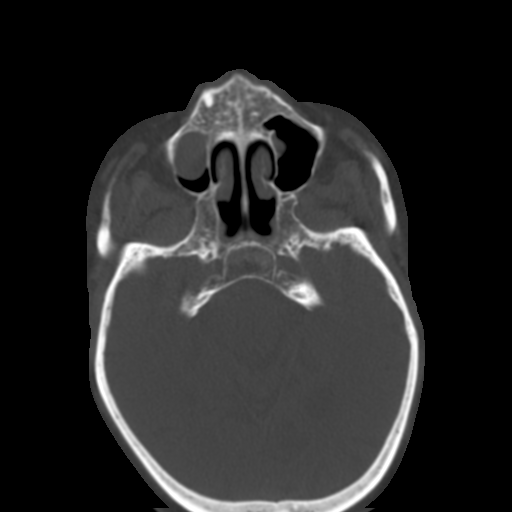

[15 of 33 positions shown; findings below may reference images not displayed]

FINDINGS: No evidence of superior mediastinal mass or lymphadenopathy,
including in the aorticopulmonary window. Intermittent Mild gaseous
distension of the cervical esophagus, significance doubtful.

Thyromegaly. Left thyroid lobe heterogeneity suggesting a central
nodule within the lobe measuring at least 25 mm. No level for a or
thoracic inlet lymphadenopathy identified. No mass effect on the
trachea.

Medial displacement of the left vocal fold and other secondary
findings of paralysis including asymmetric enlargement of the left
laryngeal ventricle and piriform sinus. No laryngeal mass or other
abnormality identified.

Negative non contrast pharynx, parapharyngeal spaces,
retropharyngeal space, sublingual space, submandibular glands,
parotid glands, orbits soft tissues, and visualized brain
parenchyma.

No cervical lymphadenopathy.

Maxillary sinus mucous retention cysts greater on the right. Other
Visualized paranasal sinuses and mastoids are clear. Degenerative
changes in the cervical spine, with advanced C5-C6 disc and endplate
degeneration. Scoliosis and degenerative osseous changes in the
spine. No acute osseous abnormality identified.

Calcified atherosclerosis at the skull base. Mild carotid
bifurcation calcified atherosclerosis. Tortuous right common carotid
artery. Coronary artery calcified atherosclerosis. Cardiomegaly
suspected.
IMPRESSION: 1. Findings of left vocal cord paralysis, but no neck or upper
mediastinal/chest mass or etiology identified. No cervical
lymphadenopathy. See #2.

2. Thyromegaly with evidence of a left thyroid nodule measuring at
least 25 mm diameter. Benign multinodular goiter is favored in this
age group and clinical setting. Further evaluation with thyroid
ultrasound could be considered. If the patient is clinically
hyperthyroid, consider nuclear medicine thyroid scan.

## 2014-11-30 ENCOUNTER — Encounter (HOSPITAL_COMMUNITY): Payer: Self-pay | Admitting: Internal Medicine

## 2016-11-21 DEATH — deceased
